# Patient Record
Sex: Female | Born: 1970
Health system: Southern US, Community
[De-identification: ages and names within clinical notes are randomized; demographics above are authoritative.]

## PROBLEM LIST (undated history)

## (undated) ENCOUNTER — Ambulatory Visit: Admission: EM | Payer: BC Managed Care – PPO

## (undated) DIAGNOSIS — E785 Hyperlipidemia, unspecified: Secondary | ICD-10-CM

## (undated) DIAGNOSIS — U071 COVID-19: Secondary | ICD-10-CM

## (undated) DIAGNOSIS — I10 Essential (primary) hypertension: Secondary | ICD-10-CM

## (undated) DIAGNOSIS — J189 Pneumonia, unspecified organism: Secondary | ICD-10-CM

## (undated) DIAGNOSIS — E119 Type 2 diabetes mellitus without complications: Secondary | ICD-10-CM

## (undated) DIAGNOSIS — K859 Acute pancreatitis without necrosis or infection, unspecified: Secondary | ICD-10-CM

## (undated) DIAGNOSIS — E079 Disorder of thyroid, unspecified: Secondary | ICD-10-CM

## (undated) DIAGNOSIS — K8020 Calculus of gallbladder without cholecystitis without obstruction: Secondary | ICD-10-CM

## (undated) DIAGNOSIS — H269 Unspecified cataract: Secondary | ICD-10-CM

## (undated) DIAGNOSIS — M1990 Unspecified osteoarthritis, unspecified site: Secondary | ICD-10-CM

## (undated) DIAGNOSIS — IMO0002 Reserved for concepts with insufficient information to code with codable children: Secondary | ICD-10-CM

## (undated) DIAGNOSIS — T7840XA Allergy, unspecified, initial encounter: Secondary | ICD-10-CM

## (undated) HISTORY — PX: CHOLECYSTECTOMY: SHX55

## (undated) HISTORY — DX: Allergy, unspecified, initial encounter: T78.40XA

## (undated) HISTORY — DX: Essential (primary) hypertension: I10

## (undated) HISTORY — DX: Unspecified osteoarthritis, unspecified site: M19.90

## (undated) HISTORY — DX: Hyperlipidemia, unspecified: E78.5

## (undated) HISTORY — DX: Disorder of thyroid, unspecified: E07.9

## (undated) HISTORY — PX: CERVICAL SPINE SURGERY: SHX589

## (undated) HISTORY — DX: Calculus of gallbladder without cholecystitis without obstruction: K80.20

## (undated) HISTORY — DX: Unspecified cataract: H26.9

## (undated) HISTORY — DX: Type 2 diabetes mellitus without complications: E11.9

## (undated) HISTORY — PX: HIP ARTHROSCOPY: SUR88

## (undated) HISTORY — DX: Acute pancreatitis without necrosis or infection, unspecified: K85.90

## (undated) HISTORY — DX: Pneumonia, unspecified organism: J18.9

## (undated) HISTORY — DX: Reserved for concepts with insufficient information to code with codable children: IMO0002

---
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
## 2000-11-21 ENCOUNTER — Other Ambulatory Visit: Admission: RE | Admit: 2000-11-21 | Discharge: 2000-11-21 | Payer: Self-pay | Admitting: Obstetrics and Gynecology

## 2002-01-23 ENCOUNTER — Other Ambulatory Visit: Admission: RE | Admit: 2002-01-23 | Discharge: 2002-01-23 | Payer: Self-pay | Admitting: Obstetrics and Gynecology

## 2003-04-04 ENCOUNTER — Other Ambulatory Visit: Admission: RE | Admit: 2003-04-04 | Discharge: 2003-04-04 | Payer: Self-pay | Admitting: Obstetrics and Gynecology

## 2003-09-20 HISTORY — PX: CHOLECYSTECTOMY: SHX55

## 2004-04-11 ENCOUNTER — Emergency Department (HOSPITAL_COMMUNITY): Admission: EM | Admit: 2004-04-11 | Discharge: 2004-04-11 | Payer: Self-pay | Admitting: Emergency Medicine

## 2004-04-15 ENCOUNTER — Encounter (INDEPENDENT_AMBULATORY_CARE_PROVIDER_SITE_OTHER): Payer: Self-pay | Admitting: *Deleted

## 2004-04-15 ENCOUNTER — Observation Stay (HOSPITAL_COMMUNITY): Admission: RE | Admit: 2004-04-15 | Discharge: 2004-04-15 | Payer: Self-pay | Admitting: General Surgery

## 2004-05-26 ENCOUNTER — Other Ambulatory Visit: Admission: RE | Admit: 2004-05-26 | Discharge: 2004-05-26 | Payer: Self-pay | Admitting: Obstetrics and Gynecology

## 2005-07-29 ENCOUNTER — Other Ambulatory Visit: Admission: RE | Admit: 2005-07-29 | Discharge: 2005-07-29 | Payer: Self-pay | Admitting: Obstetrics and Gynecology

## 2006-06-27 ENCOUNTER — Ambulatory Visit: Payer: Self-pay | Admitting: Cardiology

## 2006-07-14 ENCOUNTER — Encounter: Payer: Self-pay | Admitting: Internal Medicine

## 2006-07-14 ENCOUNTER — Ambulatory Visit: Payer: Self-pay

## 2006-07-14 ENCOUNTER — Ambulatory Visit: Payer: Self-pay | Admitting: Cardiology

## 2006-08-09 ENCOUNTER — Ambulatory Visit: Payer: Self-pay | Admitting: Cardiology

## 2006-09-21 ENCOUNTER — Encounter: Admission: RE | Admit: 2006-09-21 | Discharge: 2006-09-21 | Payer: Self-pay | Admitting: Obstetrics and Gynecology

## 2006-09-28 ENCOUNTER — Ambulatory Visit: Payer: Self-pay | Admitting: Cardiology

## 2006-11-13 ENCOUNTER — Inpatient Hospital Stay (HOSPITAL_COMMUNITY): Admission: AD | Admit: 2006-11-13 | Discharge: 2006-11-16 | Payer: Self-pay | Admitting: Obstetrics and Gynecology

## 2006-11-17 ENCOUNTER — Encounter: Admission: RE | Admit: 2006-11-17 | Discharge: 2006-12-15 | Payer: Self-pay | Admitting: Obstetrics and Gynecology

## 2006-12-16 ENCOUNTER — Encounter: Admission: RE | Admit: 2006-12-16 | Discharge: 2007-01-11 | Payer: Self-pay | Admitting: Obstetrics and Gynecology

## 2007-03-09 ENCOUNTER — Ambulatory Visit: Payer: Self-pay | Admitting: Cardiology

## 2009-06-02 ENCOUNTER — Encounter: Admission: RE | Admit: 2009-06-02 | Discharge: 2009-06-02 | Payer: Self-pay | Admitting: Obstetrics and Gynecology

## 2009-11-20 ENCOUNTER — Encounter: Admission: RE | Admit: 2009-11-20 | Discharge: 2009-11-20 | Payer: Self-pay | Admitting: Orthopedic Surgery

## 2010-09-01 ENCOUNTER — Encounter
Admission: RE | Admit: 2010-09-01 | Discharge: 2010-09-01 | Payer: Self-pay | Source: Home / Self Care | Attending: Internal Medicine | Admitting: Internal Medicine

## 2011-02-01 NOTE — Assessment & Plan Note (Signed)
[facility]                            CARDIOLOGY OFFICE NOTE   Jessica Chang, Jessica Chang                       MRN:          [id number]  DATE:03/09/2007                            DOB:          July 12, 1971    PRIMARY CARE PHYSICIAN:  Sigmund Hazel.   REASON FOR PRESENTATION:  Evaluate patient with palpitations and  hypertension.   HISTORY OF PRESENT ILLNESS:  The patient is 40 years old.  She was  referred to me initially with  premature ventricular contractions with  hypertension.  She was pregnant at the time.  She has since delivered a  baby who is doing well after some initial complications.  She actually  has been doing okay although she is forgetting to take her methyldopa 3  times a day.  She is taking it about twice a day.  She has had no  problems with blood pressure, either during the delivery or post partum.  She is not having any palpitations.  She is exercising.  She denies any  chest pain or shortness of breath.   PAST MEDICAL HISTORY:  1. Hypertension.  2. Arthroscopic surgery.  3. Tonsillectomy.  4. Cholecystectomy.   ALLERGIES:  None.   MEDICATIONS:  1. Prenatal vitamins.  2. Methyldopa 500 mg b.i.d.  3. Nova Ring.  4. Zoloft.   REVIEW OF SYSTEMS:  As stated in the HPI and otherwise negative for  other systems.   PHYSICAL EXAMINATION:  Patient is in no distress.  Blood pressure 152/98, heart rate 94 and regular, weight 208 pounds.  HEENT:  Eyelids unremarkable, pupils are equal, round, and reactive to  light, fundi not visualized.  Oral mucosa unremarkable.  NECK:  No jugular venous distension at 45 degrees.  LUNGS:  Clear to auscultation bilaterally.  HEART:  PMI not displaced or sustained.  S1 and S2 are within normal  limits.  No S3, no S4.  No clicks, no rubs, no murmurs.  ABDOMEN:  Flat, positive bowel sounds, normal in frequency and pitch.  No bruits, no rebound, no guarding.  No midline pulsatile mass.  No  organomegaly.  SKIN:  No rashes, no nodules.  EXTREMITIES:  2+ pulses, no edema.   EKG sinus rhythm, leftward axis, intervals within normal limits, no  acute ST-wave changes.   ASSESSMENT AND PLAN:  1. Hypertension:  Blood pressure can be controlled now back with her      HCT/Lisinopril 20/12.5.  She will discontinue the methyldopa.  She      tolerated this medicine well before and it worked.  She is not      going to get pregnant and understands that she should not while      taking this drug.  2. Palpitations:  She is not having any further significant symptoms.      These will be treated symptomatically.  3. Followup:  She can follow up with her primary care doctor.  She is      invited to come back with any specific questions at any time.     Rollene Rotunda, MD, [facility]  Electronically Signed  JH/MedQ  DD: 03/09/2007  DT: 03/09/2007  Job #: [id number]   cc:   Sigmund Hazel, M.D.

## 2011-02-04 NOTE — Assessment & Plan Note (Signed)
[facility]                            CARDIOLOGY OFFICE NOTE   ANALUCIA, HUSH                       MRN:          [id number]  DATE:09/28/2006                            DOB:          07/19/1971    The primary is Zelphia Cairo, MD   REASON FOR PRESENTATION:  Evaluate patient with palpitations and  hypertension.   HISTORY OF PRESENT ILLNESS:  The patient is now in her 30th week of her  intrauterine pregnancy.  She has had some problems with hypertension.  She had a blood pressure cuff.  Today we compared it and they have run a  little bit high compared to our manual readings.  Reviewing her  readings, she has typically 150s or 140s over 80s or 90s.  She has heart  rates of 100 or so but no significant sustained tachypalpitations.  She  is feeling the palpitations occasionally but otherwise feels relatively  well.  She has had some lower extremity edema.  She has had some dyspnea  with exertion.  She has had no chest pain, presyncope or syncope.  She  did have a renal ultrasound that demonstrated no significant  abnormalities with questionable mild mid-renal artery stenosis.  Her EF  was normal on echocardiogram and there were no significant  abnormalities.  She wore a Holter demonstrating normal sinus rhythm with  sinus tachycardia and PACs.   PAST MEDICAL HISTORY:  Arthroscopic surgery, tonsillectomy,  cholecystectomy, hypertension.   ALLERGIES:  None.   MEDICATIONS:  Prenatal vitamin, methyldopa 500 mg t.i.d.   REVIEW OF SYSTEMS:  As stated in the HPI and otherwise negative for  other systems.   PHYSICAL EXAMINATION:  GENERAL:  The patient is in no distress.  VITAL SIGNS:  Blood pressure 134/89, heart rate 84 and regular, weight  221 pounds.  HEENT:  Eyelids unremarkable.  Pupils equal, round, and reactive to  light.  Fundi not visualized.  Oral mucosa unremarkable.  NECK:  No jugular venous distention at 45 degrees.  Carotid  upstroke  brisk and symmetric, no bruits, no thyromegaly.  LYMPHATIC:  No adenopathy.  LUNGS:  Clear to auscultation bilaterally.  BACK:  No costovertebral angle tenderness.  CHEST:  Unremarkable.  HEART:  PMI not displaced or sustained, S1 and S2 within normal limits,  no S3, no S4, no murmurs.  ABDOMEN:  Gravid, positive bowel sounds, normal in frequency and pitch.  No bruits, rebound, guarding, no midline pulsatile mass, no  organomegaly.  SKIN:  No rashes, no nodules.  EXTREMITIES:  2+ pulses, moderate bilateral lower extremity edema, no  calf swelling or tenderness.  NEUROLOGIC:  Grossly intact throughout.   ASSESSMENT AND PLAN:  1. Hypertension.  The patient's blood pressure is mildly elevated.      However, I do not think we need further treatment of this at this      level.  It is overestimated by her home blood pressure cuff and is      within acceptable limits here in the office and at a recent visit      at another office.  Therefore,  she will continue the methyldopa.  2. Palpitations.  These are not particularly symptomatic.  These can      be followed clinically.  3. Lower extremity swelling.  We discussed keeping her feet elevated      and compression stockings, which she is wearing.  We discussed salt      and fluid restriction.  4. Follow-up.  I would like to see her after her delivery to see if      her blood pressure comes down and maybe reevaluate her renals in      another way when she is not pregnant.  Would also be available if      she has any problems prior to delivery or at the time of delivery.     Rollene Rotunda, MD, [facility]  Electronically Signed    JH/MedQ  DD: 09/28/2006  DT: 09/29/2006  Job #: [id number]   cc:   Zelphia Cairo, MD

## 2011-02-04 NOTE — Discharge Summary (Signed)
NAME:  Jessica Chang, Jessica Chang NO.:  [id number]   MEDICAL RECORD NO.:  [id number]          PATIENT TYPE:  INP   LOCATION:  9131                          FACILITY:  [facility]   PHYSICIAN:  Freddy Finner, M.D.   DATE OF BIRTH:  Feb 15, 1971   DATE OF ADMISSION:  11/13/2006  DATE OF DISCHARGE:  11/16/2006                               DISCHARGE SUMMARY   ADMITTING DIAGNOSES:  1. Intrauterine pregnancy at 37 weeks estimated gestational age.  2. Induction of labor secondary to chronic hypertension, gestational      diabetes and oligohydramnios.   DISCHARGE DIAGNOSES:  1. Status post low transverse cesarean section secondary to failure to      progress.  2. Viable female infant.   PROCEDURE:  Primary low transverse cesarean section.   REASON FOR ADMISSION:  Please see dictated H and P.   HOSPITAL COURSE:  The patient is a 40 year old female that was admitted  to [facility] for induction of labor secondary to  chronic hypertension, gestational diabetes and oligohydramnios.  PIH  labs were drawn on admission which were within normal limits.  Blood  pressure was 150/100.  Fetal heart tones were reactive.  On the  following morning cervix was reexamined and found to be 2 cm, 80%  effaced, vertex at a -1 station.  Artificial rupture of membranes was  performed which revealed no fluid.  The patient continued on Pitocin and  ampicillin for coverage for group B beta strep.  Epidural was placed for  the patient's comfort.  Later that evening the patient was in adequate  labor pattern.  However, cervix was unchanged.  Decision was made to  proceed with a primary low transverse cesarean section.  The patient was  then transferred to the operating room where epidural was dosed to an  adequate surgical level.  A low transverse incision was made with  delivery of a viable female infant weighing 7 pounds 5 ounces, Apgar's  of 7 at 1 minute and 8 at 5 minutes.  The patient  tolerated the  procedure well and was taken to the recovery room in stable condition.  Postoperative day #1 the patient was without complaint.  Baby was in the  NICU for sugar control.  Vital signs were stable.  She was afebrile.  Blood pressure 137/86.  Abdomen soft with good return of bowel function.  Fundus was firm and nontender.  Abdominal dressing was noted to be  clean, dry and intact.  Laboratory findings revealed hemoglobin of 9.1,  platelet count of 177,000, WBC count of 12.8.  Fasting blood sugar was  178 mg/dL, however, the patient had eaten several hours prior to fasting  blood sugar.  On the following morning, the patient was without  complaint other than she had noticed some right shoulder pain without  paresthesia.  Baby continued to be in the NICU, was now being monitored  for abdominal distention.  Vital signs were stable.  Blood pressure  130/75.  Abdomen soft.  Fundus firm and nontender.  Incision was clean,  dry and intact.  Fasting blood sugar was  96 mg/dL and 2-hour  postprandial was 122 to 148.  Later that evening the infant had been  transported to the [facility] for perforated bowel and the patient  did desire early discharge.  Discharge instructions reviewed and the  patient was later discharged home.   CONDITION ON DISCHARGE:  Stable.   DIET:  Regular as tolerated.   ACTIVITY:  No heavy lifting, no driving x2 weeks, no vaginal entry.   FOLLOW UP:  Patient to follow up in 1 week for an incision check.  She  is to call for temperature greater than 100 degrees, persistent nausea,  vomiting, heavy vaginal bleeding and/or redness or drainage from  incisional site.  The patient was also instructed call for headache,  blurred vision or right upper quadrant pain.   DISCHARGE MEDICATIONS:  1. Percocet 5/325 #60 with 1 additional refill 1 p.o. every 4 hours.  2. Motrin 600 mg every 6 hours.  3. Aldomet 500 mg 1 p.o. t.i.d.  4. Xanax 0.25 1 p.o. t.i.d. #30  with 3 additional refills.  5. Prenatal vitamins 1 p.o. daily.      Julio Sicks, N.P.      Freddy Finner, M.D.  Electronically Signed    CC/MEDQ  D:  12/18/2006  T:  12/18/2006  Job:  [id number]

## 2011-02-04 NOTE — Op Note (Signed)
NAME:  Jessica, Chang NO.:  [id number]   MEDICAL RECORD NO.:  [id number]                   PATIENT TYPE:  OBV   LOCATION:  0466                                 FACILITY:  [facility]   PHYSICIAN:  Timothy E. Earlene Plater, M.D.              DATE OF BIRTH:  1971-09-17   DATE OF PROCEDURE:  04/15/2004  DATE OF DISCHARGE:                                 OPERATIVE REPORT   PREOPERATIVE DIAGNOSIS:  Cholecystolithiasis.   POSTOPERATIVE DIAGNOSIS:  Cholecystolithiasis, acute versus subacute.   OPERATIVE PROCEDURE:  Laparoscopic cholecystectomy and operative  cholangiogram.   SURGEON:  Timothy E. Earlene Plater, M.D.   ASSISTANT:  Currie Paris, M.D.   ANESTHESIA:  General.   Jessica Chang is 6, otherwise healthy.  Was in the emergency room this  weekend.  I saw her Monday.  Scheduled her surgery urgently, at her request,  for symptomatic cholecystolithiasis.  Her laboratory data is normal at this  time, and except for controlled hypertension, she is well.  She was  identified, and the permit signed.   Patient was taken to the operating room and placed supine.  General  endotracheal anesthesia administered.  PAS hose applied.  Orogastric tube.  The abdomen was prepped and draped in the usual fashion.  Marcaine 0.5% with  epinephrine was used before each incision.  An infraumbilical incision made.  The fascia identified.  Opened in the midline.  The peritoneum entered  without complication.  A suture placed.  Hasson catheter placed.  Tied in  place with the suture.  The abdomen was insufflated.  General peritoneoscopy  was unremarkable with omentum covering the entire bowel into the pelvis.  The gallbladder appeared thickened and distended.  A second 10 mm trocar  placed in the mid epigastrium and two 5 mm trocars in the right upper  quadrant.  The gallbladder was grasped, placed on tension, and indeed, there  appeared to be a stone impacted in the ampulla.  Careful  dissection at the  base of the gallbladder revealed a normal-appearing but small cystic duct.  This was dissected out completely, a window made.  The clip was placed on  the gallbladder side of the cystic duct.  The cystic duct opened.  Using the  percutaneously placed cholangiogram into the cystic duct stump, a real-time  cholangiogram was made showing complete filling of the biliary tree and free  flow into the duodenum.  We considered this normal.  The clip and catheter  were removed.  The stump and cystic duct doubly clipped and divided.  Prominent artery entering the anterior side of the gallbladder was isolated,  triply clipped and divided.  A posterior artery was likewise clipped and  divided.  The gallbladder was removed from the gallbladder bed without  incident or complication.  The bed was dry.  Irrigation was clear.  The  gallbladder was brought through the infraumbilical incision.  The stone was  quite large.  We opened a gallbladder.  There was a mucopurulent bowel  within the gallbladder.  This was quickly suctioned away.  The stone was  crushed and then delivered through the infraumbilical incision and passed  off the field from pathology.  That wound was inspected, tied under direct  vision, and then irrigated.  Irrigation of the gallbladder bed was then  cleared.  All  irrigation sealed.  Two instruments and the trocar were removed under direct  vision.  All trocar sites inspected.  Each was closed with 3-0 Monocryl,  Steri-Strips, and dry sterile dressing.  Counts were correct.  She tolerated  it well.  Was awakened and taken to the recovery room in good condition.                                               Timothy E. Earlene Plater, M.D.    TED/MEDQ  D:  04/15/2004  T:  04/15/2004  Job:  [id number]   cc:   Sigmund Hazel, M.D.  [address]  [address] [place], [place] [address]  Fax: [phone]

## 2011-02-04 NOTE — Op Note (Signed)
NAME:  Jessica Chang, Jessica Chang              ACCOUNT NO.:  [id number]   MEDICAL RECORD NO.:  [id number]          PATIENT TYPE:  INP   LOCATION:  9169                          FACILITY:  [facility]   PHYSICIAN:  Michelle L. Grewal, M.D.DATE OF BIRTH:  February 20, 1971   DATE OF PROCEDURE:  11/14/2006  DATE OF DISCHARGE:                               OPERATIVE REPORT   PREOPERATIVE DIAGNOSIS:  Intrauterine pregnancy at 36 weeks,  oligohydramnios, gestational diabetes, chronic hypertension, and failure  to progress.   POSTOPERATIVE DIAGNOSIS:  Intrauterine pregnancy at 36 weeks,  oligohydramnios, gestational diabetes, chronic hypertension, and failure  to progress.   PROCEDURE:  Primary low transverse cesarean section.   SURGEON:  Michelle L. Vincente Poli, M.D.   ANESTHESIA:  Epidural.   SPECIMENS:  Female infant, cephalic presentation in OP position, Apgars  9 at 1 minute and 9 at 5 minutes.   PROCEDURE:  The patient was taken to the operating room.  Her epidural  was dosed and found to be adequate.  She was then prepped and draped.  A  Foley catheter had been inserted while on labor and delivery.  A low  transverse incision was made, carried down to the fascia.  The fascia  was scored in the midline and extended laterally.  The rectus muscles  were separated in the midline.  The peritoneum was entered sharply.  The  peritoneal incision was then stretched.  The bladder blade was inserted.  The lower uterine segment was identified and the bladder flap was  created sharply and then digitally.  The bladder blade was readjusted.  A low transverse incision was made in the uterus.  The uterus was  entered using a hemostat.  Amniotic fluid was clear.  The baby was in  cephalic presentation and was delivered without any difficulty, was  delivered with the gentle assistance of a vacuum with one gentle pull  with no pop-off.  The baby was in OP position, was female infant, Apgars  9 at 1 minute and 9 at 5 minutes.   The cord was clamped and cut.  The  baby was handed to the awaiting neonatologist and subsequently taken to  the newborn nursery.  The placenta was manually removed, noted to be  normal and intact, with three vessel cord.  Pitocin and antibiotics were  given. The uterine incision was closed in one layer using 0 chromic  continuous running locked stitch.  Irrigation was performed.  Hemostasis  was excellent.  The peritoneum was closed with 0 Vicryl and the rectus  muscles were reapproximated, as well.  The fascia was closed using 0  Vicryl running stitch, starting in each corner and meeting in the  midline. After irrigation of subcutaneous layer, the skin was closed  with staples.  All sponge, lap and instrument counts were correct x2.  The patient went to recovery room in stable condition.      Michelle L. Vincente Poli, M.D.  Electronically Signed    MLG/MEDQ  D:  11/14/2006  T:  11/14/2006  Job:  [id number]

## 2011-02-04 NOTE — Assessment & Plan Note (Signed)
[facility]                              CARDIOLOGY OFFICE NOTE   Jessica Chang, Jessica Chang                       MRN:          [id number]  DATE:06/27/2006                            DOB:          06-Oct-1970    REASON FOR REFERRAL:  Evaluate the patient's palpitations and hypertension.   HISTORY OF PRESENT ILLNESS:  The patient is a pleasant 40 year old white  female who is in the 17th week of her first pregnancy.  She has had  hypertension diagnosed in 2004.  It was 170/110 when it was diagnosed.  She  was treated with ACE inhibitors until earlier this year when she came off of  those in anticipation of getting pregnant.  She has since been treated with  methyldopa.  During her pregnancy she has had increasing problems with her  blood pressure and has had the methyldopa increased.  Typically runs in the  120s to 140s over 90s.  She reports having had some blood work recently and  thinks she has had this evaluation of her blood pressure previously.   She also describes palpitations.  She has noticed strong and rapid heart  beat.  The other day she was standing comfortably talking to somebody when  her heart rate increased to 104.  She has not had presyncope or syncope  associated with this.  She had had no chest discomfort, neck discomfort, nor  arm discomfort.  No activity-induced nausea or vomiting or excessive  diaphoresis.  She has had no presyncope or syncope.  She has no new  shortness of breath.  Denies any PND or orthopnea.  She has gained about 9  pounds.  She has had some mild swelling in her hands  and feet.   PAST MEDICAL HISTORY:  Hypertension since 2004.   PAST SURGICAL HISTORY:  1. Knee arthroscopy.  2. Tonsillectomy.  3. Cholecystectomy.   ALLERGIES:  NONE.   MEDICATIONS:  1. Methyldopa 500 mg t.i.d.  2. Prenatal vitamin.   SOCIAL HISTORY:  The patient smokes about 1/4 pack cigarettes for 8-10  years, quitting at the onset of  pregnancy.  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  She is married.  This is her first pregnancy.   FAMILY HISTORY:  Interesting for her mother having a cardiomyopathy,  apparently viral.  This was recent and seems to have resolved.  She  otherwise has no history of difficulty controlling hypertension, sudden  cardiac death, syncope, cardiomyopathy, early coronary disease.   REVIEW OF SYSTEMS:  As stated in the HPI and positive for some chest  discomfort that has been sharp and shooting back through her right shoulder  infrequently.  She has had no flushing, hot spells, or other symptoms.   PHYSICAL EXAMINATION:  GENERAL:  The patient is pleasant and in no distress.  VITAL SIGNS:  Blood pressure 152/98.  Heart rate 94 and regular.  Body mass  index 33.  Weight 208 pounds.  HEENT:  Eyes: Unremarkable, pupils equal round and react to light.  Fundi  within normal  limits.  Oral mucosa unremarkable.  NECK:  No jugular venous distention.  Wave form within normal limits.  Carotid upstroke brisk and symmetrical.  No bruits.  No thyromegaly.  LYMPHATICS:  No cervical, axillary, or inguinal adenopathy.  LUNGS:  Clear to auscultation bilaterally.  BACK:  No costovertebral angle tenderness.  CHEST:  Unremarkable.  HEART:  PMI not displaced or sustained.  S1 and S2 within normal limits.  No  S3, no S4.  A 2/6 apical systolic murmur, brief and radiating slightly at  the aortic output tract.  No diastolic murmurs.  ABDOMEN:  Flat, positive bowel sounds.  Normal in frequency and pitch.  No  bruits, no guarding, no midline pulse.  No mass or hepatosplenomegaly.  SKIN:  No rashes or nodules.  EXTREMITIES:  Pulses 2+ throughout, no edema.  No cyanosis, no clubbing.  NEURO:  Oriented to person, place, and time.  Cranial nerves II through XII  grossly intact.  Motor grossly intact.   EKG:  Sinus rhythm.  Rate 94.  Axis within normal limits.  Intervals  within  normal limits.  No acute ST/T wave changes.   ASSESSMENT AND PLAN:  1. Palpitations.  The patient is describing a tachy-arrhythmia with a      rather sudden onset.  It can happen at rest.  I am going to try to get      this on a 24-hour monitor.  I am going to make sure that she has had      recent chemistry and TSH.  I am also going to check an echocardiogram      to make sure she has a structurally normal heart.  Further evaluation      and treatment will be based on these results.  2. Hypertension.  Blood pressure will probably be more difficult to      control.  I agree with the methyldopa and we can go up on this dose,      particularly to q.i.d. dosing as the next step if her blood pressure is      more of a problem.  I told her to start avoiding salt.  She has had      this hypertension for a while and I am going to check to make sure she      has had a UA and also order a renal ultrasound in lieu of a CT scan.  I      am going to make sure she does not have      any evidence of renal artery stenosis with fibromuscular dysplasia.  3. Followup will be based on the results of the above and further      symptoms.            ______________________________  Rollene Rotunda, MD, [facility]   JH/MedQ DD:  06/27/2006 DT:  06/28/2006 Job #:  [id number]   cc:   Zelphia Cairo, MD

## 2011-02-04 NOTE — Assessment & Plan Note (Signed)
[facility]                              CARDIOLOGY OFFICE NOTE   JUDI, JAFFE                       MRN:          [id number]  DATE:08/09/2006                            DOB:          1971/07/08    REASON FOR PRESENTATION:  Evaluate patient with palpitations and  hypertension.   HISTORY OF PRESENT ILLNESS:  This is a second visit for this pleasant  patient who is now in her 26th week of intrauterine pregnancy.  She has had  problems with hypertension and is currently being managed with methyldopa.  It has been a little bit elevated recently, although it seems to fluctuate.  She has had readings of 150s/high 90s.  However, at other times, it is  120s/70s.  She has not had as many palpitations as she had previously.  She  has had some increasing swelling and took to wearing support hose.  She has  not had any med changes.  She did have an echocardiogram, which was normal.  She had renal artery Doppler that suggested some mild mid renal artery  stenosis, questionable fibromuscular dysplasia but mild at 1-59%.  This  would not explain her difficult-to-control hypertension.  She had a Holter  monitor that demonstrated normal sinus rhythm, sinus tachycardia, and PACs.   She says she feels relatively well aside from the swelling.  She gets  occasional mild shortness of breath at rest but is able to work full time  and has not been particularly limited.   PAST MEDICAL HISTORY:  1. Arthroscopic surgery.  2. Tonsillectomy.  3. Cholecystectomy.  4. Hypertension since 2004.   ALLERGIES:  None.   CURRENT MEDICATIONS:  Prenatal vitamins and methyldopa 500 mg t.i.d.   REVIEW OF SYSTEMS:  As stated in the HPI and otherwise negative for other  systems.   PHYSICAL EXAMINATION:  GENERAL:  Patient is in no distress.  VITAL SIGNS:  Weight 216 pounds (Body Mass Index 33).  Blood pressure  150/96, heart rate 90 and regular.  HEENT:  Eyelids  unremarkable.  Pupils are equal, round and reactive to  light.  Fundi not visualized.  Oral mucosa unremarkable.  NECK:  No jugular venous distention.  Wave form within normal limits.  Carotid upstroke brisk and symmetric.  No bruits, no thyromegaly.  LYMPHATICS:  No cervical, axillary, or inguinal adenopathy.  LUNGS:  Clear to auscultation bilaterally.  BACK:  No costovertebral angle tenderness.  CHEST:  Unremarkable.  HEART:  PMI not displaced or sustained.  S1 and S2 within normal limits.  No  S3, no S4, no murmurs.  ABDOMEN:  Gravid, positive bowel sounds without rebound or guarding.  SKIN:  No rashes, no nodules.  EXTREMITIES:  Pulses 2+.  Mild bilateral lower extremity edema.  NEUROLOGIC:  Grossly intact.   ASSESSMENT/PLAN:  1. Hypertension:  Blood pressure is elevated.  At this point, I do not      think we need to make a medication adjustment, although I am going to      ask her to buy a blood pressure cuff and keep a blood pressure  diary.      If she runs in the 160s/100s or higher, I might make an adjustment by      adding a beta blocker.  It has been demonstrated in previous      observational trials that there is no clinical advantage to mom or baby      with tight blood pressure during pregnancy.  We want to keep it from      spiking or being sustained at a dangerous level.  However, I do not      believe that she has reached that.  We had a long discussion about      this.  I have invited her OB/GYN to give me a call so that we can      discuss the therapy going forward.  We could increase the dose of      methyldopa or more likely add a low dose beta blocker.  I  do suspect      that at some point in time, we will have to accelerate her blood      pressure medications.  In particular, I think during the latter stages      of pregnancy or delivery, we will have problems that may need to be      more aggressively treated.  2. Palpitations:  These seem to be less  problematic.  No therapy for this      is warranted at this point.  3. Followup:  I agree with the OB's in following her closely, and I would      like to see her at least every six weeks, but she will call me if she      has any increasing pressures.     Rollene Rotunda, MD, [facility]  Electronically Signed    JH/MedQ  DD: 08/09/2006  DT: 08/09/2006  Job #: [id number]   cc:   Zelphia Cairo, MD

## 2012-03-02 ENCOUNTER — Other Ambulatory Visit: Payer: Self-pay | Admitting: Dermatology

## 2012-03-29 ENCOUNTER — Other Ambulatory Visit: Payer: Self-pay | Admitting: Dermatology

## 2012-10-29 DIAGNOSIS — E1169 Type 2 diabetes mellitus with other specified complication: Secondary | ICD-10-CM | POA: Insufficient documentation

## 2012-10-29 DIAGNOSIS — E1159 Type 2 diabetes mellitus with other circulatory complications: Secondary | ICD-10-CM | POA: Insufficient documentation

## 2012-10-29 DIAGNOSIS — I10 Essential (primary) hypertension: Secondary | ICD-10-CM

## 2012-10-29 DIAGNOSIS — E785 Hyperlipidemia, unspecified: Secondary | ICD-10-CM

## 2012-10-29 DIAGNOSIS — E119 Type 2 diabetes mellitus without complications: Secondary | ICD-10-CM | POA: Insufficient documentation

## 2012-12-04 ENCOUNTER — Ambulatory Visit (INDEPENDENT_AMBULATORY_CARE_PROVIDER_SITE_OTHER): Payer: BC Managed Care – PPO | Admitting: Neurology

## 2012-12-04 ENCOUNTER — Encounter: Payer: Self-pay | Admitting: Neurology

## 2012-12-04 VITALS — BP 111/75 | HR 80 | Ht 67.0 in | Wt 186.0 lb

## 2012-12-04 DIAGNOSIS — R252 Cramp and spasm: Secondary | ICD-10-CM

## 2012-12-04 NOTE — Progress Notes (Signed)
History: Wynona Canes is a 42 years old right-handed female, referred by Dr. Pola Corn for evaluation of muscle cramping  She had a past medical history of hyperlipidemia, hypertension, also diabetes, she was born with right hip abnormality, underwent right hip femoroplasty labral repair 2011, now doing well, exercise regularly,  She works as of Event organiser for [employer], over the past 2 years, she began to notice bilateral calf muscle cramping, right worse than left, a few times at night, she has to get up and standing up pacing around to relax her muscle cramping, sometimes she has difficulty opening a jar, occasionally difficulty lifting heavy objects, getting worse over the past 6 months, he could remember episode to the point that she has difficulty keeping her hands on the stair and will because upper extremity on fire, burning type sensation, S.  Laboratory evaluation showed elevated ALT 106, AST 51, elevated glucose 135, otherwise normal CMP, CBC,  She reports overall general deconditioning, she was able to move all heavy wooden chest with her husband into their house 3 years ago, now she can no longer move them around, she denies numbness, no difficulty walking, no low back pain no neck pain, there was frequent left trapezius biceps muscle cramping,  She has urinary urgency, stress incontinence, she denies constipation, diarrhea  Updated March 18th 2014: She was given prescription of gabapentin 300 mg 3 times a day, reported moderate improvement, much less muscle cramping, occasionally bother her distal leg, and feet, she denies sensory changes, no weakness, but complains fatigue easily, she used to be able to swim 2 miles, run 3 miles [place], she can barely do that now. She denies visual change, she denies strokelike symptoms, no gait difficulty, there was no family history of neuromuscular disease, she worries possibility of multiple sclerosis. she reported one episode of  her urine change to rusty color  Laboratory March 10th 2014 reviewed, negative or normal CMP, TSH, CPK, slight elevated aldolase, lactic acid   review of system: She complains of fatigue, swelling in her legs, constipation, joints pain, work finding difficulties, naming difficulties  PHYSICAL EXAMINATOINS:  Generalized: In no acute distress,  Neck: Supple, no carotid bruits   Cardiac: Regular rate rhythm  Pulmonary: Clear to auscultation bilaterally  Musculoskeletal: No deformity  Neurological examination  Mentation: Alert oriented to time, place, history taking, and causual conversation  Cranial nerve II-XII: Pupils were equal round reactive to light extraocular movements were full, visual field were full on confrontational test. facial sensation and strength were normal. hearing was intact to finger rubbing bilaterally. Uvula tongue midline.  head turning and shoulder shrug and were normal and symmetric.Tongue protrusion into cheek strength was normal.  Motor: normal tone, bulk and strength.  Sensory: Intact to fine touch, pinprick, preserved vibratory sensation, and proprioception at toes.  Coordination: Normal finger to nose, heel-to-shin bilaterally there was no truncal ataxia  Gait: Rising up from seated position without assistance, normal stance, without trunk ataxia, moderate stride, good arm swing, smooth turning, able to perform tiptoe, and heel walking without difficulty.   Romberg signs: Negative  Deep tendon reflexes: Brachioradialis 2/2, biceps 2/2, triceps 2/2, patellar 2/2, Achilles 2/2, plantar responses were flexor bilaterally.   Assessment and plan:  42 year old Caucasian female, with muscle cramping, which has improved with gabapentin treatment, transient mild abnormal CPK up to 680, but normal neurological examination, EMG,  1. Not sure exact etiology of her constellation of complaints, remote possibility including metabolic myopathy, she worries about  the possibility  of multiple sclerosis, 2, with her improving symptoms, normal neurological examination, will hold off any further evaluation at this point, if she remains symptomatic, may consider muscle biopsy, even MRI of the brain, 3, she will contact the office for continued symptoms

## 2013-03-07 ENCOUNTER — Other Ambulatory Visit: Payer: Self-pay | Admitting: Dermatology

## 2013-07-04 ENCOUNTER — Other Ambulatory Visit: Payer: Self-pay | Admitting: Obstetrics and Gynecology

## 2013-07-04 DIAGNOSIS — R928 Other abnormal and inconclusive findings on diagnostic imaging of breast: Secondary | ICD-10-CM

## 2013-07-18 ENCOUNTER — Ambulatory Visit
Admission: RE | Admit: 2013-07-18 | Discharge: 2013-07-18 | Disposition: A | Discharge status: Home / Self Care | Payer: BC Managed Care – PPO | Source: Ambulatory Visit | Attending: Obstetrics and Gynecology | Admitting: Obstetrics and Gynecology

## 2013-07-18 DIAGNOSIS — R928 Other abnormal and inconclusive findings on diagnostic imaging of breast: Secondary | ICD-10-CM

## 2013-08-13 DIAGNOSIS — R252 Cramp and spasm: Secondary | ICD-10-CM | POA: Insufficient documentation

## 2013-08-26 LAB — LIPID PANEL
CHOLESTEROL: 146 mg/dL (ref 0–200)
HDL: 37 mg/dL (ref 35–70)
LDL Cholesterol: 62 mg/dL
TRIGLYCERIDES: 235 mg/dL — AB (ref 40–160)

## 2013-08-26 LAB — HEPATIC FUNCTION PANEL: AST: 48 U/L — AB (ref 13–35)

## 2013-08-26 LAB — BASIC METABOLIC PANEL: CREATININE: 0.8 mg/dL (ref <=1.1)

## 2013-08-26 LAB — MICROALBUMIN, URINE: Microalb, Ur: 11

## 2013-12-16 ENCOUNTER — Other Ambulatory Visit: Payer: Self-pay | Admitting: Obstetrics and Gynecology

## 2013-12-16 DIAGNOSIS — N6320 Unspecified lump in the left breast, unspecified quadrant: Secondary | ICD-10-CM

## 2014-01-16 ENCOUNTER — Ambulatory Visit
Admission: RE | Admit: 2014-01-16 | Discharge: 2014-01-16 | Disposition: A | Discharge status: Home / Self Care | Payer: BC Managed Care – PPO | Source: Ambulatory Visit | Attending: Obstetrics and Gynecology | Admitting: Obstetrics and Gynecology

## 2014-01-16 DIAGNOSIS — N6320 Unspecified lump in the left breast, unspecified quadrant: Secondary | ICD-10-CM

## 2014-03-10 ENCOUNTER — Other Ambulatory Visit: Payer: Self-pay | Admitting: Dermatology

## 2014-06-19 ENCOUNTER — Other Ambulatory Visit: Payer: Self-pay | Admitting: Obstetrics and Gynecology

## 2014-06-19 DIAGNOSIS — N63 Unspecified lump in unspecified breast: Secondary | ICD-10-CM

## 2014-07-01 ENCOUNTER — Other Ambulatory Visit: Payer: Self-pay | Admitting: Obstetrics and Gynecology

## 2014-07-02 LAB — CYTOLOGY - PAP

## 2014-07-03 ENCOUNTER — Ambulatory Visit
Admission: RE | Admit: 2014-07-03 | Discharge: 2014-07-03 | Disposition: A | Discharge status: Home / Self Care | Payer: BC Managed Care – PPO | Source: Ambulatory Visit | Attending: Obstetrics and Gynecology | Admitting: Obstetrics and Gynecology

## 2014-07-03 ENCOUNTER — Encounter (INDEPENDENT_AMBULATORY_CARE_PROVIDER_SITE_OTHER): Payer: Self-pay

## 2014-07-03 DIAGNOSIS — N63 Unspecified lump in unspecified breast: Secondary | ICD-10-CM

## 2014-10-19 DIAGNOSIS — M76899 Other specified enthesopathies of unspecified lower limb, excluding foot: Secondary | ICD-10-CM | POA: Insufficient documentation

## 2015-06-11 ENCOUNTER — Other Ambulatory Visit: Payer: Self-pay | Admitting: Obstetrics and Gynecology

## 2015-06-11 DIAGNOSIS — N6320 Unspecified lump in the left breast, unspecified quadrant: Secondary | ICD-10-CM

## 2015-07-06 ENCOUNTER — Ambulatory Visit
Admission: RE | Admit: 2015-07-06 | Discharge: 2015-07-06 | Disposition: A | Discharge status: Home / Self Care | Payer: 59 | Source: Ambulatory Visit | Attending: Obstetrics and Gynecology | Admitting: Obstetrics and Gynecology

## 2015-07-06 DIAGNOSIS — N6320 Unspecified lump in the left breast, unspecified quadrant: Secondary | ICD-10-CM

## 2015-07-07 LAB — HM DIABETES EYE EXAM

## 2015-08-10 LAB — BASIC METABOLIC PANEL
BUN: 17 mg/dL (ref 4–21)
Creatinine: 0.8 mg/dL (ref <=1.1)
GLUCOSE: 129 mg/dL
POTASSIUM: 4.6 mmol/L (ref 3.4–5.3)
SODIUM: 135 mmol/L — AB (ref 137–147)

## 2015-08-10 LAB — TSH: TSH: 1.04 u[IU]/mL (ref <=5.90)

## 2015-10-26 LAB — TSH: TSH: 1.32 u[IU]/mL (ref <=5.90)

## 2015-10-26 LAB — BASIC METABOLIC PANEL
BUN: 16 mg/dL (ref 4–21)
GLUCOSE: 167 mg/dL
POTASSIUM: 5.1 mmol/L (ref 3.4–5.3)

## 2015-10-26 LAB — HEPATIC FUNCTION PANEL: Alkaline Phosphatase: 70 U/L (ref 25–125)

## 2015-10-26 LAB — HEMOGLOBIN A1C: Hemoglobin A1C: 8.1

## 2016-02-01 LAB — CBC AND DIFFERENTIAL
HEMATOCRIT: 44 % (ref 36–46)
Hemoglobin: 14.9 g/dL (ref 12.0–16.0)
Neutrophils Absolute: 62 /uL
Platelets: 335 10*3/uL (ref 150–399)
WBC: 11 10*3/mL

## 2016-02-18 ENCOUNTER — Encounter: Payer: Self-pay | Admitting: Family Medicine

## 2016-02-18 ENCOUNTER — Ambulatory Visit (INDEPENDENT_AMBULATORY_CARE_PROVIDER_SITE_OTHER): Payer: 59 | Admitting: Family Medicine

## 2016-02-18 VITALS — BP 115/76 | HR 87 | Ht 66.75 in | Wt 184.2 lb

## 2016-02-18 DIAGNOSIS — E119 Type 2 diabetes mellitus without complications: Secondary | ICD-10-CM

## 2016-02-18 DIAGNOSIS — E0800 Diabetes mellitus due to underlying condition with hyperosmolarity without nonketotic hyperglycemic-hyperosmolar coma (NKHHC): Secondary | ICD-10-CM

## 2016-02-18 DIAGNOSIS — I10 Essential (primary) hypertension: Secondary | ICD-10-CM

## 2016-02-18 DIAGNOSIS — L989 Disorder of the skin and subcutaneous tissue, unspecified: Secondary | ICD-10-CM

## 2016-02-18 DIAGNOSIS — R1013 Epigastric pain: Secondary | ICD-10-CM | POA: Diagnosis not present

## 2016-02-18 DIAGNOSIS — E782 Mixed hyperlipidemia: Secondary | ICD-10-CM

## 2016-02-18 DIAGNOSIS — E663 Overweight: Secondary | ICD-10-CM

## 2016-02-18 NOTE — Progress Notes (Signed)
Jessica Chang, D.O. Family Medicine Physician [facility] Location: Primary Care at [facility]     Subjective:    CC: New pt, here to establish care.   HPI: Jessica Chang is a pleasant 45 y.o. female who presents to [facility] today to review her multiple medical issues with me, and for me to get to know her better.    She has complaints of chronic constipation in which she does not take anything for it... Only occasionally takes Colace.  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 Amil Chang of rheumatology for this.  Her entire body gets muscle cramps at times and she was taking gabapentin in the past for this but did not feel it works so she stopped that on her own. She does follow up with him regularly  She has a history of diabetes which she got after her first child. She had gestational diabetes at age 7 when she had her one child, daughter Jessica Chang.  She sees Dr. Suzette Chang of endocrinology in [place] for this. Her last A1c was 8.2 and she is due for a follow-up with Dr. Redmond Chang in early June.  She gets foot exams and eye exams regularly and her last ophthalmology examination was in October 2016 by Dr. Luretha Chang.   Hypertension: Well controlled. Takes meds daily. No side effects. No chest pain, shortness of breath, visual changes, dizziness, headache or peripheral edema.  Mixed hyperlipidemia: Patient cannot recall if her cholesterol was abnormal or if she was just put on the medicine due to her diabetes by her endocrinologist.  She goes to see Dr. Kathryne Chang of OB/GYN physician for women's for her yearly exams/ mammo screenings.   She has concerns about her being overweight and at some point would like to discuss possible weight loss meds with me. She does not recall ever going to a diabetic nutritionist or weight loss nutritionist.  She also does see Dr. Redmond Chang for abdominal obesity  CC:  She complains of a history of 2-3 weeks ago having sharp pains in her right upper abdomen and epigastric region that seemed to radiate to between her shoulder blades.  It was a 10 out of 10 intensity and patient even missed multiple days of work because of it. She went to a doctor at that time and was diagnosed with food poisoning. She tells me she had blood work which was uneventful and did not point to any liver\gallbladder or other pathology.  She has not really had any problems since then but patient has concerns that it will come back and does not think it is from food poisoning. Does not seem to be related to certain foods and patient has not paid attention to whether or not it's related to her Advil usage which she occasionally takes for her generalized myalgias related to her peripheral nerve hypersensitivity syndrome..  She has not tried any medication since then because she has not had any bouts of pain. Today she denies any abdominal pain, nausea, vomiting, reflux, sore throat, cough, or bad taste in her mouth in the mornings. She denies that her symptoms get worse with certain foods and has not really paid attention to that. She denies any history of gastric ulcer or the like.    Past Medical History  Diagnosis Date  . Diabetes ([place])   . Hypertension   . Hyperlipidemia   . Thyroid disease  Past Surgical History  Procedure Laterality Date  . Hip arthroscopy N/A   . Cesarean section    . Cholecystectomy    . Knee arthrocentesis    . Tonsillectomy      Family History  Problem Relation Age of Onset  . Cancer Mother     melanoma  . Heart disease Mother   . Diabetes Father   . Hyperlipidemia Father   . Hypertension Father   . Alcohol abuse Sister   . Mental illness Sister   . Cancer Paternal Aunt     melanoma  . Heart attack Paternal Aunt   . Cancer Maternal Grandmother     breast  . AAA (abdominal aortic aneurysm) Maternal Grandfather   . Diabetes Paternal Grandmother    . Cancer Paternal Grandfather     melanoma  . Stroke Paternal Grandfather     History  Drug Use No  ,  History  Alcohol Use  . Yes    Comment: Drinks alcohol four times a month.  ,  History  Smoking status  . Former Smoker  . Quit date: 09/19/2005  Smokeless tobacco  . Never Used    Comment: Quit 2007  ,  History  Sexual Activity  . Sexual Activity: Yes  . Birth Control/ Protection: IUD    Patient's Medications  New Prescriptions   No medications on file  Previous Medications   CANAGLIFLOZIN (INVOKANA) 300 MG TABS TABLET    Take 300 mg by mouth daily before breakfast.   IBUPROFEN 200 MG CAPS    Take 600 mg by mouth as needed.    KOMBIGLYZE XR 2.01-999 MG TB24    Take 1 tablet by mouth 2 (two) times daily.   LEVOTHYROXINE (SYNTHROID, LEVOTHROID) 100 MCG TABLET    Take 1 tablet by mouth daily.   LISINOPRIL-HYDROCHLOROTHIAZIDE (PRINZIDE,ZESTORETIC) 20-12.5 MG PER TABLET    Take 1 tablet by mouth daily.   SITAGLIPTAN-METFORMIN (JANUMET) 50-500 MG PER TABLET    Take 1 tablet by mouth 2 (two) times daily with a meal.  Modified Medications   No medications on file  Discontinued Medications   DIPHENHYDRAMINE HCL (BENADRYL ALLERGY PO)    Take by mouth daily as needed.   DOXYCYCLINE (VIBRAMYCIN) 100 MG CAPSULE       GABAPENTIN (NEURONTIN) 300 MG CAPSULE    Take 300 mg by mouth 3 (three) times daily.   SIMVASTATIN PO    Take by mouth.    ALLERGIES: Review of patient's allergies indicates no known allergies.   Review of Systems: Full 14 point ROS performed via "adult medical history form".  Negative except for noted above    Objective:   Blood pressure 115/76, pulse 87, height 5' 6.75" (1.695 m), weight 184 lb 3.2 oz (83.553 kg).  Body mass index is 29.08 kg/(m^2).  General: Well Developed, well nourished, and in no acute distress.  Neuro: Alert and oriented x3, extra-ocular muscles intact, sensation grossly intact.  HEENT: Normocephalic, atraumatic, pupils equal round  reactive to light, neck supple, no gross masses, no carotid bruits, no JVD apprec Skin: no rashes, has one 1-11/2cm irregular appearing lesion on her thigh.  Cardiac: Regular rate and rhythm, no murmurs rubs or gallops.  Respiratory: Essentially clear to auscultation bilaterally. Not using accessory muscles, speaking in full sentences.  Abdominal: Soft, slightly bloated, positive bowel sounds 4 no guarding rigidity or rebound. No organomegaly. Musculoskeletal: Ambulates w/o diff, FROM * 4 ext.  Vasc: less 2 sec cap RF, warm and pink  Psych:  No HI/SI, judgement and insight good.    Impression and Recommendations:    The patient was counselled, risk factors were discussed, anticipatory guidance given.  Pt was seen in the office today for 55+ minutes, with over 50% time spent in face the face counseling and coordination of care  Diabetes [facility]) She will obtain regular labs with her endocrinologist that she follows up with in early June.    I asked patient to get me these medical records every 3 months so we can put them in her chart for our records.   Patient will determine in the future whether or not she wants to get her labs done here or at her endocrinologist's office.    Essential hypertension Controlled at current, although I told patient I prefer her blood pressure to be 130/80 or less.   Lifestyle modifications discussed with patient, DASH diet, as well as weight loss.  Continue current meds.   Mixed hyperlipidemia Continue medications. Patient will need fasting blood work in the future. She will get me these labs from her specialist.  Overweight (BMI 25.0-29.9) Extensive routine counseling performed  Pain, abdominal, nonspecific Patient's non-specific abdominal pains that she experienced 2-3 weeks ago ( Sx currently not active) and had a workup with another physician... we discussed today. I told her to return to my clinic if the symptoms come back. I also recommended she take  and keep a diary of her ins and outs and when those symptoms might come on in the future. Important to note when she takes NSAIDs and see if that is related as well.  Skin lesion Patient has a dermatologist that she sees regularly and she will follow up with them for further workup of this possibly changing skin lesion.    Note: This document was prepared using Dragon voice recognition software and may include unintentional dictation errors.

## 2016-02-18 NOTE — Patient Instructions (Signed)
3 mo f/up  - I/O's and sx diary, also when you advil - constipation- miralax daily - need labs - get med records - Call derm for appt of lesion on L upper outer though  Constipation, Adult Constipation is when a person has fewer than three bowel movements a week, has difficulty having a bowel movement, or has stools that are dry, hard, or larger than normal. As people grow older, constipation is more common. A low-fiber diet, not taking in enough fluids, and taking certain medicines may make constipation worse.  CAUSES   Certain medicines, such as antidepressants, pain medicine, iron supplements, antacids, and water pills.   Certain diseases, such as diabetes, irritable bowel syndrome (IBS), thyroid disease, or depression.   Not drinking enough water.   Not eating enough fiber-rich foods.   Stress or travel.   Lack of physical activity or exercise.   Ignoring the urge to have a bowel movement.   Using laxatives too much.  SIGNS AND SYMPTOMS   Having fewer than three bowel movements a week.   Straining to have a bowel movement.   Having stools that are hard, dry, or larger than normal.   Feeling full or bloated.   Pain in the lower abdomen.   Not feeling relief after having a bowel movement.  DIAGNOSIS  Your health care provider will take a medical history and perform a physical exam. Further testing may be done for severe constipation. Some tests may include:  A barium enema X-ray to examine your rectum, colon, and, sometimes, your small intestine.   A sigmoidoscopy to examine your lower colon.   A colonoscopy to examine your entire colon. TREATMENT  Treatment will depend on the severity of your constipation and what is causing it. Some dietary treatments include drinking more fluids and eating more fiber-rich foods. Lifestyle treatments may include regular exercise. If these diet and lifestyle recommendations do not help, your health care provider may  recommend taking over-the-counter laxative medicines to help you have bowel movements. Prescription medicines may be prescribed if over-the-counter medicines do not work.  HOME CARE INSTRUCTIONS   Eat foods that have a lot of fiber, such as fruits, vegetables, whole grains, and beans.  Limit foods high in fat and processed sugars, such as french fries, hamburgers, cookies, candies, and soda.   A fiber supplement may be added to your diet if you cannot get enough fiber from foods.   Drink enough fluids to keep your urine clear or pale yellow.   Exercise regularly or as directed by your health care provider.   Go to the restroom when you have the urge to go. Do not hold it.   Only take over-the-counter or prescription medicines as directed by your health care provider. Do not take other medicines for constipation without talking to your health care provider first.  [place] IF:   You have bright red blood in your stool.   Your constipation lasts for more than 4 days or gets worse.   You have abdominal or rectal pain.   You have thin, pencil-like stools.   You have unexplained weight loss. MAKE SURE YOU:   Understand these instructions.  Will watch your condition.  Will get help right away if you are not doing well or get worse.   This information is not intended to replace advice given to you by your health care provider. Make sure you discuss any questions you have with your health care provider.  Document Released: 06/03/2004 Document Revised: 09/26/2014 Document Reviewed: 06/17/2013 Elsevier Interactive Patient Education [employer].

## 2016-02-21 DIAGNOSIS — L989 Disorder of the skin and subcutaneous tissue, unspecified: Secondary | ICD-10-CM | POA: Insufficient documentation

## 2016-02-21 DIAGNOSIS — E663 Overweight: Secondary | ICD-10-CM | POA: Insufficient documentation

## 2016-02-21 DIAGNOSIS — R109 Unspecified abdominal pain: Secondary | ICD-10-CM | POA: Insufficient documentation

## 2016-02-21 NOTE — Assessment & Plan Note (Signed)
Patient's non-specific abdominal pains that she experienced 2-3 weeks ago ( Sx currently not active) and had a workup with another physician... we discussed today. I told her to return to my clinic if the symptoms come back. I also recommended she take and keep a diary of her ins and outs and when those symptoms might come on in the future. Important to note when she takes NSAIDs and see if that is related as well.

## 2016-02-21 NOTE — Assessment & Plan Note (Signed)
Controlled at current, although I told patient I prefer her blood pressure to be 130/80 or less.   Lifestyle modifications discussed with patient, DASH diet, as well as weight loss.  Continue current meds.

## 2016-02-21 NOTE — Assessment & Plan Note (Signed)
Extensive routine counseling performed

## 2016-02-21 NOTE — Assessment & Plan Note (Signed)
>>  ASSESSMENT AND PLAN FOR DIABETES ([facility]) WRITTEN ON 02/21/2016  1:39 PM BY OPALSKI, DEBORAH, DO  She will obtain regular labs with her endocrinologist that she follows up with in early June.    I asked patient to get me these medical records every 3 months so we can put them in her chart for our records.   Patient will determine in the future whether or not she wants to get her labs done here or at her endocrinologist's office.

## 2016-02-21 NOTE — Assessment & Plan Note (Addendum)
She will obtain regular labs with her endocrinologist that she follows up with in early June.    I asked patient to get me these medical records every 3 months so we can put them in her chart for our records.   Patient will determine in the future whether or not she wants to get her labs done here or at her endocrinologist's office.

## 2016-02-21 NOTE — Assessment & Plan Note (Signed)
Patient has a dermatologist that she sees regularly and she will follow up with them for further workup of this possibly changing skin lesion.

## 2016-02-21 NOTE — Assessment & Plan Note (Signed)
Continue medications. Patient will need fasting blood work in the future. She will get me these labs from her specialist.

## 2016-02-29 LAB — HEMOGLOBIN A1C: HEMOGLOBIN A1C: 7.1

## 2016-03-07 LAB — AMYLASE: Amylase: 45

## 2016-03-07 LAB — LIPASE: LIPASE: 71

## 2016-03-07 LAB — HM DIABETES FOOT EXAM
HM DIABETIC FOOT EXAM: NORMAL
HM Diabetic Foot Exam: NORMAL

## 2016-03-08 DIAGNOSIS — E119 Type 2 diabetes mellitus without complications: Secondary | ICD-10-CM

## 2016-04-08 ENCOUNTER — Encounter: Payer: Self-pay | Admitting: Family Medicine

## 2016-05-20 ENCOUNTER — Other Ambulatory Visit (INDEPENDENT_AMBULATORY_CARE_PROVIDER_SITE_OTHER): Payer: 59

## 2016-05-20 DIAGNOSIS — I10 Essential (primary) hypertension: Secondary | ICD-10-CM

## 2016-05-20 DIAGNOSIS — R1013 Epigastric pain: Secondary | ICD-10-CM

## 2016-05-20 DIAGNOSIS — E663 Overweight: Secondary | ICD-10-CM

## 2016-05-20 DIAGNOSIS — E0800 Diabetes mellitus due to underlying condition with hyperosmolarity without nonketotic hyperglycemic-hyperosmolar coma (NKHHC): Secondary | ICD-10-CM

## 2016-05-20 DIAGNOSIS — E782 Mixed hyperlipidemia: Secondary | ICD-10-CM

## 2016-05-21 LAB — VITAMIN D 25 HYDROXY (VIT D DEFICIENCY, FRACTURES): Vit D, 25-Hydroxy: 36 ng/mL (ref 30–100)

## 2016-05-21 LAB — COMPLETE METABOLIC PANEL WITH GFR
ALBUMIN: 4.8 g/dL (ref 3.6–5.1)
ALK PHOS: 54 U/L (ref 33–115)
ALT: 97 U/L — AB (ref 6–29)
AST: 51 U/L — ABNORMAL HIGH (ref 10–30)
BILIRUBIN TOTAL: 0.4 mg/dL (ref 0.2–1.2)
BUN: 14 mg/dL (ref 7–25)
CALCIUM: 10.3 mg/dL — AB (ref 8.6–10.2)
CO2: 24 mmol/L (ref 20–31)
CREATININE: 0.8 mg/dL (ref 0.50–1.10)
Chloride: 99 mmol/L (ref 98–110)
Glucose, Bld: 124 mg/dL — ABNORMAL HIGH (ref 65–99)
Potassium: 4.9 mmol/L (ref 3.5–5.3)
Sodium: 138 mmol/L (ref 135–146)
TOTAL PROTEIN: 7.3 g/dL (ref 6.1–8.1)

## 2016-05-21 LAB — CBC WITH DIFFERENTIAL/PLATELET
BASOS PCT: 0 %
Basophils Absolute: 0 cells/uL (ref 0–200)
EOS ABS: 410 {cells}/uL (ref 15–500)
EOS PCT: 5 %
HCT: 41.5 % (ref 35.0–45.0)
HEMOGLOBIN: 14 g/dL (ref 11.7–15.5)
LYMPHS ABS: 2952 {cells}/uL (ref 850–3900)
Lymphocytes Relative: 36 %
MCH: 29.1 pg (ref 27.0–33.0)
MCHC: 33.7 g/dL (ref 32.0–36.0)
MCV: 86.3 fL (ref 80.0–100.0)
MONOS PCT: 6 %
MPV: 9.2 fL (ref 7.5–12.5)
Monocytes Absolute: 492 cells/uL (ref 200–950)
NEUTROS ABS: 4346 {cells}/uL (ref 1500–7800)
Neutrophils Relative %: 53 %
PLATELETS: 338 10*3/uL (ref 140–400)
RBC: 4.81 MIL/uL (ref 3.80–5.10)
RDW: 13.4 % (ref 11.0–15.0)
WBC: 8.2 10*3/uL (ref 3.8–10.8)

## 2016-05-21 LAB — PHOSPHORUS: Phosphorus: 4.2 mg/dL (ref 2.5–4.5)

## 2016-05-21 LAB — LIPID PANEL
CHOLESTEROL: 186 mg/dL (ref 125–200)
HDL: 47 mg/dL (ref >=46)
LDL Cholesterol: 111 mg/dL (ref <130)
TRIGLYCERIDES: 138 mg/dL (ref <150)
Total CHOL/HDL Ratio: 4 Ratio (ref <=5.0)
VLDL: 28 mg/dL (ref <30)

## 2016-05-21 LAB — GAMMA GT: GGT: 35 U/L (ref 7–51)

## 2016-05-21 LAB — AMYLASE: Amylase: 30 U/L (ref 0–105)

## 2016-05-21 LAB — HEMOGLOBIN A1C
HEMOGLOBIN A1C: 6.6 % — AB (ref <5.7)
Mean Plasma Glucose: 143 mg/dL

## 2016-05-21 LAB — MAGNESIUM: Magnesium: 2.2 mg/dL (ref 1.5–2.5)

## 2016-05-21 LAB — LIPASE: LIPASE: 19 U/L (ref 7–60)

## 2016-05-21 LAB — TSH: TSH: 0.82 m[IU]/L

## 2016-05-21 LAB — BILIRUBIN,DIRECT & INDIRECT (FRACTIONATED)
Bilirubin, Direct: 0.1 mg/dL (ref <=0.2)
Indirect Bilirubin: 0.3 mg/dL (ref 0.2–1.2)

## 2016-05-21 LAB — VITAMIN B12: Vitamin B-12: 554 pg/mL (ref 200–1100)

## 2016-05-25 ENCOUNTER — Ambulatory Visit (INDEPENDENT_AMBULATORY_CARE_PROVIDER_SITE_OTHER): Payer: 59 | Admitting: Family Medicine

## 2016-05-25 ENCOUNTER — Encounter: Payer: Self-pay | Admitting: Family Medicine

## 2016-05-25 VITALS — BP 104/71 | HR 70 | Ht 66.75 in | Wt 173.6 lb

## 2016-05-25 DIAGNOSIS — R252 Cramp and spasm: Secondary | ICD-10-CM

## 2016-05-25 DIAGNOSIS — E782 Mixed hyperlipidemia: Secondary | ICD-10-CM | POA: Diagnosis not present

## 2016-05-25 DIAGNOSIS — I10 Essential (primary) hypertension: Secondary | ICD-10-CM | POA: Diagnosis not present

## 2016-05-25 DIAGNOSIS — K853 Drug induced acute pancreatitis without necrosis or infection: Secondary | ICD-10-CM

## 2016-05-25 DIAGNOSIS — E663 Overweight: Secondary | ICD-10-CM

## 2016-05-25 DIAGNOSIS — E0801 Diabetes mellitus due to underlying condition with hyperosmolarity with coma: Secondary | ICD-10-CM

## 2016-05-25 DIAGNOSIS — R748 Abnormal levels of other serum enzymes: Secondary | ICD-10-CM

## 2016-05-25 MED ORDER — GLUCOSE BLOOD VI STRP: ORAL_STRIP | 12 refills | Status: AC

## 2016-05-25 NOTE — Progress Notes (Signed)
Impression and Recommendations:    1. Diabetes mellitus due to underlying condition with hyperosmolarity and coma, without long-term current use of insulin ([place])   2. Mixed hyperlipidemia   3. Essential hypertension   4. Overweight (BMI 25.0-29.9)   5. Cramp in muscle   6. h/o Drug-induced acute pancreatitis (2nd [place] or onglyza)   7. Elevated liver enzymes   8. Chronic Mild Hypercalcemia    1. Diabetes mellitus due to underlying condition with hyperosmolarity and coma, without long-term current use of insulin-  - Apparently patient has been taking the metformin XR  Formula unknowingly and is at a current dose of 2000 mg per day.  She will call her endocrinologist to discuss this with her and see if it should be modified.   Renal function-good  - glucose blood test strip; Check 2 hour postprandial and fasting blood sugars daily; also if you feel poorly  Dispense: 100 each; Refill: 12  - Under much better control than prior. 6.6 is the latest- was 7.13 months ago and prior to that 8  - Continue dietary and lifestyle modifications in addition to meds   2. Mixed hyperlipidemia:  continue statin. LFTs lowest they've been in years per patient. Asked her to get me these records  3. Essential hypertension - Bp well-controlled.   - I'll discuss with patient at next appointment how her lisinopril as well as the hydrochlorothiazide can be contributing to pancreatitis also  4. Overweight (BMI 25.0-29.9) - Patient has lost weight, continue with healthy dietary and lifestyle modifications. Try to exercise.  5. Cramp in muscle Patient will follow with neurology at [facility]. Possible candidate for Botox injections  6. h/o Drug-induced acute pancreatitis (2nd [place] or onglyza) Past 2- 3 months it was believed patient had acute pancreatitis from her DPP 4 inhibitor medications-Januvia and Onglyza.   Her endocrinologist, Dr. Chalmers Cater, [place] those meds  7. Elevated liver enzymes Has seen  and been worked-up by GI for chronic AST/ALT elevation over the last 5 years- and they performed a right upper quadrant ultrasound and diagnosed patient with fatty liver disease.   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  Her liver enzymes have been elevated ever since that prophylactic treatment.   Yet, per patient, levels have come down significantly from previous levels "in the 100's".    8. Chronic Mild Hypercalcemia  - Per patient calcium has always been mildly high. States her endocrinologist is fine with her levels.    Patient's Medications  New Prescriptions   GLUCOSE BLOOD TEST STRIP    Check 2 hour postprandial and fasting blood sugars daily; also if you feel poorly  Previous Medications   CANAGLIFLOZIN (INVOKANA) 300 MG TABS TABLET    Take 300 mg by mouth daily before breakfast.   IBUPROFEN 200 MG CAPS    Take 600 mg by mouth as needed.    LEVONORGESTREL (MIRENA) 20 MCG/24HR IUD    Placed January, 2009    LEVOTHYROXINE (SYNTHROID, LEVOTHROID) 100 MCG TABLET    Take 1 tablet by mouth daily.   LISINOPRIL-HYDROCHLOROTHIAZIDE (PRINZIDE,ZESTORETIC) 20-12.5 MG PER TABLET    Take 1 tablet by mouth daily.   METFORMIN (GLUCOPHAGE-XR) 500 MG 24 HR TABLET    Take 2 tablets by mouth 2 (two) times daily.   SIMVASTATIN (ZOCOR) 10 MG TABLET    Take 1 tablet by mouth daily.  Modified Medications  No medications on file  Discontinued Medications   KOMBIGLYZE XR 2.01-999 MG TB24    Take 1 tablet by mouth 2 (two) times daily.   SITAGLIPTAN-METFORMIN (JANUMET) 50-500 MG PER TABLET    Take 1 tablet by mouth 2 (two) times daily with a meal.    Return in about 3 months (around 08/24/2016).  The patient was counseled, risk factors were discussed, anticipatory guidance given.  Gross side effects, risk and benefits, and alternatives of medications discussed with patient.  Patient is aware that all medications have  potential side effects and we are unable to predict every side effect or drug-drug interaction that may occur.  Expresses verbal understanding and consents to current therapy plan and treatment regimen.  Please see AVS handed out to patient at the end of our visit for further patient instructions/ counseling done pertaining to today's office visit.    Note: This document was prepared using Dragon voice recognition software and may include unintentional dictation errors.   --------------------------------------------------------------------------------------------------------------------------------------------------------------------------------------------------------------------------------------------    Subjective:    CC:  Chief Complaint  Patient presents with  . Diabetes    follow    HPI: Oliana Gowens is a 45 y.o. female who presents to [place] at [facility] today for issues as discussed below.    --->  Here to discuss her recent lab results with me in detail.  All questions were answered.   DM-  FBS- 130's up to 150-160's tops. A1c was much improved at 6.6 this last time even with Her endocrinologist Dr. Chalmers Cater d/cing dm meds thought to have been linked to acute pancreatitis (DPP-4i such as Januvia and Onglyza ).  Eating a lot better and no exercise yet.    Chronic muscular symptoms:  Evaluated and treated by Neuro doc- Dr Earle Gell- at [facility]- seeing OCt 5th- re: possible botox injections in TFL on R.  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 She had negative MRI of her head and CT scans of her spine etc.   Chronic elevated liver enzymes: Apparently in the summer 2010 patient was txed for a positive PPD skin test- took Rifampin for couple months for prophylactic TB coverage---> that caused her liver enzymes to be very elevated and they have never calmed down will been normal since. Her neurologist at [facility] requested  that she see a gastroenterologist for this.   - Apparently she did a right upper quadrant ultrasound and patient was diagnosed  with fatty liver infiltrate by the gastroenterologist.  I do not have those records and  cannot find them in care everywhere.    Recent Results (from the past 2160 hour(s))  Hemoglobin A1c     Status: None   Collection Time: 02/29/16 12:00 AM  Result Value Ref Range   Hemoglobin A1C 7.1   HM DIABETES FOOT EXAM     Status: None   Collection Time: 03/07/16 12:00 AM  Result Value Ref Range   HM Diabetic Foot Exam Normal   HM DIABETES FOOT EXAM     Status: None   Collection Time: 03/07/16 12:00 AM  Result Value Ref Range   HM Diabetic Foot Exam normal   Lipase     Status: Abnormal   Collection Time: 03/07/16 12:00 AM  Result Value Ref Range   Lipase 71   Amylase     Status: Abnormal   Collection Time: 03/07/16 12:00 AM  Result Value Ref Range   Amylase    Amylase  Status: None   Collection Time: 03/07/16 12:00 AM  Result Value Ref Range   Amylase 45   CBC with Differential/Platelet     Status: None   Collection Time: 05/20/16  8:15 AM  Result Value Ref Range   WBC 8.2 3.8 - 10.8 K/uL   RBC 4.81 3.80 - 5.10 MIL/uL   Hemoglobin 14.0 11.7 - 15.5 g/dL   HCT 41.5 35.0 - 45.0 %   MCV 86.3 80.0 - 100.0 fL   MCH 29.1 27.0 - 33.0 pg   MCHC 33.7 32.0 - 36.0 g/dL   RDW 13.4 11.0 - 15.0 %   Platelets 338 140 - 400 K/uL   MPV 9.2 7.5 - 12.5 fL   Neutro Abs 4,346 1,500 - 7,800 cells/uL   Lymphs Abs 2,952 850 - 3,900 cells/uL   Monocytes Absolute 492 200 - 950 cells/uL   Eosinophils Absolute 410 15 - 500 cells/uL   Basophils Absolute 0 0 - 200 cells/uL   Neutrophils Relative % 53 %   Lymphocytes Relative 36 %   Monocytes Relative 6 %   Eosinophils Relative 5 %   Basophils Relative 0 %   Smear Review Criteria for review not met   Hemoglobin A1c     Status: Abnormal   Collection Time: 05/20/16  8:15 AM  Result Value Ref Range   Hgb A1c MFr Bld 6.6 (H)  <5.7 %    Comment:   For someone without known diabetes, a hemoglobin A1c value of 6.5% or greater indicates that they may have diabetes and this should be confirmed with a follow-up test.   For someone with known diabetes, a value <7% indicates that their diabetes is well controlled and a value greater than or equal to 7% indicates suboptimal control. A1c targets should be individualized based on duration of diabetes, age, comorbid conditions, and other considerations.   Currently, no consensus exists for use of hemoglobin A1c for diagnosis of diabetes for children.      Mean Plasma Glucose 143 mg/dL  Lipid panel     Status: None   Collection Time: 05/20/16  8:15 AM  Result Value Ref Range   Cholesterol 186 125 - 200 mg/dL   Triglycerides 138 <150 mg/dL   HDL 47 >=46 mg/dL   Total CHOL/HDL Ratio 4.0 <=5.0 Ratio   VLDL 28 <30 mg/dL   LDL Cholesterol 111 <130 mg/dL    Comment:   Total Cholesterol/HDL Ratio:CHD Risk                        Coronary Heart Disease Risk Table                                        Men       Women          1/2 Average Risk              3.4        3.3              Average Risk              5.0        4.4           2X Average Risk              9.6  7.1           3X Average Risk             23.4       11.0 Use the calculated Patient Ratio above and the CHD Risk table  to determine the patient's CHD Risk.   Magnesium     Status: None   Collection Time: 05/20/16  8:15 AM  Result Value Ref Range   Magnesium 2.2 1.5 - 2.5 mg/dL  Phosphorus     Status: None   Collection Time: 05/20/16  8:15 AM  Result Value Ref Range   Phosphorus 4.2 2.5 - 4.5 mg/dL  TSH     Status: None   Collection Time: 05/20/16  8:15 AM  Result Value Ref Range   TSH 0.82 mIU/L    Comment:   Reference Range   > or = 20 Years  0.40-4.50   Pregnancy Range First trimester  0.26-2.66 Second trimester 0.55-2.73 Third trimester  0.43-2.91     Vitamin B12     Status:  None   Collection Time: 05/20/16  8:15 AM  Result Value Ref Range   Vitamin B-12 554 200 - 1,100 pg/mL  VITAMIN D 25 Hydroxy (Vit-D Deficiency, Fractures)     Status: None   Collection Time: 05/20/16  8:15 AM  Result Value Ref Range   Vit D, 25-Hydroxy 36 30 - 100 ng/mL    Comment: Vitamin D Status           25-OH Vitamin D        Deficiency                <20 ng/mL        Insufficiency         20 - 29 ng/mL        Optimal             > or = 30 ng/mL   For 25-OH Vitamin D testing on patients on D2-supplementation and patients for whom quantitation of D2 and D3 fractions is required, the QuestAssureD 25-OH VIT D, (D2,D3), LC/MS/MS is recommended: order code [phone] [phone] (patients > 2 yrs).   COMPLETE METABOLIC PANEL WITH GFR     Status: Abnormal   Collection Time: 05/20/16  8:15 AM  Result Value Ref Range   Sodium 138 135 - 146 mmol/L   Potassium 4.9 3.5 - 5.3 mmol/L   Chloride 99 98 - 110 mmol/L   CO2 24 20 - 31 mmol/L   Glucose, Bld 124 (H) 65 - 99 mg/dL   BUN 14 7 - 25 mg/dL   Creat 0.80 0.50 - 1.10 mg/dL   Total Bilirubin 0.4 0.2 - 1.2 mg/dL   Alkaline Phosphatase 54 33 - 115 U/L   AST 51 (H) 10 - 30 U/L   ALT 97 (H) 6 - 29 U/L   Total Protein 7.3 6.1 - 8.1 g/dL   Albumin 4.8 3.6 - 5.1 g/dL   Calcium 10.3 (H) 8.6 - 10.2 mg/dL   GFR, Est African American >89 >=60 mL/min   GFR, Est Non African American >89 >=60 mL/min  Amylase     Status: None   Collection Time: 05/20/16  8:15 AM  Result Value Ref Range   Amylase 30 0 - 105 U/L  Lipase     Status: None   Collection Time: 05/20/16  8:15 AM  Result Value Ref Range   Lipase 19 7 - 60 U/L  Gamma GT  Status: None   Collection Time: 05/20/16  8:15 AM  Result Value Ref Range   GGT 35 7 - 51 U/L  Bilirubin,Direct/Indirect(Fractionated)     Status: None   Collection Time: 05/20/16  8:15 AM  Result Value Ref Range   Bilirubin, Direct 0.1 <=0.2 mg/dL   Indirect Bilirubin 0.3 0.2 - 1.2 mg/dL    Wt Readings from Last 3  Encounters:  05/25/16 173 lb 9.6 oz (78.7 kg)  02/18/16 184 lb 3.2 oz (83.6 kg)  12/04/12 186 lb (84.4 kg)   BP Readings from Last 3 Encounters:  05/25/16 104/71  02/18/16 115/76  12/04/12 111/75   Pulse Readings from Last 3 Encounters:  05/25/16 70  02/18/16 87  12/04/12 80     Patient Active Problem List   Diagnosis Date Noted  . Mixed hyperlipidemia 10/29/2012    Priority: High  . Essential hypertension 10/29/2012    Priority: High  . Diabetes ([place])     Priority: High  . h/o Drug-induced acute pancreatitis (2nd [place] or onglyza) 05/27/2016  . Elevated liver enzymes 05/27/2016  . Chronic Mild Hypercalcemia 05/27/2016  . Overweight (BMI 25.0-29.9) 02/21/2016  . Pain, abdominal, nonspecific 02/21/2016  . Skin lesion 02/21/2016  . Enthesopathy of hip 10/19/2014  . Cramp in muscle 08/13/2013    Past Medical history, Surgical history, Family history, Social history, Allergies and Medications have been entered into the medical record, reviewed and changed as needed.   Allergies:  Allergies  Allergen Reactions  . Januvia [Sitagliptin] Other (See Comments)    Pancreatitis  . Onglyza [Saxagliptin] Other (See Comments)    Pancreatitis to all DPP 4 inhibitor medications    Review of Systems: No fever/ chills, night sweats, no unintended weight loss, No chest pain, or increased shortness of breath. No N/V/D.  Pertinent positives and negatives noted in HPI above    Objective:   Blood pressure 104/71, pulse 70, height 5' 6.75" (1.695 m), weight 173 lb 9.6 oz (78.7 kg). Body mass index is 27.39 kg/m. General: Well Developed, well nourished, appropriate for stated age.  Neuro: Alert and oriented x3, extra-ocular muscles intact, sensation grossly intact.  HEENT: Normocephalic, atraumatic, neck supple   Skin: Warm and dry, no gross rash. Cardiac: RRR, S1 S2,  no murmurs rubs or gallops.  Respiratory: ECTA B/L, Not using accessory muscles, speaking in full  sentences-unlabored. Vascular:  No gross lower ext edema, cap RF less 2 sec. Psych: No HI/SI, judgement and insight good, Euthymic mood. Full Affect.

## 2016-05-25 NOTE — Patient Instructions (Addendum)
Please call Dr Almetta Lovely office and confirm she wants to on 1000 of the metformin XR. twice daily.

## 2016-05-27 DIAGNOSIS — R748 Abnormal levels of other serum enzymes: Secondary | ICD-10-CM | POA: Insufficient documentation

## 2016-05-27 DIAGNOSIS — K853 Drug induced acute pancreatitis without necrosis or infection: Secondary | ICD-10-CM | POA: Insufficient documentation

## 2016-05-27 NOTE — Assessment & Plan Note (Addendum)
Has seen and been worked-up by GI for chronic AST/ALT elevation over the last 5 years- and they performed a right upper quadrant ultrasound and diagnosed patient with fatty liver disease.   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  Her liver enzymes have been elevated ever since that prophylactic treatment.   Yet, per patient, levels have come down significantly from previous levels "in the 100's".

## 2016-05-27 NOTE — Assessment & Plan Note (Signed)
Past 2- 3 months it was believed patient had acute pancreatitis from her DPP 4 inhibitor medications-Januvia and Onglyza.   Her endocrinologist, Dr. Chalmers Cater, [place] those meds

## 2016-07-22 ENCOUNTER — Encounter: Payer: Self-pay | Admitting: Family Medicine

## 2016-07-22 ENCOUNTER — Ambulatory Visit (INDEPENDENT_AMBULATORY_CARE_PROVIDER_SITE_OTHER): Payer: 59 | Admitting: Family Medicine

## 2016-07-22 VITALS — BP 113/76 | HR 77 | Temp 97.5°F | Ht 66.75 in | Wt 180.2 lb

## 2016-07-22 DIAGNOSIS — J111 Influenza due to unidentified influenza virus with other respiratory manifestations: Secondary | ICD-10-CM | POA: Diagnosis not present

## 2016-07-22 DIAGNOSIS — R05 Cough: Secondary | ICD-10-CM

## 2016-07-22 DIAGNOSIS — R059 Cough, unspecified: Secondary | ICD-10-CM

## 2016-07-22 DIAGNOSIS — R509 Fever, unspecified: Secondary | ICD-10-CM | POA: Diagnosis not present

## 2016-07-22 LAB — POCT INFLUENZA A/B
Influenza A, POC: POSITIVE — AB
Influenza B, POC: POSITIVE — AB

## 2016-07-22 LAB — POCT RAPID STREP A (OFFICE): RAPID STREP A SCREEN: NEGATIVE

## 2016-07-22 MED ORDER — METHYLPREDNISOLONE ACETATE 40 MG/ML IJ SUSP
40.0000 mg | Freq: Once | INTRAMUSCULAR | Status: AC
  Administered 2016-07-22: 40 mg via INTRAMUSCULAR

## 2016-07-22 NOTE — Patient Instructions (Signed)

## 2016-07-22 NOTE — Progress Notes (Signed)
Assessment and plan:  1. Influenza with respiratory manifestation other than pneumonia   2. Fever, unspecified fever cause   3. Cough     1. + FLu---> 40mg  Depo given in offfice; supportive care d/c pt. She declines cough meds or others---> will cont OTC meds.  Anticipatory guidance and routine counseling done re: condition, txmnt options and need for follow up. All questions of patient's were answered. - Supportive care and various OTC medications discussed in addition to any prescribed. - Call or RTC if new symptoms, or if no improvement or worse over next couple days.     Orders Placed This Encounter  Procedures  . POCT rapid strep A  . POCT Influenza A/B    New Prescriptions   No medications on file    Modified Medications   No medications on file    Discontinued Medications   No medications on file     Gross side effects, risk and benefits, and alternatives of medications discussed with patient.  Patient is aware that all medications have potential side effects and we are unable to predict every sideeffect or drug-drug interaction that may occur.  Expresses verbal understanding and consents to current therapy plan and treatment regiment.  Return if symptoms worsen or fail to improve.  Please see AVS handed out to patient at the end of our visit for additional patient instructions/ counseling done pertaining to today's office visit.  Note: This document was prepared using Dragon voice recognition software and may include unintentional dictation errors.    Subjective:    Chief Complaint  Patient presents with  . URI    HPI:  Pt presents with URI sx for 4 days.     CC: 99.9-102   C/o ST, stuffy nose, fevers, chills, body aches and dry cough.  SInus pressure- globally esp frontal and maxillary- better than prior. + SOB/ DOE more than usual.  Denies objective F/C, No face pain or ear pain, No N/V/D, No SOB/DIB, No Rash.    Tylenol + Mucinex Extreme  brand  Overall getting better today.   Allergies  Allergen Reactions  . Januvia [Sitagliptin] Other (See Comments)    Pancreatitis  . Onglyza [Saxagliptin] Other (See Comments)    Pancreatitis to all DPP 4 inhibitor medications  . Lidocaine     Other reaction(s): Other (See Comments) Blisters    Patient Active Problem List   Diagnosis Date Noted  . Mixed hyperlipidemia 10/29/2012    Priority: High  . Essential hypertension 10/29/2012    Priority: High  . Diabetes ([place])     Priority: High  . h/o Drug-induced acute pancreatitis (2nd [place] or onglyza) 05/27/2016  . Elevated liver enzymes 05/27/2016  . Chronic Mild Hypercalcemia 05/27/2016  . Overweight (BMI 25.0-29.9) 02/21/2016  . Pain, abdominal, nonspecific 02/21/2016  . Skin lesion 02/21/2016  . Enthesopathy of hip 10/19/2014  . Cramp in muscle 08/13/2013    Past medical history, Surgical history, Family history reviewed and noted below, Social history, Allergies, and Medications have been entered into the medical record, reviewed and changed as needed.   Allergies  Allergen Reactions  . Januvia [Sitagliptin] Other (See Comments)    Pancreatitis  . Onglyza [Saxagliptin] Other (See Comments)    Pancreatitis to all DPP 4 inhibitor medications  . Lidocaine     Other reaction(s): Other (See Comments) Blisters      Objective:   Blood pressure 113/76, pulse 77, temperature 97.5 F (36.4 C), temperature source Oral, height 5' 6.75" (  1.695 m), weight 180 lb 3.2 oz (81.7 kg). Body mass index is 28.44 kg/m. General: Well Developed, well nourished, appropriate for stated age.  Neuro: Alert and oriented x3, extra-ocular muscles intact, sensation grossly intact.  HEENT: Normocephalic, atraumatic, pupils equal round reactive to light, neck supple, no masses, no painful lymphadenopathy, TM's intact B/L, no acute findings. Nares- patent, clear d/c, OP- clear, globally TTP sinuses Skin: Warm and dry, no gross rash. Cardiac:  RRR, S1 S2,  no murmurs rubs or gallops.  Respiratory: ECTA B/L and A/P, no wh; Not using accessory muscles, speaking in full sentences Vascular:  No gross lower ext edema, cap RF less 2 sec. Psych: No HI/SI, judgement and insight good, Euthymic mood. Full Affect.   Patient Care Team    Relationship Specialty Notifications Start End  Mellody Dance, DO PCP - General Family Medicine  02/18/16   Amy Martinique, MD Consulting Physician Dermatology  05/25/16    Comment: Wyatt Portela

## 2016-07-27 ENCOUNTER — Other Ambulatory Visit: Payer: Self-pay | Admitting: Family Medicine

## 2016-08-24 ENCOUNTER — Other Ambulatory Visit (INDEPENDENT_AMBULATORY_CARE_PROVIDER_SITE_OTHER): Payer: 59

## 2016-08-24 DIAGNOSIS — E0800 Diabetes mellitus due to underlying condition with hyperosmolarity without nonketotic hyperglycemic-hyperosmolar coma (NKHHC): Secondary | ICD-10-CM | POA: Diagnosis not present

## 2016-08-24 DIAGNOSIS — I10 Essential (primary) hypertension: Secondary | ICD-10-CM

## 2016-08-24 DIAGNOSIS — R748 Abnormal levels of other serum enzymes: Secondary | ICD-10-CM

## 2016-08-24 LAB — POCT GLYCOSYLATED HEMOGLOBIN (HGB A1C): Hemoglobin A1C: 7

## 2016-08-25 LAB — CBC WITH DIFFERENTIAL/PLATELET
BASOS ABS: 0 {cells}/uL (ref 0–200)
Basophils Relative: 0 %
EOS ABS: 160 {cells}/uL (ref 15–500)
EOS PCT: 2 %
HCT: 41.1 % (ref 35.0–45.0)
HEMOGLOBIN: 13.4 g/dL (ref 11.7–15.5)
LYMPHS ABS: 2640 {cells}/uL (ref 850–3900)
Lymphocytes Relative: 33 %
MCH: 28.8 pg (ref 27.0–33.0)
MCHC: 32.6 g/dL (ref 32.0–36.0)
MCV: 88.4 fL (ref 80.0–100.0)
MONO ABS: 640 {cells}/uL (ref 200–950)
MPV: 9.2 fL (ref 7.5–12.5)
Monocytes Relative: 8 %
NEUTROS ABS: 4560 {cells}/uL (ref 1500–7800)
Neutrophils Relative %: 57 %
Platelets: 329 10*3/uL (ref 140–400)
RBC: 4.65 MIL/uL (ref 3.80–5.10)
RDW: 13.5 % (ref 11.0–15.0)
WBC: 8 10*3/uL (ref 3.8–10.8)

## 2016-08-25 LAB — COMPREHENSIVE METABOLIC PANEL
ALBUMIN: 4.7 g/dL (ref 3.6–5.1)
ALT: 105 U/L — ABNORMAL HIGH (ref 6–29)
AST: 53 U/L — ABNORMAL HIGH (ref 10–35)
Alkaline Phosphatase: 48 U/L (ref 33–115)
BILIRUBIN TOTAL: 0.5 mg/dL (ref 0.2–1.2)
BUN: 14 mg/dL (ref 7–25)
CHLORIDE: 98 mmol/L (ref 98–110)
CO2: 24 mmol/L (ref 20–31)
CREATININE: 0.82 mg/dL (ref 0.50–1.10)
Calcium: 9.8 mg/dL (ref 8.6–10.2)
Glucose, Bld: 134 mg/dL — ABNORMAL HIGH (ref 65–99)
Potassium: 4.4 mmol/L (ref 3.5–5.3)
SODIUM: 136 mmol/L (ref 135–146)
TOTAL PROTEIN: 7.1 g/dL (ref 6.1–8.1)

## 2016-08-25 LAB — T4, FREE: FREE T4: 1.5 ng/dL (ref 0.8–1.8)

## 2016-08-25 LAB — TSH: TSH: 0.69 m[IU]/L

## 2016-08-31 ENCOUNTER — Encounter: Payer: Self-pay | Admitting: Family Medicine

## 2016-08-31 ENCOUNTER — Ambulatory Visit (INDEPENDENT_AMBULATORY_CARE_PROVIDER_SITE_OTHER): Payer: 59 | Admitting: Family Medicine

## 2016-08-31 VITALS — BP 122/77 | HR 86 | Ht 66.75 in | Wt 181.0 lb

## 2016-08-31 DIAGNOSIS — E1136 Type 2 diabetes mellitus with diabetic cataract: Secondary | ICD-10-CM

## 2016-08-31 DIAGNOSIS — Z789 Other specified health status: Secondary | ICD-10-CM | POA: Diagnosis not present

## 2016-08-31 DIAGNOSIS — Z23 Encounter for immunization: Secondary | ICD-10-CM | POA: Diagnosis not present

## 2016-08-31 DIAGNOSIS — E0800 Diabetes mellitus due to underlying condition with hyperosmolarity without nonketotic hyperglycemic-hyperosmolar coma (NKHHC): Secondary | ICD-10-CM | POA: Diagnosis not present

## 2016-08-31 DIAGNOSIS — E782 Mixed hyperlipidemia: Secondary | ICD-10-CM

## 2016-08-31 DIAGNOSIS — E663 Overweight: Secondary | ICD-10-CM

## 2016-08-31 DIAGNOSIS — R748 Abnormal levels of other serum enzymes: Secondary | ICD-10-CM

## 2016-08-31 DIAGNOSIS — I10 Essential (primary) hypertension: Secondary | ICD-10-CM

## 2016-08-31 LAB — POCT UA - MICROALBUMIN
Albumin/Creatinine Ratio, Urine, POC: <30
CREATININE, POC: 200 mg/dL
MICROALBUMIN (UR) POC: 10 mg/L

## 2016-08-31 NOTE — Progress Notes (Signed)
Assessment and plan:  1. Diabetes mellitus due to underlying condition with hyperosmolarity without coma, without long-term current use of insulin ([place])   2. Allergy history unknown   3. Need for prophylactic vaccination against Streptococcus pneumoniae (pneumococcus)   4. Diabetic cataract ([place])   5. Essential hypertension   6. Mixed hyperlipidemia   7. Elevated liver enzymes   8. Overweight (BMI 25.0-29.9)     No problem-specific Assessment & Plan notes found for this encounter.  DM - farily good control. Discussed improving diet choices. Will recheck in 3 momht with endocrine and adjust medications if control  Continues to worsen.  Bp is well controlled. No change in medications Has f/u with ophthamology Lipids - LDL is above goal on statin. Work on diet and recheck at future visit Updated vaccines. Liver funtion stable Discussed diet and exercise.  Chronic gerd/ibs -meds ordered. No red flags identified. F/u with gi   New Prescriptions   ONDANSETRON (ZOFRAN ODT) 4 MG DISINTEGRATING TABLET    Take 1 tablet (4 mg total) by mouth every 8 (eight) hours as needed for nausea or vomiting.   PANTOPRAZOLE (PROTONIX) 40 MG TABLET    Take 1 tablet (40 mg total) by mouth daily.   SUCRALFATE (CARAFATE) 1 G TABLET    Take 1 tablet (1 g total) by mouth 4 (four) times daily.    Modified Medications   No medications on file    Discontinued Medications   No medications on file     Return in about 3 months (around 11/29/2016).  Anticipatory guidance and routine counseling done re: condition, txmnt options and need for follow up. All questions of patient's were answered.   Gross side effects, risk and benefits, and alternatives of medications discussed with patient.  Patient is aware that all medications have potential side effects and we are unable to predict every sideeffect or drug-drug interaction that may occur.   Expresses verbal understanding and consents to current therapy plan and treatment regiment.  Please see AVS handed out to patient at the end of our visit for additional patient instructions/ counseling done pertaining to today's office visit.  Note: This document was prepared using Dragon voice recognition software and may include unintentional dictation errors.   ----------------------------------------------------------------------------------------------------------------------  Subjective:   CC:   Jessica Chang is a 45 y.o. female who presents to [place] at [facility] today for review and discussion of recent bloodwork that was done.  1. All recent blood work that we ordered was reviewed with patient today.  Patient was counseled on all abnormalities and we discussed dietary and lifestyle changes that could help those values (also medications when appropriate).  Extensive health counseling performed and all patient's concerns/ questions were addressed.   Lab Results  Component Value Date   HGBA1C 7.0 08/24/2016   HGBA1C 6.6 (H) 05/20/2016   HGBA1C 7.1 02/29/2016    Wt Readings from Last 3 Encounters:  09/13/16 175 lb (79.4 kg)  08/31/16 181 lb (82.1 kg)  07/22/16 180 lb 3.2 oz (81.7 kg)   BP Readings from Last 3 Encounters:  09/13/16 113/78  08/31/16 122/77  07/22/16 113/76   Pulse Readings from Last 3 Encounters:  09/13/16 97  08/31/16 86  07/22/16 77   BMI Readings from Last 3 Encounters:  09/13/16 27.41 kg/m  08/31/16 28.56 kg/m  07/22/16 28.44 kg/m    Lab Results  Component Value Date   CHOL 186 05/20/2016   CHOL  146 08/26/2013   Lab Results  Component Value Date   HDL 47 05/20/2016   HDL 37 08/26/2013   Lab Results  Component Value Date   LDLCALC 111 05/20/2016   LDLCALC 62 08/26/2013   Lab Results  Component Value Date   TRIG 138 05/20/2016   TRIG 235 (A) 08/26/2013   Lab Results  Component Value Date   CHOLHDL 4.0  05/20/2016   No results found for: LDLDIRECT  Patient Care Team    Relationship Specialty Notifications Start End  Mellody Dance, DO PCP - General Family Medicine  02/18/16   Amy Martinique, MD Consulting Physician Dermatology  05/25/16    Comment: Dalphine Handing, MD Referring Physician Psychiatry  08/31/16    Comment: neuropsychiatry  Chari Manning, MD Attending Physician Orthopedic Surgery  09/22/16   Levin Erp, [place] Physician Assistant Gastroenterology  09/22/16   Jacelyn Pi, MD Consulting Physician Endocrinology  09/22/16   Marylynn Pearson, MD Consulting Physician Obstetrics and Gynecology  09/22/16   Macarthur Critchley, [place] Referring Physician Optometry  09/22/16     Full medical history updated and reviewed in the office today  Patient Active Problem List   Diagnosis Date Noted  . Mixed hyperlipidemia 10/29/2012    Priority: High  . Essential hypertension 10/29/2012    Priority: High  . Diabetes ([place])     Priority: High  . Diabetic cataract ([employer]) 08/31/2016  . h/o Drug-induced acute pancreatitis (2nd [place] or onglyza) 05/27/2016  . Elevated liver enzymes 05/27/2016  . Chronic Mild Hypercalcemia 05/27/2016  . Overweight (BMI 25.0-29.9) 02/21/2016  . Pain, abdominal, nonspecific 02/21/2016  . Skin lesion 02/21/2016  . Enthesopathy of hip 10/19/2014  . Cramp in muscle 08/13/2013    Past Medical History:  Diagnosis Date  . Acute pancreatitis   . Cancer (HCC)    squamous cell skin cancer  . Diabetes ([place])   . Hyperlipidemia   . Hypertension   . Thyroid disease     Past Surgical History:  Procedure Laterality Date  . CESAREAN SECTION    . CHOLECYSTECTOMY    . HIP ARTHROSCOPY N/A   . KNEE ARTHROCENTESIS    . TONSILLECTOMY      Social History  Substance Use Topics  . Smoking status: Former Smoker    Quit date: 09/19/2005  . Smokeless tobacco: Never Used     Comment: Quit 2007  . Alcohol use Yes     Comment: Drinks alcohol four times a month.     Family Hx: Family History  Problem Relation Age of Onset  . Cancer Mother     melanoma  . Heart disease Mother   . Diabetes Father   . Hyperlipidemia Father   . Hypertension Father   . Alcohol abuse Sister   . Mental illness Sister   . Cancer Paternal Aunt     melanoma  . Heart attack Paternal Aunt   . Cancer Maternal Grandmother     breast  . AAA (abdominal aortic aneurysm) Maternal Grandfather   . Diabetes Paternal Grandmother   . Cancer Paternal Grandfather     melanoma  . Stroke Paternal Grandfather      Medications: Current Outpatient Prescriptions  Medication Sig Dispense Refill  . canagliflozin (INVOKANA) 300 MG TABS tablet Take 300 mg by mouth daily before breakfast.    . glucose blood test strip Check 2 hour postprandial and fasting blood sugars daily; also if you feel poorly 100 each 12  .  Ibuprofen 200 MG CAPS Take 600 mg by mouth as needed.     Marland Kitchen levonorgestrel (MIRENA) 20 MCG/24HR IUD Placed January, 2009     . levothyroxine (SYNTHROID, LEVOTHROID) 100 MCG tablet Take 1 tablet by mouth daily.    Marland Kitchen lisinopril-hydrochlorothiazide (PRINZIDE,ZESTORETIC) 20-12.5 MG tablet TAKE 1 TABLET BY MOUTH EVERY DAY 90 tablet 1  . metFORMIN (GLUCOPHAGE-XR) 500 MG 24 hr tablet Take 2 tablets by mouth 2 (two) times daily.    . simvastatin (ZOCOR) 10 MG tablet Take 1 tablet by mouth daily.    . ondansetron (ZOFRAN ODT) 4 MG disintegrating tablet Take 1 tablet (4 mg total) by mouth every 8 (eight) hours as needed for nausea or vomiting. 20 tablet 0  . pantoprazole (PROTONIX) 40 MG tablet Take 1 tablet (40 mg total) by mouth daily. 30 tablet 1  . sucralfate (CARAFATE) 1 g tablet Take 1 tablet (1 g total) by mouth 4 (four) times daily. 120 tablet 1   No current facility-administered medications for this visit.     Allergies:  Allergies  Allergen Reactions  . Januvia [Sitagliptin] Other (See Comments)    Pancreatitis  . Onglyza [Saxagliptin] Other (See Comments)     Pancreatitis to all DPP 4 inhibitor medications  . Cherry Swelling  . Lidocaine     Other reaction(s): Other (See Comments) Blisters     ROS: ROS    Objective:  Blood pressure 122/77, pulse 86, height 5' 6.75" (1.695 m), weight 181 lb (82.1 kg). Body mass index is 28.56 kg/m. Gen:   Well NAD, A and O *3 HEENT:    [place]/AT, EOMI,  MMM, OP- clr Lungs:   Normal work of breathing. CTA B/L, no Wh, rhonchi Heart:   RRR, S1, S2 WNL's, no MRG Abd:   No gross distention Exts:    warm, pink,  Brisk capillary refill, warm and well perfused.  Psych:    No HI/SI, judgement and insight good, Euthymic mood. Full Affect.   Recent Results (from the past 2160 hour(s))  POCT rapid strep A     Status: Normal   Collection Time: 07/22/16 10:27 AM  Result Value Ref Range   Rapid Strep A Screen Negative Negative  POCT Influenza A/B     Status: Abnormal   Collection Time: 07/22/16 11:10 AM  Result Value Ref Range   Influenza A, POC Positive (A) Negative   Influenza B, POC Positive (A) Negative  CBC with Differential/Platelet     Status: None   Collection Time: 08/24/16  9:35 AM  Result Value Ref Range   WBC 8.0 3.8 - 10.8 K/uL   RBC 4.65 3.80 - 5.10 MIL/uL   Hemoglobin 13.4 11.7 - 15.5 g/dL   HCT 41.1 35.0 - 45.0 %   MCV 88.4 80.0 - 100.0 fL   MCH 28.8 27.0 - 33.0 pg   MCHC 32.6 32.0 - 36.0 g/dL   RDW 13.5 11.0 - 15.0 %   Platelets 329 140 - 400 K/uL   MPV 9.2 7.5 - 12.5 fL   Neutro Abs 4,560 1,500 - 7,800 cells/uL   Lymphs Abs 2,640 850 - 3,900 cells/uL   Monocytes Absolute 640 200 - 950 cells/uL   Eosinophils Absolute 160 15 - 500 cells/uL   Basophils Absolute 0 0 - 200 cells/uL   Neutrophils Relative % 57 %   Lymphocytes Relative 33 %   Monocytes Relative 8 %   Eosinophils Relative 2 %   Basophils Relative 0 %   Smear Review Criteria  for review not met   Comprehensive metabolic panel     Status: Abnormal   Collection Time: 08/24/16  9:35 AM  Result Value Ref Range   Sodium 136  135 - 146 mmol/L   Potassium 4.4 3.5 - 5.3 mmol/L   Chloride 98 98 - 110 mmol/L   CO2 24 20 - 31 mmol/L   Glucose, Bld 134 (H) 65 - 99 mg/dL   BUN 14 7 - 25 mg/dL   Creat 0.82 0.50 - 1.10 mg/dL   Total Bilirubin 0.5 0.2 - 1.2 mg/dL   Alkaline Phosphatase 48 33 - 115 U/L   AST 53 (H) 10 - 35 U/L   ALT 105 (H) 6 - 29 U/L   Total Protein 7.1 6.1 - 8.1 g/dL   Albumin 4.7 3.6 - 5.1 g/dL   Calcium 9.8 8.6 - 10.2 mg/dL  T4, free     Status: None   Collection Time: 08/24/16  9:35 AM  Result Value Ref Range   Free T4 1.5 0.8 - 1.8 ng/dL  TSH     Status: None   Collection Time: 08/24/16  9:35 AM  Result Value Ref Range   TSH 0.69 mIU/L    Comment:   Reference Range   > or = 20 Years  0.40-4.50   Pregnancy Range First trimester  0.26-2.66 Second trimester 0.55-2.73 Third trimester  0.43-2.91     POCT glycosylated hemoglobin (Hb A1C)     Status: Abnormal   Collection Time: 08/24/16  9:47 AM  Result Value Ref Range   Hemoglobin A1C 7.0   POCT UA - Microalbumin     Status: Normal   Collection Time: 08/31/16  2:04 PM  Result Value Ref Range   Microalbumin Ur, POC 10 mg/L   Creatinine, POC 200 mg/dL   Albumin/Creatinine Ratio, Urine, POC <30   Lipase, blood     Status: None   Collection Time: 09/13/16  5:40 PM  Result Value Ref Range   Lipase 20 11 - 51 U/L  Comprehensive metabolic panel     Status: Abnormal   Collection Time: 09/13/16  5:40 PM  Result Value Ref Range   Sodium 129 (L) 135 - 145 mmol/L   Potassium 3.4 (L) 3.5 - 5.1 mmol/L   Chloride 97 (L) 101 - 111 mmol/L   CO2 22 22 - 32 mmol/L   Glucose, Bld 144 (H) 65 - 99 mg/dL   BUN 16 6 - 20 mg/dL   Creatinine, Ser 0.75 0.44 - 1.00 mg/dL   Calcium 8.6 (L) 8.9 - 10.3 mg/dL   Total Protein 7.5 6.5 - 8.1 g/dL   Albumin 4.6 3.5 - 5.0 g/dL   AST 31 15 - 41 U/L   ALT 76 (H) 14 - 54 U/L   Alkaline Phosphatase 56 38 - 126 U/L   Total Bilirubin 0.7 0.3 - 1.2 mg/dL   GFR calc non Af Amer >60 >60 mL/min   GFR calc Af Amer  >60 >60 mL/min    Comment: (NOTE) The eGFR has been calculated using the CKD EPI equation. This calculation has not been validated in all clinical situations. eGFR's persistently <60 mL/min signify possible Chronic Kidney Disease.    Anion gap 10 5 - 15  CBC     Status: None   Collection Time: 09/13/16  5:40 PM  Result Value Ref Range   WBC 10.8 3.6 - 11.0 K/uL   RBC 4.91 3.80 - 5.20 MIL/uL   Hemoglobin 14.6 12.0 - 16.0 g/dL  HCT 42.3 35.0 - 47.0 %   MCV 86.1 80.0 - 100.0 fL   MCH 29.6 26.0 - 34.0 pg   MCHC 34.4 32.0 - 36.0 g/dL   RDW 12.9 11.5 - 14.5 %   Platelets 302 150 - 440 K/uL  Troponin I     Status: None   Collection Time: 09/13/16  5:40 PM  Result Value Ref Range   Troponin I <0.03 <0.03 ng/mL  Urinalysis, Complete w Microscopic     Status: Abnormal   Collection Time: 09/13/16  5:41 PM  Result Value Ref Range   Color, Urine STRAW (A) YELLOW   APPearance CLEAR (A) CLEAR   Specific Gravity, Urine 1.009 1.005 - 1.030   pH 5.0 5.0 - 8.0   Glucose, UA >=500 (A) NEGATIVE mg/dL   Hgb urine dipstick NEGATIVE NEGATIVE   Bilirubin Urine NEGATIVE NEGATIVE   Ketones, ur 20 (A) NEGATIVE mg/dL   Protein, ur NEGATIVE NEGATIVE mg/dL   Nitrite NEGATIVE NEGATIVE   Leukocytes, UA NEGATIVE NEGATIVE   RBC / HPF 0-5 0 - 5 RBC/hpf   WBC, UA 0-5 0 - 5 WBC/hpf   Bacteria, UA NONE SEEN NONE SEEN   Squamous Epithelial / LPF NONE SEEN NONE SEEN  Pregnancy, urine POC     Status: None   Collection Time: 09/13/16  6:33 PM  Result Value Ref Range   Preg Test, Ur NEGATIVE NEGATIVE    Comment:        THE SENSITIVITY OF THIS METHODOLOGY IS >24 mIU/mL

## 2016-08-31 NOTE — Patient Instructions (Addendum)
FODMAP diet in new year.  Strict elimination diet.   - go to your appointment for her allergist with Dr. Sunny Schlein. Please let me know how that goes.    Gluten-Free Diet for Celiac Disease, Adult The gluten-free diet includes all foods that do not contain gluten. Gluten is a protein that is found in wheat, rye, barley, and some other grains. Following the gluten-free diet is the only treatment for people with celiac disease. It helps to prevent damage to the intestines and improves or eliminates the symptoms of celiac disease. Following the gluten-free diet requires some planning. It can be challenging at first, but it gets easier with time and practice. There are more gluten-free options available today than ever before. If you need help finding gluten-free foods or if you have questions, talk with your diet and nutrition specialist (registered dietitian) or your health care provider. What do I need to know about a gluten-free diet?  All fruits, vegetables, and meats are safe to eat and do not contain gluten.  When grocery shopping, start by shopping in the produce, meat, and dairy sections. These sections are more likely to contain gluten-free foods. Then move to the aisles that contain packaged foods if you need to.  Read all food labels. Gluten is often added to foods. Always check the ingredient list and look for warnings, such as "may contain gluten."  Talk with your dietitian or health care provider before taking a gluten-free multivitamin or mineral supplement.  Be aware of gluten-free foods having contact with foods that contain gluten (cross-contamination). This can happen at home and with any processed foods.  Talk with your health care provider or dietitian about how to reduce the risk of cross-contamination in your home.  If you have questions about how a food is processed, ask the manufacturer. What key words help to identify gluten? Foods that list any of these key words on the  label usually contain gluten:  Wheat, flour, enriched flour, bromated flour, white flour, durum flour, graham flour, phosphated flour, self-rising flour, semolina, farina, barley (malt), rye, and oats.  Starch, dextrin, modified food starch, or cereal.  Thickening, fillers, or emulsifiers.  Malt flavoring, malt extract, or malt syrup.  Hydrolyzed vegetable protein. In the [place] packaged foods that are gluten-free are required to be labeled "GF." These foods should be easy to identify and are safe to eat. In the [place] food companies are also required to list common food allergens, including wheat, on their labels. Recommended foods Grains  Amaranth, bean flours, 100% buckwheat flour, corn, millet, nut flours or nut meals, GF oats, quinoa, rice, sorghum, teff, rice wafers, pure cornmeal tortillas, popcorn, and hot cereals made from cornmeal. Hominy, rice, wild rice. Some Asian rice noodles or bean noodles. Arrowroot starch, corn bran, corn flour, corn germ, cornmeal, corn starch, potato flour, potato starch flour, and rice bran. Plain, brown, and sweet rice flours. Rice polish, soy flour, and tapioca starch. Vegetables  All plain fresh, frozen, and canned vegetables. Fruits  All plain fresh, frozen, canned, and dried fruits, and 100% fruit juices. Meats and other protein foods  All fresh beef, pork, poultry, fish, seafood, and eggs. Fish canned in water, oil, brine, or vegetable broth. Plain nuts and seeds, peanut butter. Some lunch meat and some frankfurters. Dried beans, dried peas, and lentils. Dairy  Fresh plain, dry, evaporated, or condensed milk. Cream, butter, sour cream, whipping cream, and most yogurts. Unprocessed cheese, most processed cheeses, some cottage cheese, some cream cheeses. Beverages  Coffee, tea, most herbal teas. Carbonated beverages and some root beers. Wine, sake, and distilled spirits, such as gin, vodka, and whiskey. Most hard ciders. Fats and oils  Butter,  margarine, vegetable oil, hydrogenated butter, olive oil, shortening, lard, cream, and some mayonnaise. Some commercial salad dressings. Olives. Sweets and desserts  Sugar, honey, some syrups, molasses, jelly, and jam. Plain hard candy, marshmallows, and gumdrops. Pure cocoa powder. Plain chocolate. Custard and some pudding mixes. Gelatin desserts, sorbets, frozen ice pops, and sherbet. Cake, cookies, and other desserts prepared with allowed flours. Some commercial ice creams. Cornstarch, tapioca, and rice puddings. Seasoning and other foods  Some canned or frozen soups. Monosodium glutamate (MSG). Cider, rice, and wine vinegar. Baking soda and baking powder. Cream of tartar. Baking and nutritional yeast. Certain soy sauces made without wheat (ask your dietitian about specific brands that are allowed). Nuts, coconut, and chocolate. Salt, pepper, herbs, spices, flavoring extracts, imitation or artificial flavorings, natural flavorings, and food colorings. Some medicines and supplements. Some lip glosses and other cosmetics. Rice syrups. The items listed may not be a complete list. Talk with your dietitian about what dietary choices are best for you. Foods to avoid Grains  Barley, bran, bulgur, couscous, cracked wheat, [place], farro, graham, malt, matzo, semolina, wheat germ, and all wheat and rye cereals including spelt and kamut. Cereals containing malt as a flavoring, such as rice cereal. Noodles, spaghetti, macaroni, most packaged rice mixes, and all mixes containing wheat, rye, barley, or triticale. Vegetables  Most creamed vegetables and most vegetables canned in sauces. Some commercially prepared vegetables and salads. Fruits  Thickened or prepared fruits and some pie fillings. Some fruit snacks and fruit roll-ups. Meats and other protein foods  Any meat or meat alternative containing wheat, rye, barley, or gluten stabilizers. These are often marinated or packaged meats and lunch meats.  Bread-containing products, such as Swiss steak, croquettes, meatballs, and meatloaf. Most tuna canned in vegetable broth and [place] with hydrolyzed vegetable protein (HVP) injected as part of the basting. [place]. Imitation fish. Eggs in sauces made from ingredients to avoid. Dairy  Commercial chocolate milk drinks and malted milk. Some non-dairy creamers. Any cheese product containing ingredients to avoid. Beverages  Certain cereal beverages. Beer, ale, malted milk, and some root beers. Some hard ciders. Some instant flavored coffees. Some herbal teas made with barley or with barley malt added. Fats and oils  Some commercial salad dressings. Sour cream containing modified food starch. Sweets and desserts  Some toffees. Chocolate-coated nuts (may be rolled in wheat flour) and some commercial candies and candy bars. Most cakes, cookies, donuts, pastries, and other baked goods. Some commercial ice cream. Ice cream cones. Commercially prepared mixes for cakes, cookies, and other desserts. Bread pudding and other puddings thickened with flour. Products containing brown rice syrup made with barley malt enzyme. Desserts and sweets made with malt flavoring. Seasoning and other foods  Some curry powders, some dry seasoning mixes, some gravy extracts, some meat sauces, some ketchups, some prepared mustards, and horseradish. Certain soy sauces. Malt vinegar. Bouillon and bouillon cubes that contain HVP. Some chip dips, and some chewing gum. Yeast extract. Brewer's yeast. Caramel color. Some medicines and supplements. Some lip glosses and other cosmetics. The items listed may not be a complete list. Talk with your dietitian about what dietary choices are best for you. Summary  Gluten is a protein that is found in wheat, rye, barley, and some other grains. The gluten-free diet includes all foods that do not contain  gluten.  If you need help finding gluten-free foods or if you have questions, talk with your  diet and nutrition specialist (registered dietitian) or your health care provider.  Read all food labels. Gluten is often added to foods. Always check the ingredient list and look for warnings, such as "may contain gluten." This information is not intended to replace advice given to you by your health care provider. Make sure you discuss any questions you have with your health care provider. Document Released: 09/05/2005 Document Revised: 06/20/2016 Document Reviewed: 06/20/2016 Elsevier Interactive Patient Education  2017 [employer].

## 2016-09-13 ENCOUNTER — Emergency Department
Admission: EM | Admit: 2016-09-13 | Discharge: 2016-09-13 | Disposition: A | Discharge status: Home / Self Care | Payer: 59 | Attending: Emergency Medicine | Admitting: Emergency Medicine

## 2016-09-13 ENCOUNTER — Emergency Department: Payer: 59

## 2016-09-13 ENCOUNTER — Telehealth: Payer: Self-pay

## 2016-09-13 ENCOUNTER — Encounter: Payer: Self-pay | Admitting: Emergency Medicine

## 2016-09-13 DIAGNOSIS — Z85828 Personal history of other malignant neoplasm of skin: Secondary | ICD-10-CM | POA: Diagnosis not present

## 2016-09-13 DIAGNOSIS — I10 Essential (primary) hypertension: Secondary | ICD-10-CM | POA: Diagnosis not present

## 2016-09-13 DIAGNOSIS — K219 Gastro-esophageal reflux disease without esophagitis: Secondary | ICD-10-CM

## 2016-09-13 DIAGNOSIS — Z79899 Other long term (current) drug therapy: Secondary | ICD-10-CM | POA: Diagnosis not present

## 2016-09-13 DIAGNOSIS — R1013 Epigastric pain: Secondary | ICD-10-CM

## 2016-09-13 DIAGNOSIS — Z87891 Personal history of nicotine dependence: Secondary | ICD-10-CM | POA: Insufficient documentation

## 2016-09-13 DIAGNOSIS — E119 Type 2 diabetes mellitus without complications: Secondary | ICD-10-CM | POA: Diagnosis not present

## 2016-09-13 DIAGNOSIS — Z7984 Long term (current) use of oral hypoglycemic drugs: Secondary | ICD-10-CM | POA: Diagnosis not present

## 2016-09-13 LAB — COMPREHENSIVE METABOLIC PANEL
ALBUMIN: 4.6 g/dL (ref 3.5–5.0)
ALK PHOS: 56 U/L (ref 38–126)
ALT: 76 U/L — ABNORMAL HIGH (ref 14–54)
AST: 31 U/L (ref 15–41)
Anion gap: 10 (ref 5–15)
BUN: 16 mg/dL (ref 6–20)
CO2: 22 mmol/L (ref 22–32)
Calcium: 8.6 mg/dL — ABNORMAL LOW (ref 8.9–10.3)
Chloride: 97 mmol/L — ABNORMAL LOW (ref 101–111)
Creatinine, Ser: 0.75 mg/dL (ref 0.44–1.00)
GFR calc Af Amer: >60 mL/min (ref >60)
GFR calc non Af Amer: >60 mL/min (ref >60)
GLUCOSE: 144 mg/dL — AB (ref 65–99)
POTASSIUM: 3.4 mmol/L — AB (ref 3.5–5.1)
Sodium: 129 mmol/L — ABNORMAL LOW (ref 135–145)
Total Bilirubin: 0.7 mg/dL (ref 0.3–1.2)
Total Protein: 7.5 g/dL (ref 6.5–8.1)

## 2016-09-13 LAB — CBC
HEMATOCRIT: 42.3 % (ref 35.0–47.0)
Hemoglobin: 14.6 g/dL (ref 12.0–16.0)
MCH: 29.6 pg (ref 26.0–34.0)
MCHC: 34.4 g/dL (ref 32.0–36.0)
MCV: 86.1 fL (ref 80.0–100.0)
Platelets: 302 10*3/uL (ref 150–440)
RBC: 4.91 MIL/uL (ref 3.80–5.20)
RDW: 12.9 % (ref 11.5–14.5)
WBC: 10.8 10*3/uL (ref 3.6–11.0)

## 2016-09-13 LAB — LIPASE, BLOOD: Lipase: 20 U/L (ref 11–51)

## 2016-09-13 LAB — TROPONIN I: Troponin I: <0.03 ng/mL (ref <0.03)

## 2016-09-13 LAB — POCT PREGNANCY, URINE: Preg Test, Ur: NEGATIVE

## 2016-09-13 LAB — URINALYSIS, COMPLETE (UACMP) WITH MICROSCOPIC
BACTERIA UA: NONE SEEN
BILIRUBIN URINE: NEGATIVE
Glucose, UA: >=500 mg/dL — AB
Hgb urine dipstick: NEGATIVE
Ketones, ur: 20 mg/dL — AB
Leukocytes, UA: NEGATIVE
NITRITE: NEGATIVE
PH: 5 (ref 5.0–8.0)
Protein, ur: NEGATIVE mg/dL
SPECIFIC GRAVITY, URINE: 1.009 (ref 1.005–1.030)
SQUAMOUS EPITHELIAL / LPF: NONE SEEN

## 2016-09-13 MED ORDER — ONDANSETRON 4 MG PO TBDP
4.0000 mg | ORAL_TABLET | Freq: Once | ORAL | Status: AC
  Administered 2016-09-13: 4 mg via ORAL
  Filled 2016-09-13: qty 1

## 2016-09-13 MED ORDER — FAMOTIDINE 20 MG PO TABS
20.0000 mg | ORAL_TABLET | Freq: Once | ORAL | Status: AC
  Administered 2016-09-13: 20 mg via ORAL
  Filled 2016-09-13: qty 1

## 2016-09-13 MED ORDER — SUCRALFATE 1 G PO TABS: 1.0000 g | ORAL_TABLET | Freq: Four times a day (QID) | ORAL | 1 refills | Status: DC

## 2016-09-13 MED ORDER — GI COCKTAIL ~~LOC~~
30.0000 mL | Freq: Once | ORAL | Status: AC
  Administered 2016-09-13: 30 mL via ORAL

## 2016-09-13 MED ORDER — PANTOPRAZOLE SODIUM 40 MG PO TBEC: 40.0000 mg | DELAYED_RELEASE_TABLET | Freq: Every day | ORAL | 1 refills | Status: DC

## 2016-09-13 MED ORDER — GI COCKTAIL ~~LOC~~
ORAL | Status: AC
  Administered 2016-09-13: 30 mL via ORAL
  Filled 2016-09-13: qty 30

## 2016-09-13 MED ORDER — ONDANSETRON 4 MG PO TBDP
4.0000 mg | ORAL_TABLET | Freq: Three times a day (TID) | ORAL | 0 refills | Status: DC | PRN
Start: 2016-09-13 — End: 2016-10-04

## 2016-09-13 NOTE — ED Provider Notes (Signed)
[facility] Emergency Department Provider Note        Time seen: ----------------------------------------- 7:43 PM on 09/13/2016 -----------------------------------------    I have reviewed the triage vital signs and the nursing notes.   HISTORY  Chief Complaint Abdominal Pain    HPI Jessica Chang is a 45 y.o. female who presents to ER for constant burning epigastric pain since midnight. Patient states she's eaten very little today and felt very nauseous but has had no more nausea this time. She denies vomiting or diarrhea. Reports she had pancreatitis in May of this year. She denies recent or heavy alcohol, has previous E had a cholecystectomy. She denies fevers, chills or other complaints.   Past Medical History:  Diagnosis Date  . Acute pancreatitis   . Cancer (HCC)    squamous cell skin cancer  . Diabetes ([place])   . Hyperlipidemia   . Hypertension   . Thyroid disease     Patient Active Problem List   Diagnosis Date Noted  . Diabetic cataract ([place]) 08/31/2016  . h/o Drug-induced acute pancreatitis (2nd [place] or onglyza) 05/27/2016  . Elevated liver enzymes 05/27/2016  . Chronic Mild Hypercalcemia 05/27/2016  . Overweight (BMI 25.0-29.9) 02/21/2016  . Pain, abdominal, nonspecific 02/21/2016  . Skin lesion 02/21/2016  . Enthesopathy of hip 10/19/2014  . Cramp in muscle 08/13/2013  . Mixed hyperlipidemia 10/29/2012  . Essential hypertension 10/29/2012  . Diabetes [facility])     Past Surgical History:  Procedure Laterality Date  . CESAREAN SECTION    . CHOLECYSTECTOMY    . HIP ARTHROSCOPY N/A   . KNEE ARTHROCENTESIS    . TONSILLECTOMY      Allergies Januvia [sitagliptin]; Onglyza [saxagliptin]; Cherry; and Lidocaine  Social History Social History  Substance Use Topics  . Smoking status: Former Smoker    Quit date: 09/19/2005  . Smokeless tobacco: Never Used     Comment: Quit 2007  . Alcohol use Yes     Comment: Drinks alcohol  four times a month.    Review of Systems Constitutional: Negative for fever. Cardiovascular: Negative for chest pain. Respiratory: Negative for shortness of breath. Gastrointestinal: Positive for abdominal pain, nausea Genitourinary: Negative for dysuria. Musculoskeletal: Negative for back pain. Skin: Negative for rash. Neurological: Negative for headaches, focal weakness or numbness.  10-point ROS otherwise negative.  ____________________________________________   PHYSICAL EXAM:  VITAL SIGNS: ED Triage Vitals [09/13/16 1735]  Enc Vitals Group     BP 122/78     Pulse Rate (!) 110     Resp 18     Temp 99 F (37.2 C)     Temp Source Oral     SpO2 99 %     Weight 175 lb (79.4 kg)     Height 5\' 7"  (1.702 m)     Head Circumference      Peak Flow      Pain Score 8     Pain Loc      Pain Edu?      Excl. in [place]?     Constitutional: Alert and oriented. Well appearing and in no distress. Eyes: Conjunctivae are normal. PERRL. Normal extraocular movements. ENT   Head: Normocephalic and atraumatic.   Nose: No congestion/rhinnorhea.   Mouth/Throat: Mucous membranes are moist.   Neck: No stridor. Cardiovascular: Normal rate, regular rhythm. No murmurs, rubs, or gallops. Respiratory: Normal respiratory effort without tachypnea nor retractions. Breath sounds are clear and equal bilaterally. No wheezes/rales/rhonchi. Gastrointestinal: Mild epigastric tenderness, no rebound or  guarding. Normal bowel sounds. Musculoskeletal: Nontender with normal range of motion in all extremities. No lower extremity tenderness nor edema. Neurologic:  Normal speech and language. No gross focal neurologic deficits are appreciated.  Skin:  Skin is warm, dry and intact. No rash noted. Psychiatric: Mood and affect are normal. Speech and behavior are normal.  ____________________________________________  EKG: Interpreted by me. Sinus tachycardia with a rate of 111 bpm, normal. We'll, normal  QRS size, normal QT, normal axis.  ____________________________________________  ED COURSE:  Pertinent labs & imaging results that were available during my care of the patient were reviewed by me and considered in my medical decision making (see chart for details). Clinical Course   Patient presents to the ER in no distress with epigastric pain. We will assess with labs and imaging.  Procedures ____________________________________________   LABS (pertinent positives/negatives)  Labs Reviewed  COMPREHENSIVE METABOLIC PANEL - Abnormal; Notable for the following:       Result Value   Sodium 129 (*)    Potassium 3.4 (*)    Chloride 97 (*)    Glucose, Bld 144 (*)    Calcium 8.6 (*)    ALT 76 (*)    All other components within normal limits  URINALYSIS, COMPLETE (UACMP) WITH MICROSCOPIC - Abnormal; Notable for the following:    Color, Urine STRAW (*)    APPearance CLEAR (*)    Glucose, UA >=500 (*)    Ketones, ur 20 (*)    All other components within normal limits  LIPASE, BLOOD  CBC  TROPONIN I  POCT PREGNANCY, URINE   ____________________________________________  FINAL ASSESSMENT AND PLAN  Epigastric pain  Plan: Patient with labs as dictated above. Patient's symptoms are likely secondary to GERD. She's been started on Pepcid and she was given a GI cocktail. We'll be discharging with Protonix, Carafate and Zofran. She is stable for outpatient follow-up.   Earleen Newport, MD   Note: This dictation was prepared with Dragon dictation. Any transcriptional errors that result from this process are unintentional    Earleen Newport, MD 09/13/16 1945

## 2016-09-13 NOTE — ED Triage Notes (Signed)
Patient presents to the ED with constant burning epigastric pain since Midnight.  Patient states she has eaten very little today and felt very nauseous but does not feel nauseous at this time.  Patient denies vomiting or diarrhea.  Patient reports history of acute pancreatitis in May.

## 2016-09-13 NOTE — ED Notes (Signed)
Pt in xray

## 2016-09-13 NOTE — Telephone Encounter (Signed)
09/13/16 Pt called stating that she was having an acute pancreatitis attack and was told to go to her specialist to have blood work done next time it happened. Pt contacted specialist and they were not in office. Pt called the office asking if we could do blood work. Informed pt that Dr. Raliegh Scarlet is out of office and will not return until 09/14/16. Pt informed to go to the ER to be evaluated. Bobetta Lime CMA, RT

## 2016-09-13 NOTE — ED Notes (Signed)
Pt to ed with c/o abd pain that started at midnight, constant burning epigastric pain.  Patient reports nausea, denies vomiting and diarrhea.  Patient reports history of acute pancreatitis in May. Pt states pain today is not as severe as it was in May.

## 2016-09-16 ENCOUNTER — Telehealth: Payer: Self-pay

## 2016-09-16 NOTE — Telephone Encounter (Signed)
LVM requesting pt call to discuss specialists. Bobetta Lime CMA, RT

## 2016-09-20 ENCOUNTER — Encounter: Payer: Self-pay | Admitting: Physician Assistant

## 2016-09-22 NOTE — Telephone Encounter (Signed)
Pt specialist have been updated. Bobetta Lime CMA, RT

## 2016-09-22 NOTE — Telephone Encounter (Signed)
LVM requesting pt call to discuss specialists. Bobetta Lime CMA, RT

## 2016-09-26 DIAGNOSIS — Z01419 Encounter for gynecological examination (general) (routine) without abnormal findings: Secondary | ICD-10-CM | POA: Diagnosis not present

## 2016-09-29 ENCOUNTER — Ambulatory Visit: Payer: 59 | Admitting: Physician Assistant

## 2016-10-04 ENCOUNTER — Ambulatory Visit (INDEPENDENT_AMBULATORY_CARE_PROVIDER_SITE_OTHER): Payer: 59 | Admitting: Physician Assistant

## 2016-10-04 ENCOUNTER — Encounter: Payer: Self-pay | Admitting: Physician Assistant

## 2016-10-04 VITALS — BP 110/72 | HR 88 | Ht 66.5 in | Wt 181.0 lb

## 2016-10-04 DIAGNOSIS — R1013 Epigastric pain: Secondary | ICD-10-CM | POA: Diagnosis not present

## 2016-10-04 DIAGNOSIS — K5900 Constipation, unspecified: Secondary | ICD-10-CM | POA: Diagnosis not present

## 2016-10-04 DIAGNOSIS — R140 Abdominal distension (gaseous): Secondary | ICD-10-CM | POA: Diagnosis not present

## 2016-10-04 MED ORDER — PANTOPRAZOLE SODIUM 40 MG PO TBEC: 40.0000 mg | DELAYED_RELEASE_TABLET | Freq: Two times a day (BID) | ORAL | 3 refills | Status: DC

## 2016-10-04 NOTE — Progress Notes (Signed)
Reviewed and agree with initial management plan.  Renwick Asman T. Aryiana Klinkner, MD FACG 

## 2016-10-04 NOTE — Progress Notes (Signed)
Chief Complaint: Epigastric Pain  HPI:  Jessica Chang is a 46 year old female with a past medical history of acute pancreatitis, diabetes, hyperlipidemia and hypertension,  who was referred to me by Jacelyn Pi, MD for a complaint of epigastric pain.   Review of outside records shows Jessica Chang had an x-ray of her abdomen two-view on 02/01/16 which showed no acute intra-abdominal abnormality. The colonic stool burden was moderate and it was thought symptoms could reflect constipation in the appropriate clinical setting.     Jessica Chang's most recent labs were completed 09/13/16 a time an ER visit for epigastric pain and show a CMP with a sodium low at 129, potassium minimally low at 3.4, chloride low at 97 and an ALT minimally elevated at 76 compared to 105 one month ago. CMP was otherwise normal. CBC on that date was normal. Lipase was also normal. Jessica Chang also had a recent DG chest 2 view which showed no active cardiopulmonary disease for burning epigastric pain on 09/13/16. At that time, Jessica Chang was discharged with Protonix, Carafate and Zofran.   Previous GI records from Dr. Benson Norway on 06/15/11 show an office visit for abnormal liver enzymes. This was thought related to fatty liver. It appears Jessica Chang was diagnosed with fatty liver via ultrasound of the abdomen on 09/01/2010.   Today, the Jessica Chang presents to clinic and complains of an epigastric pain which has been occurring since December. She tells me that this occurs 3/ 7 days of the week and is typically sharp in nature and sometimes burning. This is associated with nausea. This has been somewhat better since she has been using her Carafate twice a day and Pantoprazole 40 mg daily over the past 3 weeks. Jessica Chang tells me there is a definite correlation between the food she eats and what symptoms she experiences. She likes tacos and spicy food and when she eats these, it increases her symptoms.   Jessica Chang also describes bloating, she tells me that when she  eats she typically looks at least 6 months pregnant. Apparently she has an appointment with an allergist next week regarding possible gluten sensitivities.   Jessica Chang also describes today that 5 years ago she started with constipation. This has been worse over the past year and a half since her hip surgery. 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 Currently she uses MiraLAX on a daily basis and just switched probiotics which helps her to have a bowel movement every 2-3 days, though they are somewhat "mushy" in consistency.   Jessica Chang denies fever, chills, blood in her stool, melena, weight loss, fatigue, anorexia, vomiting or symptoms that awaken her at night.  Past Medical History:  Diagnosis Date  . Acute pancreatitis   . Diabetes ([place])   . Gallstones   . Hyperlipidemia   . Hypertension   . Pneumonia   . Squamous cell carcinoma   . Thyroid disease     Past Surgical History:  Procedure Laterality Date  . CESAREAN SECTION    . CHOLECYSTECTOMY    . HIP ARTHROSCOPY N/A   . KNEE ARTHROCENTESIS    . TONSILLECTOMY      Current Outpatient Prescriptions  Medication Sig Dispense Refill  . canagliflozin (INVOKANA) 300 MG TABS tablet Take 300 mg by mouth daily before breakfast.    . glucose blood test strip Check 2 hour postprandial and fasting blood sugars daily; also  if you feel poorly 100 each 12  . Ibuprofen 200 MG CAPS Take 600 mg by mouth as needed.     Marland Kitchen levonorgestrel (MIRENA) 20 MCG/24HR IUD Placed January, 2009     . levothyroxine (SYNTHROID, LEVOTHROID) 88 MCG tablet     . lisinopril-hydrochlorothiazide (PRINZIDE,ZESTORETIC) 20-12.5 MG tablet TAKE 1 TABLET BY MOUTH EVERY DAY 90 tablet 1  . metFORMIN (GLUCOPHAGE-XR) 500 MG 24 hr tablet Take 2 tablets by mouth 2 (two) times daily.    . pantoprazole (PROTONIX) 40 MG tablet Take 1 tablet (40 mg total) by mouth  daily. 30 tablet 1  . simvastatin (ZOCOR) 10 MG tablet Take 1 tablet by mouth daily.    . sucralfate (CARAFATE) 1 g tablet Take 1 tablet (1 g total) by mouth 4 (four) times daily. (Jessica Chang taking differently: Take 1 g by mouth 2 (two) times daily. ) 120 tablet 1   No current facility-administered medications for this visit.     Allergies as of 10/04/2016 - Review Complete 10/04/2016  Allergen Reaction Noted  . Januvia [sitagliptin] Other (See Comments) 05/27/2016  . Onglyza [saxagliptin] Other (See Comments) 05/27/2016  . Cherry Swelling 09/13/2016  . Lidocaine  06/23/2016    Family History  Problem Relation Age of Onset  . Cancer Mother     melanoma  . Heart disease Mother   . Diabetes Father   . Hyperlipidemia Father   . Hypertension Father   . Alcohol abuse Sister   . Mental illness Sister   . Cancer Paternal Aunt     melanoma  . Heart attack Paternal Aunt   . Cancer Maternal Grandmother     breast  . AAA (abdominal aortic aneurysm) Maternal Grandfather   . Diabetes Paternal Grandmother   . Cancer Paternal Grandfather     melanoma  . Stroke Paternal Grandfather     Social History   Social History  . Marital status: Married    Spouse name: N/A  . Number of children: N/A  . Years of education: N/A   Occupational History  . Not on file.   Social History Main Topics  . Smoking status: Former Smoker    Quit date: 09/19/2005  . Smokeless tobacco: Never Used     Comment: Quit 2007  . Alcohol use Yes     Comment: Drinks alcohol four times a month.  . Drug use: No  . Sexual activity: Yes    Birth control/ protection: IUD   Other Topics Concern  . Not on file   Social History Narrative  . No narrative on file    Review of Systems:    Constitutional: No weight loss, fever or chills Skin: No rash or itching Cardiovascular: No chest pain Respiratory: No SOB  Gastrointestinal: See HPI and otherwise negative Genitourinary: Positive for urine leakage    Neurological: No headache Musculoskeletal: Positive for muscle cramping  Hematologic: No bleeding or bruising Psychiatric: No history of depression or anxiety   Physical Exam:  Vital signs: BP 110/72 (BP Location: Left Arm, Jessica Chang Position: Sitting, Cuff Size: Normal)   Pulse 88   Ht 5' 6.5" (1.689 m) Comment: height measured without shoes  Wt 181 lb (82.1 kg)   BMI 28.78 kg/m    Constitutional:   Pleasant Caucasian female appears to be in NAD, Well developed, Well nourished, alert and cooperative Head:  Normocephalic and atraumatic. Eyes:   PEERL, EOMI. No icterus. Conjunctiva pink. Ears:  Normal auditory acuity. Neck:  Supple Throat: Oral cavity and pharynx  without inflammation, swelling or lesion.  Respiratory: Respirations even and unlabored. Lungs clear to auscultation bilaterally.   No wheezes, crackles, or rhonchi.  Cardiovascular: Normal S1, S2. No MRG. Regular rate and rhythm. No peripheral edema, cyanosis or pallor.  Gastrointestinal:  Soft, nondistended, mild epigastric tenderness, right lower quadrant and left lower quadrant discomfort with palpation. No rebound or guarding. Normal bowel sounds. No appreciable masses or hepatomegaly. Rectal:  Not performed.  Msk:  Symmetrical without gross deformities. Without edema, no deformity or joint abnormality.  Neurologic:  Alert and  oriented x4;  grossly normal neurologically.  Skin:   Dry and intact without significant lesions or rashes. Psychiatric: Demonstrates good judgement and reason without abnormal affect or behaviors.  RELEVANT LABS AND IMAGING: CBC    Component Value Date/Time   WBC 10.8 09/13/2016 1740   RBC 4.91 09/13/2016 1740   HGB 14.6 09/13/2016 1740   HCT 42.3 09/13/2016 1740   PLT 302 09/13/2016 1740   MCV 86.1 09/13/2016 1740   MCH 29.6 09/13/2016 1740   MCHC 34.4 09/13/2016 1740   RDW 12.9 09/13/2016 1740   LYMPHSABS 2,640 08/24/2016 0935   MONOABS 640 08/24/2016 0935   EOSABS 160 08/24/2016  0935   BASOSABS 0 08/24/2016 0935    CMP     Component Value Date/Time   NA 129 (L) 09/13/2016 1740   NA 135 (A) 08/10/2015   K 3.4 (L) 09/13/2016 1740   CL 97 (L) 09/13/2016 1740   CO2 22 09/13/2016 1740   GLUCOSE 144 (H) 09/13/2016 1740   BUN 16 09/13/2016 1740   BUN 16 10/26/2015   CREATININE 0.75 09/13/2016 1740   CREATININE 0.82 08/24/2016 0935   CALCIUM 8.6 (L) 09/13/2016 1740   PROT 7.5 09/13/2016 1740   ALBUMIN 4.6 09/13/2016 1740   AST 31 09/13/2016 1740   ALT 76 (H) 09/13/2016 1740   ALKPHOS 56 09/13/2016 1740   BILITOT 0.7 09/13/2016 1740   GFRNONAA >60 09/13/2016 1740   GFRNONAA >89 05/20/2016 0815   GFRAA >60 09/13/2016 1740   GFRAA >89 05/20/2016 0815    Assessment: 1. Epigastric pain: Over the past 2 months, currently 3/7 days of the week, some better with Pantoprazole 40 mg daily and Carafate twice a daily, likely her diet is contributory; consider likely GERD versus gastritis versus H. pylori versus other 2. Bloating: Likely with above and constipation, Jessica Chang is getting allergy testing for gluten sensitivity, discussed that this could represent IBS versus SIBO as well 3. Constipation: For the past 5 years, worse over the past year and a half, some better currently with Jessica Chang's probiotic and daily MiraLAX dosing  Plan: 1. Discussed the possibility of an EGD for further workup, Jessica Chang elects to try conservative measures at this time. 2. Recommend she increase her Pantoprazole to 40 mg twice a day, 30-60 minutes before eating breakfast and dinner, did provide her with a new prescription and 3 refills 3. Provided the Jessica Chang an antireflux diet and lifestyle modifications handout 4. Recommend the Jessica Chang continue her current probiotic and increase fiber in her diet to at least 25-35 g per day. She should also continue her increased water intake. 5. Ordered an H. pylori fecal antigen 6. If Jessica Chang has no relief of her symptoms over the next 1-2 months, will  recommend an EGD for further eval. 7. Jessica Chang to return to clinic in 1-2 months with myself or Dr. Fuller Plan.  Ellouise Newer, PA-C [facility] 10/04/2016, 1:53 PM  Cc: Jacelyn Pi, MD

## 2016-10-04 NOTE — Patient Instructions (Signed)
We have sent the following medications to your pharmacy for you to pick up at your convenience: Pantoprazole 40 mg twice a day, 30-60 mins before breakfast and dinner  Continue Probiotic  We have given you a high fiber diet handout. Please strive to have 25-30 grams of fiber daily.   We have given you an antireflux measure handout.   Your physician has requested that you go to the basement for lab work before leaving today.

## 2016-10-11 ENCOUNTER — Encounter: Payer: Self-pay | Admitting: Physician Assistant

## 2016-11-07 ENCOUNTER — Ambulatory Visit: Payer: 59 | Admitting: Gastroenterology

## 2016-12-12 ENCOUNTER — Ambulatory Visit: Payer: 59 | Admitting: Family Medicine

## 2017-01-27 ENCOUNTER — Other Ambulatory Visit: Payer: Self-pay | Admitting: Family Medicine

## 2017-02-23 ENCOUNTER — Other Ambulatory Visit: Payer: Self-pay | Admitting: Family Medicine

## 2017-02-24 ENCOUNTER — Telehealth: Payer: Self-pay | Admitting: Family Medicine

## 2017-02-24 MED ORDER — LISINOPRIL-HYDROCHLOROTHIAZIDE 20-12.5 MG PO TABS: 1.0000 | ORAL_TABLET | Freq: Every day | ORAL | 0 refills | Status: DC

## 2017-02-24 NOTE — Telephone Encounter (Signed)
Pt called states 5/11 Lisinopril HCTZ refill meds will run out on 6/12--She cld to make appt but no availability for Dr. Raliegh Scarlet till 6/18(pt on schedule for 6/18 @ 2:30)--Can a week worth of Meds be called into Pharmacy--Pls advise . --glh

## 2017-02-24 NOTE — Telephone Encounter (Signed)
Left message informing patient lisinopril HCTZ was sent to pharmacy.

## 2017-03-03 DIAGNOSIS — E039 Hypothyroidism, unspecified: Secondary | ICD-10-CM | POA: Diagnosis not present

## 2017-03-03 DIAGNOSIS — E119 Type 2 diabetes mellitus without complications: Secondary | ICD-10-CM | POA: Diagnosis not present

## 2017-03-06 ENCOUNTER — Ambulatory Visit (INDEPENDENT_AMBULATORY_CARE_PROVIDER_SITE_OTHER): Payer: 59 | Admitting: Family Medicine

## 2017-03-06 ENCOUNTER — Encounter: Payer: Self-pay | Admitting: Family Medicine

## 2017-03-06 VITALS — BP 109/77 | HR 79 | Ht 65.5 in | Wt 178.4 lb

## 2017-03-06 DIAGNOSIS — I10 Essential (primary) hypertension: Secondary | ICD-10-CM | POA: Diagnosis not present

## 2017-03-06 DIAGNOSIS — E0800 Diabetes mellitus due to underlying condition with hyperosmolarity without nonketotic hyperglycemic-hyperosmolar coma (NKHHC): Secondary | ICD-10-CM

## 2017-03-06 DIAGNOSIS — F4320 Adjustment disorder, unspecified: Secondary | ICD-10-CM

## 2017-03-06 DIAGNOSIS — F411 Generalized anxiety disorder: Secondary | ICD-10-CM | POA: Insufficient documentation

## 2017-03-06 MED ORDER — FLUOXETINE HCL 20 MG PO TABS: 20.0000 mg | ORAL_TABLET | Freq: Every day | ORAL | 3 refills | Status: DC

## 2017-03-06 NOTE — Patient Instructions (Addendum)
You can make your own 2% acetic acid solution by mixing one half water and one half white vinegar.  Use 3-5 drops in your ear 3-4 times per day if it is hurting you.   Remember is not just about popping a pill, it's about possibly obtaining a counselor\life coach also exercising to a goal of 30 minutes of moderate intensity daily.    F/up 6 wks or so--New med    What is Chronic Stress Syndrome, Symptoms & Ways to Deal With it   What is Chronic Stress Syndrome?  Chronic Stress Syndrome is something which can now be called as a medical condition due to the amount of stress an individual is going through these days. Chronic Stress Syndrome causes the body and mind to shutdown and the person has no control over himself or herself. Due to the demands of modern day life and the hardship throughout day and night takes its toll over a period of time and the body and brain starts demanding rest and a break. This leads to certain symptoms where your performance level starts to dip at work, you become irritable both at work and at home, you may stop enjoying activities you previously liked, you may become depressed, you may get angry for even small things. Chronic Stress Syndrome can significantly impact your quality life. Thus it is important understand the symptoms of Chronic Stress Syndrome and react accordingly in order to cope up with it.  It is important to note here that a balanced work-home equation should be drawn to cut down symptoms of Chronic Stress Syndrome. Minor stressors can be overcome by the body's inbuilt stress response but when there is unending stress for a long period of time then an external help is required to ease the stress.  Chronic Stress Syndrome can physically and psychologically drain you over a period of time. For such cases stress management is the best way to cope up with Chronic Stress Syndrome. If Chronic Stress Syndrome is not treated then it may result in many health  hazards like anxiety, muscle pain, insomnia, and high blood pressure along with a compromised immune system leading to frequent infections and missed days from work.    What are the Symptoms of Chronic Stress Syndrome?   The symptoms of Chronic Stress Syndrome are variable and range from generalized symptoms to emotional symptoms along with behavioral and cognitive symptoms. Some of these symptoms have been delineated below:  Generalized Symptoms of Chronic Stress Syndrome are: Anxiety Depression Social isolation Headache Abdominal pain Lack of sleep Back pain Difficulty in concentrating Hypertension Hemorrhoids Varicose veins Panic attacks/ Panic disorder Cardiovascular diseases.   Some of the Emotional Symptoms of Chronic Stress Syndrome are: To become easily agitated, moody and frustrated Feeling overwhelmed which makes you feel like you are losing control. Having difficulty relaxing and have a peaceful mind Having low self esteem Feeling lonely Feeling worthless Feeling depressed Avoiding social environment.   Some of the Physical Symptoms of Chronic Stress Syndrome are: Headaches Lethargy Alternating diarrhea and constipation Nausea Muscles aches and pains Insomnia Rapid heartbeat and chest pain Infections and frequent colds Decreased libido Nervousness and shaking Tinnitus Sweaty palms Dry mouth Clenched jaw.  Some of the Cognitive Symptoms of Chronic Stress Syndrome are: Constant worrying Racing thoughts Disorganization and forgetfulness Inability to focus Poor judgment Abundance of negativity.  Some of the Behavioral Symptoms of Chronic Stress Syndrome are: Changes in appetite with less desire to eat Avoiding responsibilities Indulgence in alcohol or  recreational drug use Increased nail biting and being fidgety Ways to Deal With Chronic Stress Syndrome    Chronic Stress Syndrome is not something which cannot be addressed. A bit of effort  from your side in the form of lifestyle modifications, a little bit of exercise, a balanced work life equation can do wonders and help you get rid of Chronic Stress Syndrome.  Get Proper Sleep: It has been proved that Chronic Stress Syndrome causes loss of sleep where an individual may not even be able to sleep for days unending. This may result in the individual feeling lethargic and unable to focus at work the following morning. This may lead to decreased performance at work. Thus, it is important to have a good sleep-wake cycle. For this, try and not drink any caffeinated beverage about four hours prior to going to sleep, as caffeine pumps up the adrenaline and causes you to stay awake resulting ultimately in Chronic Stress Syndrome.  Avoid Alcohol and Drugs: Another way to get rid of Chronic Stress Syndrome is lifestyle modifications. Stay away from alcohol and other recreational drugs. Take Short Frequent Breaks at Work: Try to take frequent breaks from work and do not work continuously. Try and manage your work in such a way that you even meet your deadline and come home on time for a happy dinner with family. A good time spent with family and kids does wonders in not only dealing with Chronic Stress Syndrome but also preventing it.  Become Physically Active: Another step towards getting rid of Chronic Stress Syndrome is physical activity. If you do not have time to spend at the gym then at least try and go for daily walks for about half an hour a day which not only keeps the stress away but also is good for your overall health. Physical activity leads to production of endorphins which will make you feel relaxed and feel good.  Healthy Diet Can Help You Deal With Chronic Stress Syndrome: Have a balanced and healthy diet is another step towards a stress free life and keeping Chronic Stress Syndrome at [place]. If time is a constraint then you can try eating three small meals a day. Try and avoid fast foods  and take foods which are healthy and rich in proteins, fiber, and carbohydrates to boost your energy system.  Music Can Soothe Your Mind: Light music is one of the best and most effective relaxation techniques that one can try to overcome stress. It has shown to calm down the mind and take you away from all the stressors that you may be having. These days it is also being used as a therapy in some institutes for overcoming stress. It is important here to discuss the importance of a good social support system for patients with Chronic Stress Syndrome, as a good social support framework can do wonders in taking the stress away from the patient and overcoming Chronic Stress Syndrome.  Meditation Can Help You Deal With Chronic Stress Syndrome Effectively: Meditation and yoga has also shown to be quite effective in relaxing the mind and coping up with Chronic Stress Syndrome   In cases where these measures are not helpful, then it is time for you to consult with a skilled psychologist or a psychiatrist for potential therapies or medications to control the stress response.   The psychologist can help you with a variety of steps for coping up with Chronic Stress Syndrome. Relaxation techniques and behavioral therapy are some of the methods  employed by psychologists. In some cases, medications can also be given to help relax the patient.  Since Chronic Stress Syndrome is both emotionally and physically draining for the patient and it also adversely affects the family life of the patient hence it is important for the patient to recognize the condition and taking steps to cope up with it. Escaping measures like alcohol and drug use are of no help as they only aggravate the condition apart from their other health hazards. If this condition is ignored or left untreated it can lead to various medical conditions like anxiety and depression and various other medical conditions.  Last but not least, smile as often as  you can as it is the best gift that you can give to someone. The best way to stay relaxed is to have a good smile, exercise daily, spend time with your family, meditation and if required consultation with a good psychologist so that you can live a stress free life and overcome the symptoms of Chronic Stress Syndrome.

## 2017-03-06 NOTE — Assessment & Plan Note (Signed)
Well-controlled continue BP meds

## 2017-03-06 NOTE — Assessment & Plan Note (Signed)
After long discussion we decided to go with fluoxetine.  Patient will start with one half tab daily for 1 week then increase to one tab daily.  Discussed importance of life coach, exercise, prudent diet and adequate sleep.  Follow-up 6 weeks for recheck of mood

## 2017-03-06 NOTE — Assessment & Plan Note (Signed)
Asked patient to get me her medical records from her endocrinologist of her recent A1c.  Last A1c we have 6 months ago was 7.0.  Blood sugars have been even better lately per patient, but asked her to get me med records.

## 2017-03-06 NOTE — Progress Notes (Signed)
Impression and Recommendations:    1. Adjustment disorder, unspecified type   2. Essential hypertension   3. Diabetes mellitus due to underlying condition with hyperosmolarity without coma, without long-term current use of insulin ([place])     Essential hypertension Well-controlled continue BP meds  Diabetes ([place]) Asked patient to get me her medical records from her endocrinologist of her recent A1c.  Last A1c we have 6 months ago was 7.0.  Blood sugars have been even better lately per patient, but asked her to get me med records.  Adjustment disorder After long discussion we decided to go with fluoxetine.  Patient will start with one half tab daily for 1 week then increase to one tab daily.  Discussed importance of life coach, exercise, prudent diet and adequate sleep.  Follow-up 6 weeks for recheck of mood     Education and routine counseling performed. Handouts provided.   New Prescriptions   FLUOXETINE (PROZAC) 20 MG TABLET    Take 1 tablet (20 mg total) by mouth daily.    Discontinued Medications   LEVOTHYROXINE (SYNTHROID, LEVOTHROID) 88 MCG TABLET       PANTOPRAZOLE (PROTONIX) 40 MG TABLET    Take 1 tablet (40 mg total) by mouth 2 (two) times daily before a meal.     No orders of the defined types were placed in this encounter.    Return in about 6 weeks (around 04/17/2017) for started fluoxetine.  The patient was counseled, risk factors were discussed, anticipatory guidance given.  Gross side effects, risk and benefits, and alternatives of medications discussed with patient.  Patient is aware that all medications have potential side effects and we are unable to predict every side effect or drug-drug interaction that may occur.  Expresses verbal understanding and consents to current therapy plan and treatment regimen.  Please see AVS handed out to patient at the end of our visit for further patient instructions/ counseling done pertaining to today's  office visit.    Note: This document was prepared using Dragon voice recognition software and may include unintentional dictation errors.     Subjective:    Chief Complaint  Patient presents with  . Hypertension    follow up     HPI: Jessica Chang is a 46 y.o. female who presents to [place] [facility] today for follow up for HTN.    DM-- seeing Dr Chalmers Cater.  Eating healthy- low carb diet.   Got bldwrk at her office recently - not sure of results yet- will get them to [place].     -->   Gluten free diet  Hypothyroidism- being txed by Endo as well.    Problem  Essential Hypertension  Diabetes ([facility])  Adjustment Disorder    Emotionally:  Pt thinks she either needs an anti-dep or divorce.  Crying all the time- all emotions.  Desires it to be blunted.  Feels like she lacks motivation,. Hard to get out of bed.   Takes naps QD.   Not exercising.    Lacks empathy, lacks filter.  -  Mom- depression, brother anxiety, sister- bipolar - bad per pt.   HTN: -  Her blood pressure - not checking at home. - Patient reports good compliance with blood pressure medications - Denies medication S-E - Smoking Status noted  - She denies new onset of: chest pain, exercise intolerance, shortness of breath, dizziness, visual changes, headache, lower extremity swelling or claudication.  Today their BP is BP: 109/77  Last 3 blood pressure readings in our office are as follows: BP Readings from Last 3 Encounters:  03/06/17 109/77  10/04/16 110/72  09/13/16 113/78    Pulse Readings from Last 3 Encounters:  03/06/17 79  10/04/16 88  09/13/16 97    Filed Weights   03/06/17 1430  Weight: 178 lb 6.4 oz (80.9 kg)      Patient Care Team    Relationship Specialty Notifications Start End  Mellody Dance, DO PCP - General Family Medicine  02/18/16   [place], Amy, MD Consulting Physician Dermatology  05/25/16    Comment: Dalphine Handing, MD Referring Physician  Psychiatry  08/31/16    Comment: neuropsychiatry  Chari Manning, MD Attending Physician Orthopedic Surgery  09/22/16   Levin Erp, PA Physician Assistant Gastroenterology  09/22/16   Jacelyn Pi, MD Consulting Physician Endocrinology  09/22/16   Marylynn Pearson, MD Consulting Physician Obstetrics and Gynecology  09/22/16   Macarthur Critchley, OD Referring Physician Optometry  09/22/16      Lab Results  Component Value Date   CREATININE 0.75 09/13/2016   BUN 16 09/13/2016   NA 129 (L) 09/13/2016   K 3.4 (L) 09/13/2016   CL 97 (L) 09/13/2016   CO2 22 09/13/2016    Lab Results  Component Value Date   CHOL 186 05/20/2016   CHOL 146 08/26/2013    Lab Results  Component Value Date   HDL 47 05/20/2016   HDL 37 08/26/2013    Lab Results  Component Value Date   LDLCALC 111 05/20/2016   [place] 62 08/26/2013    Lab Results  Component Value Date   TRIG 138 05/20/2016   TRIG 235 (A) 08/26/2013    Lab Results  Component Value Date   CHOLHDL 4.0 05/20/2016    No results found for: LDLDIRECT ===================================================================   Patient Active Problem List   Diagnosis Date Noted  . Mixed hyperlipidemia 10/29/2012    Priority: High  . Essential hypertension 10/29/2012    Priority: High  . Diabetes ([place])     Priority: High  . Adjustment disorder 03/06/2017  . Diabetic cataract ([place]) 08/31/2016  . h/o Drug-induced acute pancreatitis (2nd [place] or onglyza) 05/27/2016  . Elevated liver enzymes 05/27/2016  . Chronic Mild Hypercalcemia 05/27/2016  . Overweight (BMI 25.0-29.9) 02/21/2016  . Pain, abdominal, nonspecific 02/21/2016  . Skin lesion 02/21/2016  . Enthesopathy of hip 10/19/2014  . Cramp in muscle 08/13/2013     Past Medical History:  Diagnosis Date  . Acute pancreatitis   . Diabetes ([place])   . Gallstones   . Hyperlipidemia   . Hypertension   . Pneumonia   . Squamous cell carcinoma   . Thyroid disease       Past Surgical History:  Procedure Laterality Date  . CESAREAN SECTION  2008  . CHOLECYSTECTOMY    . HIP ARTHROSCOPY Right 2011, 2016  . KNEE ARTHROSCOPY Right 1988  . TONSILLECTOMY  1989     Family History  Problem Relation Age of Onset  . Heart disease Mother   . Basal cell carcinoma Mother   . Diabetes Father   . Hyperlipidemia Father   . Hypertension Father   . Colon polyps Father   . Prostate cancer Father   . Melanoma Father   . Alcohol abuse Sister   . Mental illness Sister   . Heart attack Paternal Aunt   . Melanoma Paternal Aunt   . Breast cancer Maternal Grandmother   .  AAA (abdominal aortic aneurysm) Maternal Grandfather   . Diabetes Paternal Grandmother   . Stroke Paternal Grandfather   . Melanoma Paternal Grandfather      History  Drug Use No  ,  History  Alcohol Use  . Yes    Comment: Drinks alcohol four times a month.  ,  History  Smoking Status  . Former Smoker  . Packs/day: 0.25  . Years: 5.00  . Quit date: 09/19/2005  Smokeless Tobacco  . Never Used    Comment: Quit 2007  ,    Current Outpatient Prescriptions on File Prior to Visit  Medication Sig Dispense Refill  . canagliflozin (INVOKANA) 300 MG TABS tablet Take 300 mg by mouth daily before breakfast.    . glucose blood test strip Check 2 hour postprandial and fasting blood sugars daily; also if you feel poorly 100 each 12  . Ibuprofen 200 MG CAPS Take 600 mg by mouth as needed.     Marland Kitchen levonorgestrel (MIRENA) 20 MCG/24HR IUD Placed January, 2009     . lisinopril-hydrochlorothiazide (PRINZIDE,ZESTORETIC) 20-12.5 MG tablet Take 1 tablet by mouth daily. Patient must have office visit prior to any further refills 30 tablet 0  . metFORMIN (GLUCOPHAGE-XR) 500 MG 24 hr tablet Take 2 tablets by mouth 2 (two) times daily.    . simvastatin (ZOCOR) 10 MG tablet Take 1 tablet by mouth daily.    . sucralfate (CARAFATE) 1 g tablet Take 1 tablet (1 g total) by mouth 4 (four) times daily. (Patient  taking differently: Take 1 g by mouth 2 (two) times daily. ) 120 tablet 1   No current facility-administered medications on file prior to visit.      Allergies  Allergen Reactions  . Januvia [Sitagliptin] Other (See Comments)    Pancreatitis  . Onglyza [Saxagliptin] Other (See Comments)    Pancreatitis to all DPP 4 inhibitor medications  . Cherry Swelling and Itching  . Lidocaine     Other reaction(s): Other (See Comments) Blisters     Review of Systems:   General:  Denies fever, chills Optho/Auditory:   Denies visual changes, blurred vision Respiratory:   Denies SOB, cough, wheeze, DIB  Cardiovascular:   Denies chest pain, palpitations, painful respirations Gastrointestinal:   Denies nausea, vomiting, diarrhea.  Endocrine:     Denies new hot or cold intolerance Musculoskeletal:  Denies joint swelling, gait issues, or new unexplained myalgias/ arthralgias Skin:  Denies rash, suspicious lesions  Neurological:    Denies dizziness, unexplained weakness, numbness  Psychiatric/Behavioral:   Denies mood changes  Objective:    Blood pressure 109/77, pulse 79, height 5' 5.5" (1.664 m), weight 178 lb 6.4 oz (80.9 kg).  Body mass index is 29.24 kg/m.  General: Well Developed, well nourished, and in no acute distress.  HEENT: Normocephalic, atraumatic, pupils equal round reactive to light, neck supple, No carotid bruits, no JVD Skin: Warm and dry, cap RF less 2 sec Cardiac: Regular rate and rhythm, S1, S2 WNL's, no murmurs rubs or gallops Respiratory: ECTA B/L, Not using accessory muscles, speaking in full sentences. NeuroM-Sk: Ambulates w/o assistance, moves ext * 4 w/o difficulty, sensation grossly intact.  Ext: scant edema b/l lower ext Psych: No HI/SI, judgement and insight good, Euthymic mood. Full Affect.

## 2017-03-06 NOTE — Assessment & Plan Note (Signed)
>>  ASSESSMENT AND PLAN FOR DIABETES ([facility]CC) WRITTEN ON 03/06/2017  3:11 PM BY OPALSKI, DEBORAH, DO  Asked patient to get me her medical records from her endocrinologist of her recent A1c.  Last A1c we have 6 months ago was 7.0.  Blood sugars have been even better lately per patient, but asked her to get me med records.

## 2017-03-07 DIAGNOSIS — I10 Essential (primary) hypertension: Secondary | ICD-10-CM | POA: Diagnosis not present

## 2017-03-07 DIAGNOSIS — E7800 Pure hypercholesterolemia, unspecified: Secondary | ICD-10-CM | POA: Diagnosis not present

## 2017-03-07 DIAGNOSIS — E119 Type 2 diabetes mellitus without complications: Secondary | ICD-10-CM | POA: Diagnosis not present

## 2017-03-13 ENCOUNTER — Encounter: Payer: Self-pay | Admitting: Family Medicine

## 2017-03-14 DIAGNOSIS — I10 Essential (primary) hypertension: Secondary | ICD-10-CM | POA: Diagnosis not present

## 2017-03-24 ENCOUNTER — Other Ambulatory Visit: Payer: Self-pay | Admitting: Family Medicine

## 2017-04-19 ENCOUNTER — Encounter: Payer: Self-pay | Admitting: Family Medicine

## 2017-04-19 ENCOUNTER — Ambulatory Visit (INDEPENDENT_AMBULATORY_CARE_PROVIDER_SITE_OTHER): Payer: 59 | Admitting: Family Medicine

## 2017-04-19 VITALS — BP 114/77 | HR 68 | Ht 65.5 in | Wt 176.3 lb

## 2017-04-19 DIAGNOSIS — E663 Overweight: Secondary | ICD-10-CM | POA: Diagnosis not present

## 2017-04-19 DIAGNOSIS — E0800 Diabetes mellitus due to underlying condition with hyperosmolarity without nonketotic hyperglycemic-hyperosmolar coma (NKHHC): Secondary | ICD-10-CM | POA: Diagnosis not present

## 2017-04-19 DIAGNOSIS — F4320 Adjustment disorder, unspecified: Secondary | ICD-10-CM | POA: Diagnosis not present

## 2017-04-19 DIAGNOSIS — I10 Essential (primary) hypertension: Secondary | ICD-10-CM

## 2017-04-19 MED ORDER — FLUOXETINE HCL 20 MG PO TABS: 20.0000 mg | ORAL_TABLET | Freq: Every day | ORAL | 1 refills | Status: DC

## 2017-04-19 NOTE — Progress Notes (Signed)
Impression and Recommendations:    1. Adjustment disorder, unspecified type   2. Diabetes mellitus due to underlying condition with hyperosmolarity without coma, without long-term current use of insulin ([place])   3. Essential hypertension   4. Overweight (BMI 25.0-29.9)    - RF prozac.  Doing well, sx stable- will cont to monitor.  - f/up Endo for DM as instructed q 73mo there and q 81mo here- to be seen q 58mo with labs  - cont BP and DM meds   The patient was counseled, risk factors were discussed, anticipatory guidance given.  Meds ordered this encounter  Medications  . FLUoxetine (PROZAC) 20 MG tablet    Sig: Take 1 tablet (20 mg total) by mouth daily.    Dispense:  90 tablet    Refill:  1   Gross side effects, risk and benefits, and alternatives of medications and treatment plan in general discussed with patient.  Patient is aware that all medications have potential side effects and we are unable to predict every side effect or drug-drug interaction that may occur.   Patient will call with any questions prior to using medication if they have concerns.  Expresses verbal understanding and consents to current therapy and treatment regimen.  No barriers to understanding were identified.  Red flag symptoms and signs discussed in detail.  Patient expressed understanding regarding what to do in case of emergency\urgent symptoms  Please see AVS handed out to patient at the end of our visit for further patient instructions/ counseling done pertaining to today's office visit.   Return in about 6 months (around 10/20/2017) for go to Endo q 24mo and me q 6 mo in btwn.     Note: This document was prepared using Dragon voice recognition software and may include unintentional dictation errors.  Mellody Dance 3:26  PM --------------------------------------------------------------------------------------------------------------------------------------------------------------------------------------------------------------------------------------------    Subjective:    CC:  Chief Complaint  Patient presents with  . Follow-up    HPI: Jessica Chang is a 46 y.o. female who presents to [facility] today for issues as discussed below.   Mood f/up:  Started prozac last OV--> tol well and no s-e.  Feels so much better.  Desires to stay on current dose.  Sleeping well, has more energy than prior.     DM:  Doing well with sugars.  Following with ENDO.   HTN:  Been well controlled at home. No Sx. ROS Negative   Wt Readings from Last 3 Encounters:  06/19/17 177 lb 9.6 oz (80.6 kg)  04/19/17 176 lb 4.8 oz (80 kg)  03/06/17 178 lb 6.4 oz (80.9 kg)   BP Readings from Last 3 Encounters:  06/19/17 110/75  04/19/17 114/77  03/06/17 109/77   Pulse Readings from Last 3 Encounters:  06/19/17 83  04/19/17 68  03/06/17 79   BMI Readings from Last 3 Encounters:  06/19/17 28.24 kg/m  04/19/17 28.89 kg/m  03/06/17 29.24 kg/m     Patient Care Team    Relationship Specialty Notifications Start End  Mellody Dance, DO PCP - General Family Medicine  02/18/16   [place], Amy, MD Consulting Physician Dermatology  05/25/16    Comment: Dalphine Handing, MD Referring Physician Psychiatry  08/31/16    Comment: neuropsychiatry  Chari Manning, MD Attending Physician Orthopedic Surgery  09/22/16   Levin Erp, [place] Physician Assistant Gastroenterology  09/22/16   Jacelyn Pi, MD Consulting Physician Endocrinology  09/22/16   Marylynn Pearson, MD Consulting Physician Obstetrics and Gynecology  09/22/16   Macarthur Critchley, [place] Referring Physician Optometry  09/22/16      Patient Active Problem List   Diagnosis Date Noted  . Hyperlipidemia associated with type 2  diabetes mellitus ([place]) 10/29/2012    Priority: High  . Hypertension associated with diabetes ([place]) 10/29/2012    Priority: High  . Diabetes ([place])     Priority: High  . Vitamin D deficiency 06/19/2017  . Adjustment disorder 03/06/2017  . Diabetic cataract ([place]) 08/31/2016  . h/o Drug-induced acute pancreatitis (2nd [place] or onglyza) 05/27/2016  . Elevated liver enzymes 05/27/2016  . Chronic Mild Hypercalcemia 05/27/2016  . Overweight (BMI 25.0-29.9) 02/21/2016  . Pain, abdominal, nonspecific 02/21/2016  . Skin lesion 02/21/2016  . Enthesopathy of hip 10/19/2014  . Cramp in muscle 08/13/2013    Past Medical history, Surgical history, Family history, Social history, Allergies and Medications have been entered into the medical record, reviewed and changed as needed.    Current Meds  Medication Sig  . canagliflozin (INVOKANA) 300 MG TABS tablet Take 300 mg by mouth daily before breakfast.  . FLUoxetine (PROZAC) 20 MG tablet Take 1 tablet (20 mg total) by mouth daily.  Marland Kitchen glucose blood test strip Check 2 hour postprandial and fasting blood sugars daily; also if you feel poorly  . Ibuprofen 200 MG CAPS Take 600 mg by mouth as needed.   Marland Kitchen levonorgestrel (MIRENA) 20 MCG/24HR IUD Placed January, 2009   . levothyroxine (SYNTHROID, LEVOTHROID) 100 MCG tablet Take 100 mcg by mouth daily.  Marland Kitchen lisinopril (PRINIVIL,ZESTRIL) 20 MG tablet Take 20 mg by mouth daily.  . metFORMIN (GLUCOPHAGE-XR) 500 MG 24 hr tablet Take 2 tablets by mouth 2 (two) times daily.  . simvastatin (ZOCOR) 10 MG tablet Take 1 tablet by mouth daily.  . [DISCONTINUED] FLUoxetine (PROZAC) 20 MG tablet Take 1 tablet (20 mg total) by mouth daily.    Allergies:  Allergies  Allergen Reactions  . Januvia [Sitagliptin] Other (See Comments)    Pancreatitis  . Onglyza [Saxagliptin] Other (See Comments)    Pancreatitis to all DPP 4 inhibitor medications  . Cherry Swelling and Itching  . Lidocaine     Other reaction(s): Other  (See Comments) Blisters     Review of Systems: General:   Denies fever, chills, unexplained weight loss.  Optho/Auditory:   Denies visual changes, blurred vision/LOV Respiratory:   Denies wheeze, DOE more than baseline levels.  Cardiovascular:   Denies chest pain, palpitations, new onset peripheral edema  Gastrointestinal:   Denies nausea, vomiting, diarrhea, abd pain.  Genitourinary: Denies dysuria, freq/ urgency, flank pain or discharge from genitals.  Endocrine:     Denies hot or cold intolerance, polyuria, polydipsia. Musculoskeletal:   Denies unexplained myalgias, joint swelling, unexplained arthralgias, gait problems.  Skin:  Denies new onset rash, suspicious lesions Neurological:     Denies dizziness, unexplained weakness, numbness  Psychiatric/Behavioral:   Denies mood changes, suicidal or homicidal ideations, hallucinations    Objective:   Blood pressure 114/77, pulse 68, height 5' 5.5" (1.664 m), weight 176 lb 4.8 oz (80 kg). Body mass index is 28.89 kg/m. General:  Well Developed, well nourished, appropriate for stated age.  Neuro:  Alert and oriented,  extra-ocular muscles intact  HEENT:  Normocephalic, atraumatic, neck supple, no carotid bruits appreciated  Skin:  no gross rash, warm, pink. Cardiac:  RRR, S1 S2 Respiratory:  ECTA B/L and A/P, Not using accessory muscles, speaking in  full sentences- unlabored. Vascular:  Ext warm, no cyanosis apprec.; cap RF less 2 sec. Psych:  No HI/SI, judgement and insight good, Euthymic mood. Full Affect.

## 2017-04-19 NOTE — Patient Instructions (Signed)
How to Treat Vertigo at Home with Exercises ? ?What is Vertigo? ? ?Vertigo is a relatively common symptom most often associated with conditions such as sinusitis (inflammation of your sinuses due to viruses, allergies, or bacterial infections), or an inner ear infection or ear trauma.   It can be brought on by trauma (e.g. a blow to the head or whiplash) or more serious things like minor strokes.   Symptoms can also be brought on by normal degenerative changes to your inner ear that occur with aging.  The condition tends to be more commonly seen in the elderly but it can occur in all ages.   ? ?Patients most often complain of dizziness, as if the room is spinning around them.   Symptoms are provoked by quick head movements or changes in position like going from standing to lying in bed, or even turning over in bed.   It may present with nausea and/or vomiting, and can be very debilitating to some folks.   ? ?By far the most common cause, known as Benign Paroxysmal Positional Vertigo (BPPV), is categorized by a sudden onset of symptoms, that are intense but short-lived (60 seconds or less), which is triggered by a change in head position.   Symptoms usually dissipate if you stay in one position and do not move your head.   Within the inner ear are collections of calcium carbonate crystals referred to as ?otoliths? which may become dislodged from their normal position and migrate into the semicircular canals of the inner ear, throwing off your body's ability to sense where you are in space.   ? ? ?Fig. 921 Anatomy of the Right Osseous Labyrinth. Henry Gray. Anatomy of the Human Body. 1918. ? ?  ? ?  ? ?  ? ? ?What Else Could Be Behind My Vertigo? ? ?Some other causes of vertigo include: ? ?Meniere?s disease (disorder of inner ear with ringing in ears, feeling of fullness/pressure within ear, and fluctuating hearing loss) ?Tumours ?Neurological disorders e.g. Multiple Sclerosis ?Motion Sickness (lack of coordination  between visual stimuli, inner ear balance and positional sense) ?Migraine ?Labyrinthitis (inflammation of the fluid-filled tubes and sacs within the inner ear; may also be associated with changes in hearing) ?Vestibular neuritis (inflammation of the nerves associated with transmission of sensory info from the inner ear; usually of viral origins) ? ?How it can be treated/cured? ?While certain medications have been prescribed for vertigo including Lorazepam your doing well 7 house the house going organizing and getting things ready for sale with the and Meclizine (for motion sickness), there exists no evidence to support a recommendation of any medication in the routine treatment of BPPV.  Clinical trials have demonstrated that repositioning techniques (listed below) are a superior option for management (Fife et al., 2008). ? ? ? ?Figure above:  (A) Instructions for the modified Epley procedure (MEP) for left ear posterior canal benign paroxysmal positional vertigo (PC-BPPV). For right ear BPPV, the procedure has to be performed in the opposite direction, starting with the head turned to the right side.  ?1. Start by sitting on a bed with your head turned 45? to the left. Place a pillow behind you so that on lying back it will be under your shoulders.  ?2. Lie back quickly with shoulders on the pillow, neck extended, and head resting on the bed. In this position, the affected (left) ear is underneath. Wait for 30 secondS.  ?3. Turn your head 90? to the right (without raising it), and   wait again for 30 seconds.  ?4. Turn your body and head another 90? to the right, and wait for another 30 seconds.  ?5. Sit up on the right side. This maneuver should be performed three times a day. Repeat this daily until you are free from positional vertigo for 24 hours. ? ? (B) Instructions for the modified Semont maneuver (MSM) for left ear PC-BPPV. For right ear BPPV, the maneuver has to be performed in the opposite direction,  starting with the head turned toward the left ear.  ?1. Sit upright on a bed with your head turned 45? toward the right ear.  ?2. Drop quickly to the left side, so that your head touches the bed behind your left ear. Wait 30 seconds.  ?3. Move head and trunk in a swift movement toward the other side without stopping in the upright position, so that your head comes to rest on the right side of your forehead. Wait again for 30 seconds.  ?4. Sit up again.  ?This maneuver should be performed three times a day. Repeat this daily until you are free from positional vertigo symptoms for 24 hours.  ? ?(   See the video in the supplementary material on the NeurologyWeb site; go to http://www.neurology.org/content/63/1/150/F1.expansion.html   ) ? ? ? ? ?You can also try this motion at home as well- ?Self-Treatment of Benign Paroxysmal Positional Vertigo ?Benign Paroxysmal Positioning Vertigo is caused by loose inner ear crystals in the inner ear that migrate while sleeping to the back-bottom inner ear balance canal, the so-called ?posterior semi-circular canal.? The maneuver demonstrated below is the way to reposition the loose crystals so that the symptoms caused by the loose crystals go away. You may have a floating, swaying sense while walking or sitting for a few days after this procedure.  ? ? ? ? ? ? ? ? ? ? ?

## 2017-06-16 ENCOUNTER — Other Ambulatory Visit (INDEPENDENT_AMBULATORY_CARE_PROVIDER_SITE_OTHER): Payer: 59

## 2017-06-16 ENCOUNTER — Other Ambulatory Visit: Payer: Self-pay

## 2017-06-16 DIAGNOSIS — R748 Abnormal levels of other serum enzymes: Secondary | ICD-10-CM

## 2017-06-16 DIAGNOSIS — E782 Mixed hyperlipidemia: Secondary | ICD-10-CM

## 2017-06-16 DIAGNOSIS — I10 Essential (primary) hypertension: Secondary | ICD-10-CM

## 2017-06-16 DIAGNOSIS — K5900 Constipation, unspecified: Secondary | ICD-10-CM

## 2017-06-16 DIAGNOSIS — R140 Abdominal distension (gaseous): Secondary | ICD-10-CM

## 2017-06-16 DIAGNOSIS — E0800 Diabetes mellitus due to underlying condition with hyperosmolarity without nonketotic hyperglycemic-hyperosmolar coma (NKHHC): Secondary | ICD-10-CM

## 2017-06-16 DIAGNOSIS — R1013 Epigastric pain: Secondary | ICD-10-CM

## 2017-06-16 NOTE — Addendum Note (Signed)
Addended by: Lanier Prude D on: 06/16/2017 08:37 AM   Modules accepted: Orders

## 2017-06-17 LAB — CBC WITH DIFFERENTIAL
BASOS: 0 %
Basophils Absolute: 0 10*3/uL (ref 0.0–0.2)
EOS (ABSOLUTE): 0.2 10*3/uL (ref 0.0–0.4)
EOS: 3 %
HEMATOCRIT: 41.8 % (ref 34.0–46.6)
HEMOGLOBIN: 13.9 g/dL (ref 11.1–15.9)
IMMATURE GRANULOCYTES: 0 %
Immature Grans (Abs): 0 10*3/uL (ref 0.0–0.1)
LYMPHS ABS: 2.6 10*3/uL (ref 0.7–3.1)
Lymphs: 35 %
MCH: 29.4 pg (ref 26.6–33.0)
MCHC: 33.3 g/dL (ref 31.5–35.7)
MCV: 88 fL (ref 79–97)
MONOCYTES: 7 %
Monocytes Absolute: 0.5 10*3/uL (ref 0.1–0.9)
NEUTROS ABS: 4 10*3/uL (ref 1.4–7.0)
Neutrophils: 55 %
RBC: 4.73 x10E6/uL (ref 3.77–5.28)
RDW: 13.3 % (ref 12.3–15.4)
WBC: 7.4 10*3/uL (ref 3.4–10.8)

## 2017-06-17 LAB — COMPREHENSIVE METABOLIC PANEL
ALBUMIN: 5.1 g/dL (ref 3.5–5.5)
ALK PHOS: 61 IU/L (ref 39–117)
ALT: 90 IU/L — ABNORMAL HIGH (ref 0–32)
AST: 54 IU/L — ABNORMAL HIGH (ref 0–40)
Albumin/Globulin Ratio: 2 (ref 1.2–2.2)
BUN / CREAT RATIO: 19 (ref 9–23)
BUN: 15 mg/dL (ref 6–24)
Bilirubin Total: 0.4 mg/dL (ref 0.0–1.2)
CO2: 20 mmol/L (ref 20–29)
CREATININE: 0.81 mg/dL (ref 0.57–1.00)
Calcium: 10 mg/dL (ref 8.7–10.2)
Chloride: 101 mmol/L (ref 96–106)
GFR calc Af Amer: 101 mL/min/{1.73_m2} (ref >59)
GFR calc non Af Amer: 88 mL/min/{1.73_m2} (ref >59)
GLOBULIN, TOTAL: 2.5 g/dL (ref 1.5–4.5)
Glucose: 110 mg/dL — ABNORMAL HIGH (ref 65–99)
Potassium: 4.9 mmol/L (ref 3.5–5.2)
SODIUM: 139 mmol/L (ref 134–144)
Total Protein: 7.6 g/dL (ref 6.0–8.5)

## 2017-06-17 LAB — LIPID PANEL
Chol/HDL Ratio: 3.9 ratio (ref 0.0–4.4)
Cholesterol, Total: 177 mg/dL (ref 100–199)
HDL: 45 mg/dL (ref >39)
LDL CALC: 108 mg/dL — AB (ref 0–99)
Triglycerides: 122 mg/dL (ref 0–149)
VLDL CHOLESTEROL CAL: 24 mg/dL (ref 5–40)

## 2017-06-17 LAB — TSH: TSH: 0.772 u[IU]/mL (ref 0.450–4.500)

## 2017-06-17 LAB — HEMOGLOBIN A1C
Est. average glucose Bld gHb Est-mCnc: 143 mg/dL
HEMOGLOBIN A1C: 6.6 % — AB (ref 4.8–5.6)

## 2017-06-17 LAB — VITAMIN D 25 HYDROXY (VIT D DEFICIENCY, FRACTURES): Vit D, 25-Hydroxy: 28.6 ng/mL — ABNORMAL LOW (ref 30.0–100.0)

## 2017-06-19 ENCOUNTER — Ambulatory Visit (INDEPENDENT_AMBULATORY_CARE_PROVIDER_SITE_OTHER): Payer: 59 | Admitting: Family Medicine

## 2017-06-19 ENCOUNTER — Encounter: Payer: Self-pay | Admitting: Family Medicine

## 2017-06-19 VITALS — BP 110/75 | HR 83 | Ht 66.5 in | Wt 177.6 lb

## 2017-06-19 DIAGNOSIS — E1169 Type 2 diabetes mellitus with other specified complication: Secondary | ICD-10-CM

## 2017-06-19 DIAGNOSIS — R748 Abnormal levels of other serum enzymes: Secondary | ICD-10-CM | POA: Diagnosis not present

## 2017-06-19 DIAGNOSIS — I10 Essential (primary) hypertension: Secondary | ICD-10-CM

## 2017-06-19 DIAGNOSIS — E1159 Type 2 diabetes mellitus with other circulatory complications: Secondary | ICD-10-CM

## 2017-06-19 DIAGNOSIS — E559 Vitamin D deficiency, unspecified: Secondary | ICD-10-CM | POA: Insufficient documentation

## 2017-06-19 DIAGNOSIS — E785 Hyperlipidemia, unspecified: Secondary | ICD-10-CM

## 2017-06-19 DIAGNOSIS — E0800 Diabetes mellitus due to underlying condition with hyperosmolarity without nonketotic hyperglycemic-hyperosmolar coma (NKHHC): Secondary | ICD-10-CM | POA: Diagnosis not present

## 2017-06-19 MED ORDER — VITAMIN D3 125 MCG (5000 UT) PO TABS: ORAL_TABLET | ORAL | 3 refills | Status: DC

## 2017-06-19 MED ORDER — VITAMIN D (ERGOCALCIFEROL) 1.25 MG (50000 UNIT) PO CAPS: 50000.0000 [IU] | ORAL_CAPSULE | ORAL | 1 refills | Status: DC

## 2017-06-19 NOTE — Patient Instructions (Signed)
Diabetes Mellitus and Standards of Medical Care  Managing diabetes (diabetes mellitus) can be complicated. Your diabetes treatment may be managed by a team of health care providers, including:  A diet and nutrition specialist (registered dietitian).  A nurse.  A certified diabetes educator (CDE).  A diabetes specialist (endocrinologist).  An eye doctor.  A primary care provider.  A dentist.  Your health care providers follow a schedule in order to help you get the best quality of care. The following schedule is a general guideline for your diabetes management plan. Your health care providers may also give you more specific instructions.  HbA1c (hemoglobin A1c) test This test provides information about blood sugar (glucose) control over the previous 2-3 months. It is used to check whether your diabetes management plan needs to be adjusted.  If you are meeting your treatment goals, this test is done at least 2 times a year.  If you are not meeting treatment goals or if your treatment goals have changed, this test is done 4 times a year.  Blood pressure test  This test is done at every routine medical visit. For most people, the goal is less than 130/80. Ask your health care provider what your goal blood pressure should be.  Dental and eye exams  Visit your dentist two times a year.  If you have type 1 diabetes, get an eye exam 3-5 years after you are diagnosed, and then once a year after your first exam. ? If you were diagnosed with type 1 diabetes as a child, get an eye exam when you are age 46 or older and have had diabetes for 3-5 years. After the first exam, you should get an eye exam once a year.  If you have type 2 diabetes, have an eye exam as soon as you are diagnosed, and then once a year after your first exam.  Foot care exam  Visual foot exams are done at every routine medical visit. The exams check for cuts, bruises, redness, blisters, sores, or other problems with  the feet.  A complete foot exam is done by your health care provider once a year. This exam includes an inspection of the structure and skin of your feet, and a check of the pulses and sensation in your feet. ? Type 1 diabetes: Get your first exam 3-5 years after diagnosis. ? Type 2 diabetes: Get your first exam as soon as you are diagnosed.  Check your feet every day for cuts, bruises, redness, blisters, or sores. If you have any of these or other problems that are not healing, contact your health care provider.  Kidney function test (urine microalbumin)  This test is done once a year. ? Type 1 diabetes: Get your first test 5 years after diagnosis. ? Type 2 diabetes: Get your first test as soon as you are diagnosed._  If you have chronic kidney disease (CKD), get a serum creatinine and estimated glomerular filtration rate (eGFR) test once a year.  Lipid profile (cholesterol, HDL, LDL, triglycerides)  This test should be done when you are diagnosed with diabetes, and every 5 years after the first test. If you are on medicines to lower your cholesterol, you may need to get this test done every year. ? The goal for LDL is less than 100 mg/dL (5.5 mmol/L). If you are at high risk, the goal is less than 70 mg/dL (3.9 mmol/L). ? The goal for HDL is 40 mg/dL (2.2 mmol/L) for men and 50 mg/dL(2.8 mmol/L)  for women. An HDL cholesterol of 60 mg/dL (3.3 mmol/L) or higher gives some protection against heart disease. ? The goal for triglycerides is less than 150 mg/dL (8.3 mmol/L).  Immunizations  The yearly flu (influenza) vaccine is recommended for everyone 6 months or older who has diabetes.  The pneumonia (pneumococcal) vaccine is recommended for everyone 2 years or older who has diabetes. If you are 46 or older, you may get the pneumonia vaccine as a series of two separate shots.  The hepatitis B vaccine is recommended for adults shortly after they have been diagnosed with diabetes.  The Tdap  (tetanus, diphtheria, and pertussis) vaccine should be given: ? According to normal childhood vaccination schedules, for children. ? Every 10 years, for adults who have diabetes.  The shingles vaccine is recommended for people who have had chicken pox and are 46 years or older.  Mental and emotional health  Screening for symptoms of eating disorders, anxiety, and depression is recommended at the time of diagnosis and afterward as needed. If your screening shows that you have symptoms (you have a positive screening result), you may need further evaluation and be referred to a mental health care provider.  Diabetes self-management education  Education about how to manage your diabetes is recommended at diagnosis and ongoing as needed.  Treatment plan  Your treatment plan will be reviewed at every medical visit.  Summary  Managing diabetes (diabetes mellitus) can be complicated. Your diabetes treatment may be managed by a team of health care providers.  Your health care providers follow a schedule in order to help you get the best quality of care.  Standards of care including having regular physical exams, blood tests, blood pressure monitoring, immunizations, screening tests, and education about how to manage your diabetes.  Your health care providers may also give you more specific instructions based on your individual health.      Type 2 Diabetes Mellitus, Self Care, Adult Caring for yourself after you have been diagnosed with type 2 diabetes (type 2 diabetes mellitus) means keeping your blood sugar (glucose) under control with a balance of:  Nutrition.  Exercise.  Lifestyle changes.  Medicines or insulin, if necessary.  Support from your team of health care providers and others.  The following information explains what you need to know to manage your diabetes at home. What do I need to do to manage my blood glucose?  Check your blood glucose every day, as often as  told by your health care provider.  Contact your health care provider if your blood glucose is above your target for 2 tests in a row.  Have your A1c (hemoglobin A1c) level checked at least two times a year, or as often as told by your health care provider. Your health care provider will set individualized treatment goals for you. Generally, the goal of treatment is to maintain the following blood glucose levels:  Before meals (preprandial): 80-130 mg/dL (4.4-7.2 mmol/L).  After meals (postprandial): below 180 mg/dL (10 mmol/L).  A1c level: less than 7%.  What do I need to know about hyperglycemia and hypoglycemia? What is hyperglycemia? Hyperglycemia, also called high blood glucose, occurs when blood glucose is too high.Make sure you know the early signs of hyperglycemia, such as:  Increased thirst.  Hunger.  Feeling very tired.  Needing to urinate more often than usual.  Blurry vision.  What is hypoglycemia? Hypoglycemia, also called low blood glucose, occurswith a blood glucose level at or below 70 mg/dL (3.9 mmol/L).  The risk for hypoglycemia increases during or after exercise, during sleep, during illness, and when skipping meals or not eating for a long time (fasting). It is important to know the symptoms of hypoglycemia and treat it right away. Always have a 15-gram rapid-acting carbohydrate snack with you to treat low blood glucose. Family members and close friends should also know the symptoms and should understand how to treat hypoglycemia, in case you are not able to treat yourself. What are the symptoms of hypoglycemia? Hypoglycemia symptoms can include:  Hunger.  Anxiety.  Sweating and feeling clammy.  Confusion.  Dizziness or feeling light-headed.  Sleepiness.  Nausea.  Increased heart rate.  Headache.  Blurry vision.  Seizure.  Nightmares.  Tingling or numbness around the mouth, lips, or tongue.  A change in speech.  Decreased ability to  concentrate.  A change in coordination.  Restless sleep.  Tremors or shakes.  Fainting.  Irritability.  How do I treat hypoglycemia?  If you are alert and able to swallow safely, follow the 15:15 rule:  Take 15 grams of a rapid-acting carbohydrate. Rapid-acting options include: ? 1 tube of glucose gel. ? 3 glucose pills. ? 6-8 pieces of hard candy. ? 4 oz (120 mL) of fruit juice. ? 4 oz (120 mL) of regular (not diet) soda.  Check your blood glucose 15 minutes after you take the carbohydrate.  If the repeat blood glucose level is still at or below 70 mg/dL (3.9 mmol/L), take 15 grams of a carbohydrate again.  If your blood glucose level does not increase above 70 mg/dL (3.9 mmol/L) after 3 tries, seek emergency medical care.  After your blood glucose level returns to normal, eat a meal or a snack within 1 hour.  How do I treat severe hypoglycemia? Severe hypoglycemia is when your blood glucose level is at or below 54 mg/dL (3 mmol/L). Severe hypoglycemia is an emergency. Do not wait to see if the symptoms will go away. Get medical help right away. Call your local emergency services (911 in the [place] Do not drive yourself to the hospital. If you have severe hypoglycemia and you cannot eat or drink, you may need an injection of glucagon. A family member or close friend should learn how to check your blood glucose and how to give you a glucagon injection. Ask your health care provider if you need to have an emergency glucagon injection kit available. Severe hypoglycemia may need to be treated in a hospital. The treatment may include getting glucose through an IV tube. You may also need treatment for the cause of your hypoglycemia. Can having diabetes put me at risk for other conditions? Having diabetes can put you at risk for other long-term (chronic) conditions, such as heart disease and kidney disease. Your health care provider may prescribe medicines to help prevent complications  from diabetes. These medicines may include:  Aspirin.  Medicine to lower cholesterol.  Medicine to control blood pressure.  What else can I do to manage my diabetes? Take your diabetes medicines as told  If your health care provider prescribed insulin or diabetes medicines, take them every day.  Do not run out of insulin or other diabetes medicines that you take. Plan ahead so you always have these available.  If you use insulin, adjust your dosage based on how physically active you are and what foods you eat. Your health care provider will tell you how to adjust your dosage. Make healthy food choices  The things that you eat   and drink affect your blood glucose and your insulin dosage. Making good choices helps to control your diabetes and prevent other health problems. A healthy meal plan includes eating lean proteins, complex carbohydrates, fresh fruits and vegetables, low-fat dairy products, and healthy fats. Make an appointment to see a diet and nutrition specialist (registered dietitian) to help you create an eating plan that is right for you. Make sure that you:  Follow instructions from your health care provider about eating or drinking restrictions.  Drink enough fluid to keep your urine clear or pale yellow.  Eat healthy snacks between nutritious meals.  Track the carbohydrates that you eat. Do this by reading food labels and learning the standard serving sizes of foods.  Follow your sick day plan whenever you cannot eat or drink as usual. Make this plan in advance with your health care provider.  Stay active  Exercise regularly, as told by your health care provider. This may include:  Stretching and doing strength exercises, such as yoga or weightlifting, at least 2 times a week.  Doing at least 150 minutes of moderate-intensity or vigorous-intensity exercise each week. This could be brisk walking, biking, or water aerobics. ? Spread out your activity over at least 3  days of the week. ? Do not go more than 2 days in a row without doing some kind of physical activity.  When you start a new exercise or activity, work with your health care provider to adjust your insulin, medicines, or food intake as needed. Make healthy lifestyle choices  Do not use any tobacco products, such as cigarettes, chewing tobacco, and e-cigarettes. If you need help quitting, ask your health care provider.  If your health care provider says that alcohol is safe for you, limit alcohol intake to no more than 1 drink per day for nonpregnant women and 2 drinks per day for men. One drink equals 12 oz of beer, 5 oz of wine, or 1 oz of hard liquor.  Learn to manage stress. If you need help with this, ask your health care provider. Care for your body   Keep your immunizations up to date. In addition to getting vaccinations as told by your health care provider, it is recommended that you get vaccinated against the following illnesses: ? The flu (influenza). Get a flu shot every year. ? Pneumonia. ? Hepatitis B.  Schedule an eye exam soon after your diagnosis, and then one time every year after that.  Check your skin and feet every day for cuts, bruises, redness, blisters, or sores. Schedule a foot exam with your health care provider once every year.  Brush your teeth and gums two times a day, and floss at least one time a day. Visit your dentist at least once every 6 months.  Maintain a healthy weight. General instructions  Take over-the-counter and prescription medicines only as told by your health care provider.  Share your diabetes management plan with people in your workplace, school, and household.  Check your urine for ketones when you are ill and as told by your health care provider.  Ask your health care provider: ? Do I need to meet with a diabetes educator? ? Where can I find a support group for people with diabetes?  Carry a medical alert card or wear medical alert  jewelry.  Keep all follow-up visits as told by your health care provider. This is important. Where to find more information: For more information about diabetes, visit:  American Diabetes Association (ADA):  www.diabetes.org  American Association of Diabetes Educators (AADE): www.diabeteseducator.org/patient-resources  This information is not intended to replace advice given to you by your health care provider. Make sure you discuss any questions you have with your health care provider. Document Released: 12/28/2015 Document Revised: 02/11/2016 Document Reviewed: 10/09/2015 Elsevier Interactive Patient Education  2017 [employer].      Blood Glucose Monitoring, Adult Monitoring your blood sugar (glucose) helps you manage your diabetes. It also helps you and your health care provider determine how well your diabetes management plan is working. Blood glucose monitoring involves checking your blood glucose as often as directed, and keeping a record (log) of your results over time. Why should I monitor my blood glucose? Checking your blood glucose regularly can:  Help you understand how food, exercise, illnesses, and medicines affect your blood glucose.  Let you know what your blood glucose is at any time. You can quickly tell if you are having low blood glucose (hypoglycemia) or high blood glucose (hyperglycemia).  Help you and your health care provider adjust your medicines as needed.  When should I check my blood glucose? Follow instructions from your health care provider about how often to check your blood glucose.   This may depend on:  The type of diabetes you have.  How well-controlled your diabetes is.  Medicines you are taking.  If you have type 1 diabetes:  Check your blood glucose at least 2 times a day.  Also check your blood glucose: ? Before every insulin injection. ? Before and after exercise. ? Between meals. ? 2 hours after a meal. ? Occasionally between  2:00 a.m. and 3:00 a.m., as directed. ? Before potentially dangerous tasks, like driving or using heavy machinery. ? At bedtime.  You may need to check your blood glucose more often, up to 6-10 times a day: ? If you use an insulin pump. ? If you need multiple daily injections (MDI). ? If your diabetes is not well-controlled. ? If you are ill. ? If you have a history of severe hypoglycemia. ? If you have a history of not knowing when your blood glucose is getting low (hypoglycemia unawareness).  If you have type 2 diabetes:  If you take insulin or other diabetes medicines, check your blood glucose at least 2 times a day.  If you are on intensive insulin therapy, check your blood glucose at least 4 times a day. Occasionally, you may also need to check between 2:00 a.m. and 3:00 a.m., as directed.  Also check your blood glucose: ? Before and after exercise. ? Before potentially dangerous tasks, like driving or using heavy machinery.  You may need to check your blood glucose more often if: ? Your medicine is being adjusted. ? Your diabetes is not well-controlled. ? You are ill.  What is a blood glucose log?  A blood glucose log is a record of your blood glucose readings. It helps you and your health care provider: ? Look for patterns in your blood glucose over time. ? Adjust your diabetes management plan as needed.  Every time you check your blood glucose, write down your result and notes about things that may be affecting your blood glucose, such as your diet and exercise for the day.  Most glucose meters store a record of glucose readings in the meter. Some meters allow you to download your records to a computer. How do I check my blood glucose? Follow these steps to get accurate readings of your blood glucose: Supplies  needed   Blood glucose meter.  Test strips for your meter. Each meter has its own strips. You must use the strips that come with your meter.  A needle to  prick your finger (lancet). Do not use lancets more than once.  A device that holds the lancet (lancing device).  A journal or log book to write down your results.  Procedure  Wash your hands with soap and water.  Prick the side of your finger (not the tip) with the lancet. Use a different finger each time.  Gently rub the finger until a small drop of blood appears.  Follow instructions that come with your meter for inserting the test strip, applying blood to the strip, and using your blood glucose meter.  Write down your result and any notes.  Alternative testing sites  Some meters allow you to use areas of your body other than your finger (alternative sites) to test your blood.  If you think you may have hypoglycemia, or if you have hypoglycemia unawareness, do not use alternative sites. Use your finger instead.  Alternative sites may not be as accurate as the fingers, because blood flow is slower in these areas. This means that the result you get may be delayed, and it may be different from the result that you would get from your finger.  The most common alternative sites are: ? Forearm. ? Thigh. ? Palm of the hand.  Additional tips  Always keep your supplies with you.  If you have questions or need help, all blood glucose meters have a 24-hour "hotline" number that you can call. You may also contact your health care provider.  After you use a few boxes of test strips, adjust (calibrate) your blood glucose meter by following instructions that came with your meter.  The American Diabetes Association suggests the following targets for most nonpregnant adults with diabetes.  More or less stringent glycemic goals may be appropriate for each individual.  A1C: Less than 7% A1C may also be reported as eAG: Less than 154 mg/dl Before a meal (preprandial plasma glucose): 80-130 mg/dl 1-2 hours after beginning of the meal (Postprandial plasma glucose)*: Less than 180  mg/dl  *Postprandial glucose may be targeted if A1C goals are not met despite reaching preprandial glucose goals.   This information is not intended to replace advice given to you by your health care provider. Make sure you discuss any questions you have with your health care provider. Document Released: 09/08/2003 Document Revised: 03/25/2016 Document Reviewed: 02/15/2016 Elsevier Interactive Patient Education  2017 [employer].

## 2017-06-19 NOTE — Progress Notes (Signed)
Assessment and plan:  1. Diabetes mellitus due to underlying condition with hyperosmolarity without coma, without long-term current use of insulin ([place])   2. Hyperlipidemia associated with type 2 diabetes mellitus ([place])   3. Vitamin D deficiency   4. Elevated liver enzymes   5. Hypertension associated with diabetes ([place])     Diabetes mellitus due to underlying condition with hyperosmolarity without coma, without long-term current use of insulin ([place])  - Plan: HM Diabetes Foot Exam -Well-controlled A1c continue medications.  Hyperlipidemia associated with type 2 diabetes mellitus ([place]) -LDL not at goal patient will work on diet and exercise.  Understands if not at goal in 6 months on recheck, will need to increase her statin.  Vitamin D deficiency  - Plan: Vitamin D, Ergocalciferol, (DRISDOL) 50000 units CAPS capsule, Cholecalciferol (VITAMIN D3) 5000 units TABS  Elevated liver enzymes -Stable, will continue to monitor.  Hypertension associated with diabetes ([place]) -Blood pressure well controlled.  Continue current treatment plan   Meds ordered this encounter  Medications  . Vitamin D, Ergocalciferol, (DRISDOL) 50000 units CAPS capsule    Sig: Take 1 capsule (50,000 Units total) by mouth every 7 (seven) days.    Dispense:  12 capsule    Refill:  1  . Cholecalciferol (VITAMIN D3) 5000 units TABS    Sig: 5,000 IU OTC vitamin D3 daily.    Dispense:  90 tablet    Refill:  3    Orders Placed This Encounter  Procedures  . HM Diabetes Foot Exam    Return in about 6 months (around 12/18/2017) for pt will follow w/ Endo in 7mo, [place] in 42mo for DM, BP etc.   Anticipatory guidance and routine counseling done re: condition, txmnt options and need for follow up. All questions of patient's were answered.   Gross side effects, risk and benefits, and alternatives of medications discussed with patient.  Patient is aware  that all medications have potential side effects and we are unable to predict every sideeffect or drug-drug interaction that may occur.  Expresses verbal understanding and consents to current therapy plan and treatment regiment.  Please see AVS handed out to patient at the end of our visit for additional patient instructions/ counseling done pertaining to today's office visit.  Note: This document was prepared using Dragon voice recognition software and may include unintentional dictation errors.   ----------------------------------------------------------------------------------------------------------------------  Subjective:   CC:   Dawnyel Leven is a 46 y.o. female who presents to [place] at [facility] today for review and discussion of recent bloodwork that was done.  1. All recent blood work that we ordered was reviewed with patient today.  Patient was counseled on all abnormalities and we discussed dietary and lifestyle changes that could help those values (also medications when appropriate).  Extensive health counseling performed and all patient's concerns/ questions were addressed.  2. Notes reviewed from 06/16/2017.   3. Vitamin D still low at 28.6.  1 year ago was 36.  Not taking supp as she should 4. Cholesterol: LDL improved from prior.  Still not at goal with a history of diabetes.  Patient admits to having poor diet and lifestyle habits but is compliant with medications w/o S-E. 5. Elevated LFT's- stable from prior. 6. A1c- down to 6.6- excellent!  Sees endocrinology for management   Wt Readings from Last 3 Encounters:  06/19/17 177 lb 9.6 oz (80.6 kg)  04/19/17 176 lb 4.8 oz (80 kg)  03/06/17 178 lb 6.4 oz (80.9 kg)   BP Readings from Last 3 Encounters:  06/19/17 110/75  04/19/17 114/77  03/06/17 109/77   Pulse Readings from Last 3 Encounters:  06/19/17 83  04/19/17 68  03/06/17 79   BMI Readings from Last 3 Encounters:  06/19/17 28.24 kg/m    04/19/17 28.89 kg/m  03/06/17 29.24 kg/m     Patient Care Team    Relationship Specialty Notifications Start End  Mellody Dance, DO PCP - General Family Medicine  02/18/16   [place], Amy, MD Consulting Physician Dermatology  05/25/16    Comment: Dalphine Handing, MD Referring Physician Psychiatry  08/31/16    Comment: neuropsychiatry  Chari Manning, MD Attending Physician Orthopedic Surgery  09/22/16   Levin Erp, [place] Physician Assistant Gastroenterology  09/22/16   Jacelyn Pi, MD Consulting Physician Endocrinology  09/22/16   Marylynn Pearson, MD Consulting Physician Obstetrics and Gynecology  09/22/16   Macarthur Critchley, [place] Referring Physician Optometry  09/22/16     Full medical history updated and reviewed in the office today  Patient Active Problem List   Diagnosis Date Noted  . Hyperlipidemia associated with type 2 diabetes mellitus ([place]) 10/29/2012    Priority: High  . Hypertension associated with diabetes ([place]) 10/29/2012    Priority: High  . Diabetes ([place])     Priority: High  . Vitamin D deficiency 06/19/2017  . Adjustment disorder 03/06/2017  . Diabetic cataract ([place]) 08/31/2016  . h/o Drug-induced acute pancreatitis (2nd [place] or onglyza) 05/27/2016  . Elevated liver enzymes 05/27/2016  . Chronic Mild Hypercalcemia 05/27/2016  . Overweight (BMI 25.0-29.9) 02/21/2016  . Pain, abdominal, nonspecific 02/21/2016  . Skin lesion 02/21/2016  . Enthesopathy of hip 10/19/2014  . Cramp in muscle 08/13/2013    Past Medical History:  Diagnosis Date  . Acute pancreatitis   . Diabetes ([place])   . Gallstones   . Hyperlipidemia   . Hypertension   . Pneumonia   . Squamous cell carcinoma   . Thyroid disease     Past Surgical History:  Procedure Laterality Date  . CESAREAN SECTION  2008  . CHOLECYSTECTOMY    . HIP ARTHROSCOPY Right 2011, 2016  . KNEE ARTHROSCOPY Right 1988  . TONSILLECTOMY  1989    Social History   Tobacco Use  . Smoking  status: Former Smoker    Packs/day: 0.25    Years: 5.00    Pack years: 1.25    Last attempt to quit: 09/19/2005    Years since quitting: 11.9  . Smokeless tobacco: Never Used  . Tobacco comment: Quit 2007  Substance Use Topics  . Alcohol use: Yes    Comment: Drinks alcohol four times a month.    Family Hx: Family History  Problem Relation Age of Onset  . Heart disease Mother   . Basal cell carcinoma Mother   . Diabetes Father   . Hyperlipidemia Father   . Hypertension Father   . Colon polyps Father   . Prostate cancer Father   . Melanoma Father   . Aneurysm Father   . AAA (abdominal aortic aneurysm) Father 20       Thoracic aorta repair and AVR  . Alcohol abuse Sister   . Mental illness Sister   . Heart attack Paternal Aunt   . Melanoma Paternal Aunt   . Breast cancer Maternal Grandmother   . AAA (abdominal aortic aneurysm) Maternal Grandfather   . Diabetes Paternal Grandmother   .  Stroke Paternal Grandfather   . Melanoma Paternal Grandfather      Medications: Current Outpatient Medications  Medication Sig Dispense Refill  . canagliflozin (INVOKANA) 300 MG TABS tablet Take 300 mg by mouth daily before breakfast.    . FLUoxetine (PROZAC) 20 MG tablet Take 1 tablet (20 mg total) by mouth daily. 90 tablet 1  . glucose blood test strip Check 2 hour postprandial and fasting blood sugars daily; also if you feel poorly 100 each 12  . Ibuprofen 200 MG CAPS Take 600 mg by mouth as needed.     Marland Kitchen levonorgestrel (MIRENA) 20 MCG/24HR IUD Placed January, 2009     . levothyroxine (SYNTHROID, LEVOTHROID) 100 MCG tablet Take 100 mcg by mouth daily.  4  . lisinopril (PRINIVIL,ZESTRIL) 20 MG tablet Take 20 mg by mouth daily.  6  . metFORMIN (GLUCOPHAGE-XR) 500 MG 24 hr tablet Take 2 tablets by mouth 2 (two) times daily.    . simvastatin (ZOCOR) 10 MG tablet Take 1 tablet by mouth daily.    . Cholecalciferol (VITAMIN D3) 5000 units TABS 5,000 IU OTC vitamin D3 daily. 90 tablet 3  .  Vitamin D, Ergocalciferol, (DRISDOL) 50000 units CAPS capsule Take 1 capsule (50,000 Units total) by mouth every 7 (seven) days. 12 capsule 1   No current facility-administered medications for this visit.     Allergies:  Allergies  Allergen Reactions  . Januvia [Sitagliptin] Other (See Comments)    Pancreatitis  . Onglyza [Saxagliptin] Other (See Comments)    Pancreatitis to all DPP 4 inhibitor medications  . Cherry Swelling and Itching  . Lidocaine     Other reaction(s): Other (See Comments) Blisters     Review of Systems: General:   No F/C, wt loss Pulm:   No DIB, SOB, pleuritic chest pain Card:  No CP, palpitations Abd:  No n/v/d or pain Ext:  No inc edema from baseline  Objective:  Blood pressure 110/75, pulse 83, height 5' 6.5" (1.689 m), weight 177 lb 9.6 oz (80.6 kg). Body mass index is 28.24 kg/m. Gen:   Well NAD, A and O *3 HEENT:    [place]/AT, EOMI,  MMM Lungs:   Normal work of breathing. CTA B/L, no Wh, rhonchi Heart:   RRR, S1, S2 WNL's, no MRG Abd:   No gross distention Exts:    warm, pink,  Brisk capillary refill, warm and well perfused.  Psych:    No HI/SI, judgement and insight good, Euthymic mood. Full Affect.

## 2017-07-21 DIAGNOSIS — Z23 Encounter for immunization: Secondary | ICD-10-CM | POA: Diagnosis not present

## 2017-08-21 DIAGNOSIS — E039 Hypothyroidism, unspecified: Secondary | ICD-10-CM | POA: Diagnosis not present

## 2017-08-21 DIAGNOSIS — E119 Type 2 diabetes mellitus without complications: Secondary | ICD-10-CM | POA: Diagnosis not present

## 2017-08-21 DIAGNOSIS — E7800 Pure hypercholesterolemia, unspecified: Secondary | ICD-10-CM | POA: Diagnosis not present

## 2017-09-15 ENCOUNTER — Encounter: Payer: Self-pay | Admitting: Family Medicine

## 2017-10-05 DIAGNOSIS — E119 Type 2 diabetes mellitus without complications: Secondary | ICD-10-CM | POA: Diagnosis not present

## 2017-10-05 DIAGNOSIS — I10 Essential (primary) hypertension: Secondary | ICD-10-CM | POA: Diagnosis not present

## 2017-10-05 DIAGNOSIS — E7800 Pure hypercholesterolemia, unspecified: Secondary | ICD-10-CM | POA: Diagnosis not present

## 2017-10-26 ENCOUNTER — Other Ambulatory Visit: Payer: Self-pay | Admitting: Family Medicine

## 2017-10-26 DIAGNOSIS — F4320 Adjustment disorder, unspecified: Secondary | ICD-10-CM

## 2017-12-04 ENCOUNTER — Encounter: Payer: Self-pay | Admitting: Family Medicine

## 2017-12-04 ENCOUNTER — Ambulatory Visit: Payer: 59 | Admitting: Family Medicine

## 2017-12-04 VITALS — BP 113/76 | HR 83 | Temp 98.4°F | Ht 65.5 in | Wt 177.9 lb

## 2017-12-04 DIAGNOSIS — J329 Chronic sinusitis, unspecified: Secondary | ICD-10-CM

## 2017-12-04 DIAGNOSIS — R062 Wheezing: Secondary | ICD-10-CM

## 2017-12-04 DIAGNOSIS — E0800 Diabetes mellitus due to underlying condition with hyperosmolarity without nonketotic hyperglycemic-hyperosmolar coma (NKHHC): Secondary | ICD-10-CM | POA: Diagnosis not present

## 2017-12-04 DIAGNOSIS — R0602 Shortness of breath: Secondary | ICD-10-CM | POA: Diagnosis not present

## 2017-12-04 DIAGNOSIS — J40 Bronchitis, not specified as acute or chronic: Secondary | ICD-10-CM

## 2017-12-04 MED ORDER — AMOXICILLIN-POT CLAVULANATE 875-125 MG PO TABS: 1.0000 | ORAL_TABLET | Freq: Two times a day (BID) | ORAL | 0 refills | Status: AC

## 2017-12-04 MED ORDER — METHYLPREDNISOLONE ACETATE 40 MG/ML IJ SUSP
40.0000 mg | Freq: Once | INTRAMUSCULAR | Status: AC
  Administered 2017-12-04: 40 mg via INTRAMUSCULAR

## 2017-12-04 MED ORDER — ALBUTEROL SULFATE HFA 108 (90 BASE) MCG/ACT IN AERS: 2.0000 | INHALATION_SPRAY | Freq: Four times a day (QID) | RESPIRATORY_TRACT | 0 refills | Status: DC | PRN

## 2017-12-04 MED ORDER — HYDROCOD POLST-CPM POLST ER 10-8 MG/5ML PO SUER: 5.0000 mL | Freq: Two times a day (BID) | ORAL | 0 refills | Status: DC | PRN

## 2017-12-04 MED ORDER — PREDNISONE 20 MG PO TABS: ORAL_TABLET | ORAL | 0 refills | Status: DC

## 2017-12-04 NOTE — Progress Notes (Signed)
Acute Care Office visit  Assessment and plan:  1. Bronchitis   2. Diabetes mellitus due to underlying condition with hyperosmolarity without coma, without long-term current use of insulin ([place])   3. Wheeze   4. SOB (shortness of breath)   5. Rhinosinusitis     - Viral vs Allergic vs Bacterial causes for pt's symptoms reveiwed.    - Supportive care and various OTC medications discussed in addition to any prescribed. - Call or RTC if new symptoms, or if no improvement or worse over next several days.   - Will consider ABX if sx continue past 10 days and worsening if not already given.   - Patient is tight globally and congested. - Augmentin prescribed to cover bronchitis. - Albuterol inhaler prescribed for relief, along with cough medicine at night.  - Supportive care recommended. - Drink extra fluids, rest, take time off if needed.  - Oral steroids prescribed because she is getting worse. - Steroid injection recommended to get the steroids in early as possible. - Given her diabetes, cut back on sweets and carbs, especially while on the steroids.    Meds ordered this encounter  Medications  . methylPREDNISolone acetate (DEPO-MEDROL) injection 40 mg  . amoxicillin-clavulanate (AUGMENTIN) 875-125 MG tablet    Sig: Take 1 tablet by mouth 2 (two) times daily for 10 days.    Dispense:  20 tablet    Refill:  0  . albuterol (PROVENTIL HFA;VENTOLIN HFA) 108 (90 Base) MCG/ACT inhaler    Sig: Inhale 2 puffs into the lungs every 6 (six) hours as needed for wheezing or shortness of breath.    Dispense:  1 Inhaler    Refill:  0  . predniSONE (DELTASONE) 20 MG tablet    Sig: Take 3 pills a day for 2 days, 2 pills a day for 2 days, 1 pill a day for 2 days then one half pill a day for 2 days then off    Dispense:  14 tablet    Refill:  0  . chlorpheniramine-HYDROcodone (TUSSIONEX) 10-8 MG/5ML SUER    Sig: Take 5 mLs by mouth every 12 (twelve) hours as needed for cough (cough, will  cause drowsiness.).    Dispense:  200 mL    Refill:  0    No orders of the defined types were placed in this encounter.   Gross side effects, risk and benefits, and alternatives of medications discussed with patient.  Patient is aware that all medications have potential side effects and we are unable to predict every sideeffect or drug-drug interaction that may occur.  Expresses verbal understanding and consents to current therapy plan and treatment regiment.   Education and routine counseling performed. Handouts provided.  Anticipatory guidance and routine counseling done re: condition, txmnt options and need for follow up. All questions of patient's were answered.  Return if symptoms worsen or fail to improve.  Please see AVS handed out to patient at the end of our visit for additional patient instructions/ counseling done pertaining to today's office visit.  Note: This document was partially repared using Dragon voice recognition software and may include unintentional dictation errors.  This document serves as a record of services personally performed by Mellody Dance, DO. It was created on her behalf by Toni Amend, a trained medical scribe. The creation of this record is based on the scribe's personal observations and the provider's statements to them.   I have reviewed the above medical documentation for accuracy and completeness  and I concur.  Mellody Dance 12/11/17 5:18 PM    Subjective:    Chief Complaint  Patient presents with  . Cough   Was recently in the [place] for 6 days.  HPI:  Pt presents with new Sx for 3-4 days.  Notes that she has had a cold for a month, but now has a new cough.   C/o:  Recent cough.  Started Thursday / Friday.  Wheezing started yesterday.  Denies:  No fevers, hasn't been running fevers.    For symptoms patient has tried:  "Mass amounts of" Mucinex only.  Overall getting:   Feels worse overall.  Was not recently exposed to  illness.  Has had the cold that's been "coming and going, coming and going."   Patient Care Team    Relationship Specialty Notifications Start End  Mellody Dance, DO PCP - General Family Medicine  02/18/16   [place], Amy, MD Consulting Physician Dermatology  05/25/16    Comment: Dalphine Handing, MD Referring Physician Psychiatry  08/31/16    Comment: neuropsychiatry  Chari Manning, MD Attending Physician Orthopedic Surgery  09/22/16   Levin Erp, PA Physician Assistant Gastroenterology  09/22/16   Jacelyn Pi, MD Consulting Physician Endocrinology  09/22/16   Marylynn Pearson, MD Consulting Physician Obstetrics and Gynecology  09/22/16   Macarthur Critchley, [place] Referring Physician Optometry  09/22/16     Past medical history, Surgical history, Family history reviewed and noted below, Social history, Allergies, and Medications have been entered into the medical record, reviewed and changed as needed.   Allergies  Allergen Reactions  . Januvia [Sitagliptin] Other (See Comments)    Pancreatitis  . Onglyza [Saxagliptin] Other (See Comments)    Pancreatitis to all DPP 4 inhibitor medications  . Cherry Swelling and Itching  . Lidocaine     Other reaction(s): Other (See Comments) Blisters    Review of Systems: - see above HPI for pertinent positives General:   No F/C, wt loss Pulm:   No DIB, pleuritic chest pain Card:  No CP, palpitations Abd:  No n/v/d or pain Ext:  No inc edema from baseline   Objective:   Blood pressure 113/76, pulse 83, temperature 98.4 F (36.9 C), height 5' 5.5" (1.664 m), weight 177 lb 14.4 oz (80.7 kg), SpO2 97 %. Body mass index is 29.15 kg/m. General: Well Developed, well nourished, appropriate for stated age.  Neuro: Alert and oriented x3, extra-ocular muscles intact, sensation grossly intact.  HEENT: Normocephalic, atraumatic, pupils equal round reactive to light, neck supple, no masses, no painful lymphadenopathy, TM's intact B/L,  no acute findings. Nares- patent, clear d/c, OP- clear, mild erythema, No TTP sinuses Skin: Warm and dry, no gross rash. Cardiac: RRR, S1 S2,  no murmurs rubs or gallops.  Respiratory: Diffuse rhonchi and expiratory wheezes throughout, B/L and A/P, Not using accessory muscles, speaking in full sentences- unlabored. Vascular:  No gross lower ext edema, cap RF less 2 sec. Psych: No HI/SI, judgement and insight good, Euthymic mood. Full Affect.

## 2017-12-04 NOTE — Progress Notes (Signed)
Opened in error

## 2017-12-04 NOTE — Patient Instructions (Signed)
Bronchospasm, Adult Bronchospasm is a tightening of the airways going into the lungs. During an episode, it may be harder to breathe. You may cough, and you may make a whistling sound when you breathe (wheeze). This condition often affects people with asthma. What are the causes? This condition is caused by swelling and irritation in the airways. It can be triggered by:  An infection (common).  Seasonal allergies.  An allergic reaction.  Exercise.  Irritants. These include pollution, cigarette smoke, strong odors, aerosol sprays, and paint fumes.  Weather changes. Winds increase molds and pollens in the air. Cold air may cause swelling.  Stress and emotional upset.  What are the signs or symptoms? Symptoms of this condition include:  Wheezing. If the episode was triggered by an allergy, wheezing may start right away or hours later.  Nighttime coughing.  Frequent or severe coughing with a simple cold.  Chest tightness.  Shortness of breath.  Decreased ability to exercise.  How is this diagnosed? This condition is usually diagnosed with a review of your medical history and a physical exam. Tests, such as lung function tests, are sometimes done to look for other conditions. The need for a chest X-ray depends on where the wheezing occurs and whether it is the first time you have wheezed. How is this treated? This condition may be treated with:  Inhaled medicines. These open up the airways and help you breathe. They can be taken with an inhaler or a nebulizer device.  Corticosteroid medicines. These may be given for severe bronchospasm, usually when it is associated with asthma.  Avoiding triggers, such as irritants, infection, or allergies.  Follow these instructions at home: Medicines  Take over-the-counter and prescription medicines only as told by your health care provider.  If you need to use an inhaler or nebulizer to take your medicine, ask your health care  provider to explain how to use it correctly. If you were given a spacer, always use it with your inhaler. Lifestyle  Reduce the number of triggers in your home. To do this: ? Change your heating and air conditioning filter at least once a month. ? Limit your use of fireplaces and wood stoves. ? Do not smoke. Do not allow smoking in your home. ? Avoid using perfumes and fragrances. ? Get rid of pests, such as roaches and mice, and their droppings. ? Remove any mold from your home. ? Keep your house clean and dust free. Use unscented cleaning products. ? Replace carpet with wood, tile, or vinyl flooring. Carpet can trap dander and dust. ? Use allergy-proof pillows, mattress covers, and box spring covers. ? Wash bed sheets and blankets every week in hot water. Dry them in a dryer. ? Use blankets that are made of polyester or cotton. ? Wash your hands often. ? Do not allow pets in your bedroom.  Avoid breathing in cold air when you exercise. General instructions  Have a plan for seeking medical care. Know when to call your health care provider and local emergency services, and where to get emergency care.  Stay up to date on your immunizations.  When you have an episode of bronchospasm, stay calm. Try to relax and breathe more slowly.  If you have asthma, make sure you have an asthma action plan.  Keep all follow-up visits as told by your health care provider. This is important. Contact a health care provider if:  You have muscle aches.  You have chest pain.  The mucus that  you cough up (sputum) changes from clear or white to yellow, green, gray, or bloody.  You have a fever.  Your sputum gets thicker. Get help right away if:  Your wheezing and coughing get worse, even after you take your prescribed medicines.  It gets even harder to breathe.  You develop severe chest pain. Summary  Bronchospasm is a tightening of the airways going into the lungs.  During an episode of  bronchospasm, you may have a harder time breathing. You may cough and make a whistling sound when you breathe (wheeze).  Avoid exposure to triggers such as smoke, dust, mold, animal dander, and fragrances.  When you have an episode of bronchospasm, stay calm. Try to relax and breathe more slowly. This information is not intended to replace advice given to you by your health care provider. Make sure you discuss any questions you have with your health care provider. Document Released: 09/08/2003 Document Revised: 09/01/2016 Document Reviewed: 09/01/2016 Elsevier Interactive Patient Education  2017 Elsevier Inc.    Acute Bronchitis, Adult Acute bronchitis is sudden (acute) swelling of the air tubes (bronchi) in the lungs. Acute bronchitis causes these tubes to fill with mucus, which can make it hard to breathe. It can also cause coughing or wheezing. In adults, acute bronchitis usually goes away within 2 weeks. A cough caused by bronchitis may last up to 3 weeks. Smoking, allergies, and asthma can make the condition worse. Repeated episodes of bronchitis may cause further lung problems, such as chronic obstructive pulmonary disease (COPD). What are the causes? This condition can be caused by germs and by substances that irritate the lungs, including:  Cold and flu viruses. This condition is most often caused by the same virus that causes a cold.  Bacteria.  Exposure to tobacco smoke, dust, fumes, and air pollution.  What increases the risk? This condition is more likely to develop in people who:  Have close contact with someone with acute bronchitis.  Are exposed to lung irritants, such as tobacco smoke, dust, fumes, and vapors.  Have a weak immune system.  Have a respiratory condition such as asthma.  What are the signs or symptoms? Symptoms of this condition include:  A cough.  Coughing up clear, yellow, or green mucus.  Wheezing.  Chest congestion.  Shortness of  breath.  A fever.  Body aches.  Chills.  A sore throat.  How is this diagnosed? This condition is usually diagnosed with a physical exam. During the exam, your health care provider may order tests, such as chest X-rays, to rule out other conditions. He or she may also:  Test a sample of your mucus for bacterial infection.  Check the level of oxygen in your blood. This is done to check for pneumonia.  Do a chest X-ray or lung function testing to rule out pneumonia and other conditions.  Perform blood tests.  Your health care provider will also ask about your symptoms and medical history. How is this treated? Most cases of acute bronchitis clear up over time without treatment. Your health care provider may recommend:  Drinking more fluids. Drinking more makes your mucus thinner, which may make it easier to breathe.  Taking a medicine for a fever or cough.  Taking an antibiotic medicine.  Using an inhaler to help improve shortness of breath and to control a cough.  Using a cool mist vaporizer or humidifier to make it easier to breathe.  Follow these instructions at home: Medicines  Take over-the-counter and prescription  medicines only as told by your health care provider.  If you were prescribed an antibiotic, take it as told by your health care provider. Do not stop taking the antibiotic even if you start to feel better. General instructions  Get plenty of rest.  Drink enough fluids to keep your urine clear or pale yellow.  Avoid smoking and secondhand smoke. Exposure to cigarette smoke or irritating chemicals will make bronchitis worse. If you smoke and you need help quitting, ask your health care provider. Quitting smoking will help your lungs heal faster.  Use an inhaler, cool mist vaporizer, or humidifier as told by your health care provider.  Keep all follow-up visits as told by your health care provider. This is important. How is this prevented? To lower your  risk of getting this condition again:  Wash your hands often with soap and water. If soap and water are not available, use hand sanitizer.  Avoid contact with people who have cold symptoms.  Try not to touch your hands to your mouth, nose, or eyes.  Make sure to get the flu shot every year.  Contact a health care provider if:  Your symptoms do not improve in 2 weeks of treatment. Get help right away if:  You cough up blood.  You have chest pain.  You have severe shortness of breath.  You become dehydrated.  You faint or keep feeling like you are going to faint.  You keep vomiting.  You have a severe headache.  Your fever or chills gets worse. This information is not intended to replace advice given to you by your health care provider. Make sure you discuss any questions you have with your health care provider. Document Released: 10/13/2004 Document Revised: 03/30/2016 Document Reviewed: 02/24/2016 Elsevier Interactive Patient Education  [employer].

## 2017-12-07 DIAGNOSIS — Z1231 Encounter for screening mammogram for malignant neoplasm of breast: Secondary | ICD-10-CM | POA: Diagnosis not present

## 2017-12-07 DIAGNOSIS — Z01419 Encounter for gynecological examination (general) (routine) without abnormal findings: Secondary | ICD-10-CM | POA: Diagnosis not present

## 2017-12-07 DIAGNOSIS — Z6827 Body mass index (BMI) 27.0-27.9, adult: Secondary | ICD-10-CM | POA: Diagnosis not present

## 2017-12-18 ENCOUNTER — Ambulatory Visit: Payer: 59 | Admitting: Family Medicine

## 2018-01-02 ENCOUNTER — Ambulatory Visit: Payer: 59 | Admitting: Family Medicine

## 2018-03-20 ENCOUNTER — Ambulatory Visit: Payer: 59 | Admitting: Family Medicine

## 2018-03-20 VITALS — BP 113/79 | HR 77 | Ht 65.5 in | Wt 182.0 lb

## 2018-03-20 DIAGNOSIS — H6591 Unspecified nonsuppurative otitis media, right ear: Secondary | ICD-10-CM | POA: Diagnosis not present

## 2018-03-20 DIAGNOSIS — H60501 Unspecified acute noninfective otitis externa, right ear: Secondary | ICD-10-CM | POA: Insufficient documentation

## 2018-03-20 MED ORDER — AMOXICILLIN 500 MG PO CAPS: 1000.0000 mg | ORAL_CAPSULE | Freq: Three times a day (TID) | ORAL | 0 refills | Status: DC

## 2018-03-20 MED ORDER — CIPROFLOXACIN-DEXAMETHASONE 0.3-0.1 % OT SUSP: 4.0000 [drp] | Freq: Two times a day (BID) | OTIC | 0 refills | Status: AC

## 2018-03-20 MED ORDER — FLUCONAZOLE 150 MG PO TABS
ORAL_TABLET | ORAL | 1 refills | Status: DC
Start: 2018-03-20 — End: 2018-08-06

## 2018-03-20 NOTE — Progress Notes (Signed)
Acute Care Office visit  Assessment and plan:  1. Right serous otitis media, unspecified chronicity   2. Acute otitis externa of right ear, unspecified type     - Viral vs Allergic vs Bacterial causes for pt's symptoms reveiwed.     - Supportive care and various OTC medications discussed in addition to any prescribed. - May use tylenol for pain if needed.  Advised patient to push fluids.  - Advised the patient to began using AYR or Neilmed sinus rinses BID followed by flonase BID (one spray to each nostril). Advised that the patient may also incorporate allegra or claritin PRN.   - ABX prescribed for patient's R-sided ear infection.  - Ciprofloxacin drops prescribed, along with high dose Amoxicillin, 1000 TID for ear infection. - Advised patient that antibiotics can sometimes upset the stomach and cause yeast infections.  - Diflucan provided in case the parent experiences a yeast infection.  Reviewed to take one tablet at onset of vaginal discharge, then repeat in one week.  - Call or RTC if new symptoms, or if no improvement or worse over next several days.      Meds ordered this encounter  Medications  . amoxicillin (AMOXIL) 500 MG capsule    Sig: Take 2 capsules (1,000 mg total) by mouth 3 (three) times daily.    Dispense:  60 capsule    Refill:  0  . fluconazole (DIFLUCAN) 150 MG tablet    Sig: Take 1 tablet at onset of vaginal discharge then repeat in 1 week.    Dispense:  2 tablet    Refill:  1  . ciprofloxacin-dexamethasone (CIPRODEX) OTIC suspension    Sig: Place 4 drops into the right ear 2 (two) times daily for 7 days.    Dispense:  7.5 mL    Refill:  0    No orders of the defined types were placed in this encounter.   Gross side effects, risk and benefits, and alternatives of medications discussed with patient.  Patient is aware that all medications have potential side effects and we are unable to predict every sideeffect or drug-drug interaction that may  occur.  Expresses verbal understanding and consents to current therapy plan and treatment regiment.   Education and routine counseling performed. Handouts provided.  Anticipatory guidance and routine counseling done re: condition, txmnt options and need for follow up. All questions of patient's were answered.  Return if symptoms worsen or fail to improve.  Please see AVS handed out to patient at the end of our visit for additional patient instructions/ counseling done pertaining to today's office visit.  Note: This document was partially repared using Dragon voice recognition software and may include unintentional dictation errors.  This document serves as a record of services personally performed by Mellody Dance, DO. It was created on her behalf by Toni Amend, a trained medical scribe. The creation of this record is based on the scribe's personal observations and the provider's statements to them.   I have reviewed the above medical documentation for accuracy and completeness and I concur.  Mellody Dance 03/21/18 12:55 PM    Subjective:    Chief Complaint  Patient presents with  . Ear Pain    HPI:  Pt presents with Sx for 3 weeks.  Right ear pain began a while ago, but started hurting again 3-4 days ago.   C/o:  States that it "began like swimmers ear, kind of."  It resolved for a few weeks, but then  recurred.  Notes that it didn't hurt and it felt like there was a nodule inside.    Notes that after her ear recently started hurting again, it's feeling very full today.  The pain has woken her up the last few nights; she can't lay on it.  Denies:  Fever.  When the pain first started, it hurt all the way down the right side of her face & neck.    For symptoms patient has tried:  Used drops for swimmers ear.  Overall getting:  Ongoing pain recurring over the past 3-4 days, and feeling worse.   Patient Care Team    Relationship Specialty Notifications Start  End  Mellody Dance, DO PCP - General Family Medicine  02/18/16   Martinique, Amy, MD Consulting Physician Dermatology  05/25/16    Comment: Dalphine Handing, MD Referring Physician Psychiatry  08/31/16    Comment: neuropsychiatry  Chari Manning, MD Attending Physician Orthopedic Surgery  09/22/16   Levin Erp, [place] Physician Assistant Gastroenterology  09/22/16   Jacelyn Pi, MD Consulting Physician Endocrinology  09/22/16   Marylynn Pearson, MD Consulting Physician Obstetrics and Gynecology  09/22/16   Macarthur Critchley, [place] Referring Physician Optometry  09/22/16     Past medical history, Surgical history, Family history reviewed and noted below, Social history, Allergies, and Medications have been entered into the medical record, reviewed and changed as needed.   Allergies  Allergen Reactions  . Januvia [Sitagliptin] Other (See Comments)    Pancreatitis  . Onglyza [Saxagliptin] Other (See Comments)    Pancreatitis to all DPP 4 inhibitor medications  . Cherry Swelling and Itching  . Lidocaine     Other reaction(s): Other (See Comments) Blisters    Review of Systems: - see above HPI for pertinent positives General:   No F/C, wt loss Pulm:   No DIB, pleuritic chest pain Card:  No CP, palpitations Abd:  No n/v/d or pain Ext:  No inc edema from baseline   Objective:   Blood pressure 113/79, pulse 77, weight 182 lb (82.6 kg), SpO2 98 %. Body mass index is 29.83 kg/m. General: Well Developed, well nourished, appropriate for stated age.  Neuro: Alert and oriented x3, extra-ocular muscles intact, sensation grossly intact.  HEENT: Normocephalic, atraumatic, pupils equal round reactive to light, neck supple, no masses, no painful lymphadenopathy, TM's intact B/L, acute findings: right TM retracted with whitish yellowish exudate over entire eardrum, loss of light reflex, unable to see incus, stapes, & malleus. Nares- patent, clear d/c, OP- clear, mild erythema, No TTP  sinuses Skin: Warm and dry, no gross rash. Cardiac: RRR, S1 S2,  no murmurs rubs or gallops.  Respiratory: ECTA B/L and A/P, Not using accessory muscles, speaking in full sentences- unlabored. Vascular:  No gross lower ext edema, cap RF less 2 sec. Psych: No HI/SI, judgement and insight good, Euthymic mood. Full Affect.

## 2018-03-21 ENCOUNTER — Encounter: Payer: Self-pay | Admitting: Family Medicine

## 2018-04-02 DIAGNOSIS — E7800 Pure hypercholesterolemia, unspecified: Secondary | ICD-10-CM | POA: Diagnosis not present

## 2018-04-02 DIAGNOSIS — E119 Type 2 diabetes mellitus without complications: Secondary | ICD-10-CM | POA: Diagnosis not present

## 2018-04-02 LAB — BASIC METABOLIC PANEL
BUN: 13 (ref 4–21)
Creatinine: 0.7 (ref 0.5–1.1)
Glucose: 160
Potassium: 5 (ref 3.4–5.3)
Sodium: 143 (ref 137–147)

## 2018-04-02 LAB — TSH: TSH: 0.93 (ref <=5.90)

## 2018-04-02 LAB — HEMOGLOBIN A1C: Hemoglobin A1C: 6.9

## 2018-04-02 LAB — LIPID PANEL
Cholesterol: 205 — AB (ref 0–200)
HDL: 42 (ref 35–70)
LDL Cholesterol: 137
Triglycerides: 131 (ref 40–160)

## 2018-04-17 DIAGNOSIS — E7800 Pure hypercholesterolemia, unspecified: Secondary | ICD-10-CM | POA: Diagnosis not present

## 2018-04-17 DIAGNOSIS — I10 Essential (primary) hypertension: Secondary | ICD-10-CM | POA: Diagnosis not present

## 2018-04-17 DIAGNOSIS — E119 Type 2 diabetes mellitus without complications: Secondary | ICD-10-CM | POA: Diagnosis not present

## 2018-04-23 ENCOUNTER — Other Ambulatory Visit: Payer: Self-pay | Admitting: Family Medicine

## 2018-04-23 DIAGNOSIS — F4320 Adjustment disorder, unspecified: Secondary | ICD-10-CM

## 2018-05-01 DIAGNOSIS — D2339 Other benign neoplasm of skin of other parts of face: Secondary | ICD-10-CM | POA: Diagnosis not present

## 2018-05-01 DIAGNOSIS — Z85828 Personal history of other malignant neoplasm of skin: Secondary | ICD-10-CM | POA: Diagnosis not present

## 2018-05-01 DIAGNOSIS — L814 Other melanin hyperpigmentation: Secondary | ICD-10-CM | POA: Diagnosis not present

## 2018-05-01 DIAGNOSIS — D2222 Melanocytic nevi of left ear and external auricular canal: Secondary | ICD-10-CM | POA: Diagnosis not present

## 2018-05-01 DIAGNOSIS — D485 Neoplasm of uncertain behavior of skin: Secondary | ICD-10-CM | POA: Diagnosis not present

## 2018-06-01 ENCOUNTER — Other Ambulatory Visit: Payer: Self-pay | Admitting: Family Medicine

## 2018-06-01 DIAGNOSIS — E559 Vitamin D deficiency, unspecified: Secondary | ICD-10-CM

## 2018-06-18 DIAGNOSIS — E7800 Pure hypercholesterolemia, unspecified: Secondary | ICD-10-CM | POA: Diagnosis not present

## 2018-06-18 LAB — LIPID PANEL
Cholesterol: 185 (ref 0–200)
HDL: 40 (ref 35–70)
LDL Cholesterol: 119
Triglycerides: 131 (ref 40–160)

## 2018-06-26 LAB — HM DIABETES EYE EXAM

## 2018-07-30 ENCOUNTER — Telehealth: Payer: Self-pay | Admitting: Family Medicine

## 2018-07-30 NOTE — Telephone Encounter (Signed)
Patient called states she is completely out of Rx / Prozac--- Just found out ( Capsules are cheaper than tablets) & is request refill be switched to Capsules : FLUoxetine (PROZAC) 20 MG tablet [[id number]]   Order Details  Dose, Route, Frequency: As Directed   Dispense Quantity: 90 tablet Refills: 1 Fills remaining: --        Sig: TAKE 1 TABLET BY MOUTH EVERY DAY       ----Forwarding message to medical assistant to send refill for Capsules to :   [id number] [address], [place] [phone] (Phone) [phone] (Fax)   --glh

## 2018-07-31 ENCOUNTER — Other Ambulatory Visit: Payer: Self-pay

## 2018-07-31 DIAGNOSIS — F4320 Adjustment disorder, unspecified: Secondary | ICD-10-CM

## 2018-07-31 MED ORDER — FLUOXETINE HCL 20 MG PO CAPS: 20.0000 mg | ORAL_CAPSULE | Freq: Every day | ORAL | 0 refills | Status: DC

## 2018-07-31 NOTE — Telephone Encounter (Signed)
Sent in for capsules.  Patient notified.MPulliam, CMA/RT(R)

## 2018-07-31 NOTE — Telephone Encounter (Signed)
Patient requesting change from tablet to capsule on the Prozac due to cost difference.  Sent new RX in and patient notified. MPulliam, CMA/RT(R)

## 2018-08-06 ENCOUNTER — Ambulatory Visit: Payer: 59 | Admitting: Family Medicine

## 2018-08-06 ENCOUNTER — Encounter: Payer: Self-pay | Admitting: Family Medicine

## 2018-08-06 VITALS — BP 120/78 | HR 75 | Temp 98.5°F | Ht 65.5 in | Wt 178.5 lb

## 2018-08-06 DIAGNOSIS — L509 Urticaria, unspecified: Secondary | ICD-10-CM

## 2018-08-06 DIAGNOSIS — L239 Allergic contact dermatitis, unspecified cause: Secondary | ICD-10-CM

## 2018-08-06 NOTE — Progress Notes (Signed)
Pt here for an acute care OV today   Impression and Recommendations:    1. Hive- of R hand   2. Allergic dermatitis      Allergic Dermatitis and HIVE of Right Hand:  -Appears to have entered her body with a small punctate lesion consistent with arthropod bite on right hand with surrounding hives-explained and pointed out this allergic reaction to patient - Advised the patient to take Allegra or Claritin during the day and then benadryl at night if needed.  -Discussed H1 plus H2 blockade and patient can use over-the-counter famotidine or ranitidine in addition to Benadryl or Allegra\ Claritin - Also informed the patient to continue use of her 2% Hydrocortisone cream.     Education and routine counseling performed. Handouts provided   Expresses verbal understanding and consents to current therapy and treatment regimen.  No barriers to understanding were identified.  Red flag symptoms and signs discussed in detail.  Patient expressed understanding regarding what to do in case of emergency/urgent symptoms   Please see AVS handed out to patient at the end of our visit for further patient instructions/ counseling done pertaining to today's office visit.   Return for f/up DM q 71mo inbtwn months you see Dr Soyla Murphy.     Note:  This document was prepared occasionally using Dragon voice recognition software and may include unintentional dictation errors in addition to a scribe.  This document serves as a record of services personally performed by Mellody Dance, DO. It was created on her behalf by Steva Colder, a trained medical scribe. The creation of this record is based on the scribe's personal observations and the provider's statements to them.   I have reviewed the above medical documentation for accuracy and completeness and I concur.  Mellody Dance, DO, D.O. 08/06/2018 6:27  PM         --------------------------------------------------------------------------------------------------------------------------------------------------------------------------------------------------------------------------------------------    Subjective:    CC:  Chief Complaint  Patient presents with  . Rash    HPI: Jessica Chang is a 47 y.o. female who presents to [place] [facility] today for issues as discussed below.  She notes that she woke up this morning with two non-pruritic red streaks to her right arm. She notes that the redness decreased since the onset of her symptoms. The initial area of redness was noted to her right posterior hand. She denies being scratched prior to the onset of her symptoms. She hasn't tried any medications for her symptoms. She denies tongue swelling and any other symptoms. She uses 2% hydrocortisone for her psoriasis.   No problems updated.   Wt Readings from Last 3 Encounters:  08/06/18 178 lb 8 oz (81 kg)  03/20/18 182 lb (82.6 kg)  12/04/17 177 lb 14.4 oz (80.7 kg)   BP Readings from Last 3 Encounters:  08/06/18 120/78  03/20/18 113/79  12/04/17 113/76   BMI Readings from Last 3 Encounters:  08/06/18 29.25 kg/m  03/20/18 29.83 kg/m  12/04/17 29.15 kg/m     Patient Care Team    Relationship Specialty Notifications Start End  Mellody Dance, DO PCP - General Family Medicine  02/18/16   Martinique, Amy, MD Consulting Physician Dermatology  05/25/16    Comment: Dalphine Handing, MD Referring Physician Psychiatry  08/31/16    Comment: neuropsychiatry  Chari Manning, MD Attending Physician Orthopedic Surgery  09/22/16   Levin Erp, [place] Physician Assistant Gastroenterology  09/22/16   Chalmers Cater,  Mindi Curling, MD Consulting Physician Endocrinology  09/22/16   Marylynn Pearson, MD Consulting Physician Obstetrics and Gynecology  09/22/16   Macarthur Critchley, [place] Referring Physician Optometry  09/22/16       Patient Active Problem List   Diagnosis Date Noted  . Hyperlipidemia associated with type 2 diabetes mellitus ([place]) 10/29/2012    Priority: High  . Hypertension associated with diabetes ([place]) 10/29/2012    Priority: High  . Diabetes ([place])     Priority: High  . Right serous otitis media 03/20/2018  . Acute otitis externa of right ear 03/20/2018  . Vitamin D deficiency 06/19/2017  . Adjustment disorder 03/06/2017  . Diabetic cataract ([place]) 08/31/2016  . h/o Drug-induced acute pancreatitis (2nd [place] or onglyza) 05/27/2016  . Elevated liver enzymes 05/27/2016  . Chronic Mild Hypercalcemia 05/27/2016  . Overweight (BMI 25.0-29.9) 02/21/2016  . Pain, abdominal, nonspecific 02/21/2016  . Skin lesion 02/21/2016  . Enthesopathy of hip 10/19/2014  . Cramp in muscle 08/13/2013      Past Medical History:  Diagnosis Date  . Acute pancreatitis   . Diabetes ([place])   . Gallstones   . Hyperlipidemia   . Hypertension   . Pneumonia   . Squamous cell carcinoma   . Thyroid disease      Past Surgical History:  Procedure Laterality Date  . CESAREAN SECTION  2008  . CHOLECYSTECTOMY    . HIP ARTHROSCOPY Right 2011, 2016  . KNEE ARTHROSCOPY Right 1988  . TONSILLECTOMY  1989     Family History  Problem Relation Age of Onset  . Heart disease Mother   . Basal cell carcinoma Mother   . Diabetes Father   . Hyperlipidemia Father   . Hypertension Father   . Colon polyps Father   . Prostate cancer Father   . Melanoma Father   . Aneurysm Father   . AAA (abdominal aortic aneurysm) Father 19       Thoracic aorta repair and AVR  . Alcohol abuse Sister   . Mental illness Sister   . Heart attack Paternal Aunt   . Melanoma Paternal Aunt   . Breast cancer Maternal Grandmother   . AAA (abdominal aortic aneurysm) Maternal Grandfather   . Diabetes Paternal Grandmother   . Stroke Paternal Grandfather   . Melanoma Paternal Grandfather      Social History   Socioeconomic History  .  Marital status: Married    Spouse name: Not on file  . Number of children: 1  . Years of education: Not on file  . Highest education level: Not on file  Occupational History  . Occupation: physical therapy  Social Needs  . Financial resource strain: Not on file  . Food insecurity:    Worry: Not on file    Inability: Not on file  . Transportation needs:    Medical: Not on file    Non-medical: Not on file  Tobacco Use  . Smoking status: Former Smoker    Packs/day: 0.25    Years: 5.00    Pack years: 1.25    Last attempt to quit: 09/19/2005    Years since quitting: 12.8  . Smokeless tobacco: Never Used  . Tobacco comment: Quit 2007  Substance and Sexual Activity  . Alcohol use: Yes    Comment: Drinks alcohol four times a month.  . Drug use: No  . Sexual activity: Yes    Birth control/protection: IUD  Lifestyle  . Physical activity:    Days per week: Not on file  Minutes per session: Not on file  . Stress: Not on file  Relationships  . Social connections:    Talks on phone: Not on file    Gets together: Not on file    Attends religious service: Not on file    Active member of club or organization: Not on file    Attends meetings of clubs or organizations: Not on file    Relationship status: Not on file  . Intimate partner violence:    Fear of current or ex partner: Not on file    Emotionally abused: Not on file    Physically abused: Not on file    Forced sexual activity: Not on file  Other Topics Concern  . Not on file  Social History Narrative  . Not on file     Current Meds  Medication Sig  . atorvastatin (LIPITOR) 10 MG tablet Take 10 mg by mouth daily.  . canagliflozin (INVOKANA) 300 MG TABS tablet Take 300 mg by mouth daily before breakfast.  . Cholecalciferol (VITAMIN D3) 5000 units TABS 5,000 IU OTC vitamin D3 daily.  Marland Kitchen FLUoxetine (PROZAC) 20 MG capsule Take 1 capsule (20 mg total) by mouth daily.  Marland Kitchen glucose blood test strip Check 2 hour postprandial and  fasting blood sugars daily; also if you feel poorly  . Ibuprofen 200 MG CAPS Take 600 mg by mouth as needed.   Marland Kitchen levonorgestrel (MIRENA) 20 MCG/24HR IUD Placed January, 2009   . levothyroxine (SYNTHROID, LEVOTHROID) 100 MCG tablet Take 100 mcg by mouth daily.  Marland Kitchen lisinopril (PRINIVIL,ZESTRIL) 20 MG tablet Take 20 mg by mouth daily.  . metFORMIN (GLUCOPHAGE-XR) 500 MG 24 hr tablet Take 2 tablets by mouth 2 (two) times daily.    Allergies:  Allergies  Allergen Reactions  . Januvia [Sitagliptin] Other (See Comments)    Pancreatitis  . Onglyza [Saxagliptin] Other (See Comments)    Pancreatitis to all DPP 4 inhibitor medications  . Cherry Swelling and Itching  . Lidocaine     Other reaction(s): Other (See Comments) Blisters     Review of Systems: General:   Denies fever, chills, unexplained weight loss.  Optho/Auditory:   Denies visual changes, blurred vision/LOV Respiratory:   Denies wheeze, DOE more than baseline levels.   Cardiovascular:   Denies chest pain, palpitations, new onset peripheral edema  Gastrointestinal:   Denies nausea, vomiting, diarrhea, abd pain.  Genitourinary: Denies dysuria, freq/ urgency, flank pain or discharge from genitals.  Endocrine:     Denies hot or cold intolerance, polyuria, polydipsia. Musculoskeletal:   Denies unexplained myalgias, joint swelling, unexplained arthralgias, gait problems.  Skin:  (+) rash and redness  Neurological:     Denies dizziness, unexplained weakness, numbness  Psychiatric/Behavioral:   Denies mood changes, suicidal or homicidal ideations, hallucinations    Objective:   Blood pressure 120/78, pulse 75, temperature 98.5 F (36.9 C), height 5' 5.5" (1.664 m), weight 178 lb 8 oz (81 kg), SpO2 99 %. Body mass index is 29.25 kg/m. General:  Well Developed, well nourished, appropriate for stated age.  Neuro:  Alert and oriented,  extra-ocular muscles intact  HEENT:  Normocephalic, atraumatic, neck supple Skin:  Punctate skin  lesion in the soft tissues on the dorsal surface in thenar web space between right 1st digit and thumb with surrounding focal erythema and slightly raised skin with barely noticeable red line that goes from the punctate skin lesion up the arm to about the anterior deltoid region and not beyond. warm, pink.  Cardiac:  RRR, S1 S2 Respiratory:  ECTA B/L and A/P, Not using accessory muscles, speaking in full sentences- unlabored. Vascular:  Ext warm, no cyanosis apprec.; cap RF less 2 sec. Psych:  No HI/SI, judgement and insight good, Euthymic mood. Full Affect.

## 2018-09-19 HISTORY — PX: CATARACT EXTRACTION: SUR2

## 2018-10-11 DIAGNOSIS — E119 Type 2 diabetes mellitus without complications: Secondary | ICD-10-CM | POA: Diagnosis not present

## 2018-10-11 DIAGNOSIS — E7800 Pure hypercholesterolemia, unspecified: Secondary | ICD-10-CM | POA: Diagnosis not present

## 2018-10-11 DIAGNOSIS — E039 Hypothyroidism, unspecified: Secondary | ICD-10-CM | POA: Diagnosis not present

## 2018-10-11 LAB — LIPID PANEL
Cholesterol: 186 (ref 0–200)
HDL: 46 (ref 35–70)
LDL Cholesterol: 116
Triglycerides: 122 (ref 40–160)

## 2018-10-11 LAB — BASIC METABOLIC PANEL
BUN: 15 (ref 4–21)
Creatinine: 0.8 (ref 0.5–1.1)
Glucose: 120
Potassium: 5 (ref 3.4–5.3)
Sodium: 139 (ref 137–147)

## 2018-10-11 LAB — HEMOGLOBIN A1C: Hemoglobin A1C: 7.1

## 2018-10-11 LAB — HEPATIC FUNCTION PANEL
ALT: 63 — AB (ref 7–35)
AST: 35 (ref 13–35)
Alkaline Phosphatase: 65 (ref 25–125)

## 2018-10-18 DIAGNOSIS — E039 Hypothyroidism, unspecified: Secondary | ICD-10-CM | POA: Diagnosis not present

## 2018-10-18 DIAGNOSIS — E7800 Pure hypercholesterolemia, unspecified: Secondary | ICD-10-CM | POA: Diagnosis not present

## 2018-10-18 DIAGNOSIS — E119 Type 2 diabetes mellitus without complications: Secondary | ICD-10-CM | POA: Diagnosis not present

## 2018-10-23 ENCOUNTER — Other Ambulatory Visit: Payer: Self-pay | Admitting: Family Medicine

## 2018-10-23 DIAGNOSIS — M79602 Pain in left arm: Secondary | ICD-10-CM | POA: Insufficient documentation

## 2018-10-23 DIAGNOSIS — R202 Paresthesia of skin: Secondary | ICD-10-CM | POA: Insufficient documentation

## 2018-10-23 DIAGNOSIS — F4320 Adjustment disorder, unspecified: Secondary | ICD-10-CM

## 2018-10-23 DIAGNOSIS — M542 Cervicalgia: Secondary | ICD-10-CM | POA: Diagnosis not present

## 2018-10-26 DIAGNOSIS — M542 Cervicalgia: Secondary | ICD-10-CM | POA: Diagnosis not present

## 2018-11-02 DIAGNOSIS — D352 Benign neoplasm of pituitary gland: Secondary | ICD-10-CM | POA: Diagnosis not present

## 2018-11-02 DIAGNOSIS — D443 Neoplasm of uncertain behavior of pituitary gland: Secondary | ICD-10-CM | POA: Diagnosis not present

## 2018-11-02 LAB — TSH: TSH: 0.38 — AB (ref <=5.90)

## 2018-11-26 ENCOUNTER — Encounter: Payer: Self-pay | Admitting: Family Medicine

## 2018-12-18 DIAGNOSIS — E039 Hypothyroidism, unspecified: Secondary | ICD-10-CM | POA: Diagnosis not present

## 2018-12-18 LAB — TSH: TSH: 1.75 (ref 0.41–5.90)

## 2019-01-02 DIAGNOSIS — M50222 Other cervical disc displacement at C5-C6 level: Secondary | ICD-10-CM | POA: Diagnosis not present

## 2019-01-02 DIAGNOSIS — M542 Cervicalgia: Secondary | ICD-10-CM | POA: Diagnosis not present

## 2019-01-02 DIAGNOSIS — E236 Other disorders of pituitary gland: Secondary | ICD-10-CM | POA: Diagnosis not present

## 2019-01-09 ENCOUNTER — Encounter: Payer: Self-pay | Admitting: Family Medicine

## 2019-01-14 ENCOUNTER — Other Ambulatory Visit: Payer: Self-pay | Admitting: Family Medicine

## 2019-01-14 DIAGNOSIS — M5412 Radiculopathy, cervical region: Secondary | ICD-10-CM | POA: Diagnosis not present

## 2019-01-14 DIAGNOSIS — F4320 Adjustment disorder, unspecified: Secondary | ICD-10-CM

## 2019-01-14 DIAGNOSIS — M4802 Spinal stenosis, cervical region: Secondary | ICD-10-CM | POA: Diagnosis not present

## 2019-01-14 DIAGNOSIS — M50222 Other cervical disc displacement at C5-C6 level: Secondary | ICD-10-CM | POA: Diagnosis not present

## 2019-01-23 DIAGNOSIS — E119 Type 2 diabetes mellitus without complications: Secondary | ICD-10-CM | POA: Insufficient documentation

## 2019-01-29 DIAGNOSIS — M50222 Other cervical disc displacement at C5-C6 level: Secondary | ICD-10-CM | POA: Diagnosis not present

## 2019-01-29 DIAGNOSIS — M50221 Other cervical disc displacement at C4-C5 level: Secondary | ICD-10-CM | POA: Diagnosis not present

## 2019-01-29 DIAGNOSIS — M4802 Spinal stenosis, cervical region: Secondary | ICD-10-CM | POA: Diagnosis not present

## 2019-01-29 DIAGNOSIS — M50223 Other cervical disc displacement at C6-C7 level: Secondary | ICD-10-CM | POA: Diagnosis not present

## 2019-01-29 DIAGNOSIS — M50121 Cervical disc disorder at C4-C5 level with radiculopathy: Secondary | ICD-10-CM | POA: Diagnosis not present

## 2019-01-29 DIAGNOSIS — M4722 Other spondylosis with radiculopathy, cervical region: Secondary | ICD-10-CM | POA: Diagnosis not present

## 2019-04-08 ENCOUNTER — Other Ambulatory Visit: Payer: Self-pay | Admitting: Family Medicine

## 2019-04-08 ENCOUNTER — Telehealth: Payer: Self-pay | Admitting: Family Medicine

## 2019-04-08 ENCOUNTER — Other Ambulatory Visit: Payer: Self-pay

## 2019-04-08 DIAGNOSIS — F4320 Adjustment disorder, unspecified: Secondary | ICD-10-CM

## 2019-04-08 MED ORDER — FLUOXETINE HCL 20 MG PO CAPS: ORAL_CAPSULE | ORAL | 0 refills | Status: DC

## 2019-04-08 NOTE — Telephone Encounter (Signed)
Patient knows she is due for a chronic/med follow up and has made one for 04/24/19, but she is currently out of her Prozac. She is requesting any sort of refill to get her by, and if [facility] [address].

## 2019-04-08 NOTE — Telephone Encounter (Signed)
Refill sent in for 30 days. MPulliam, CMA/RT(R)

## 2019-04-19 ENCOUNTER — Other Ambulatory Visit: Payer: Self-pay | Admitting: Obstetrics and Gynecology

## 2019-04-19 DIAGNOSIS — Z9189 Other specified personal risk factors, not elsewhere classified: Secondary | ICD-10-CM

## 2019-04-24 ENCOUNTER — Encounter: Payer: Self-pay | Admitting: Family Medicine

## 2019-04-24 ENCOUNTER — Ambulatory Visit (INDEPENDENT_AMBULATORY_CARE_PROVIDER_SITE_OTHER): Payer: 59 | Admitting: Family Medicine

## 2019-04-24 ENCOUNTER — Other Ambulatory Visit: Payer: Self-pay

## 2019-04-24 VITALS — BP 124/78 | HR 78 | Temp 98.6°F | Ht 65.5 in | Wt 179.0 lb

## 2019-04-24 DIAGNOSIS — E663 Overweight: Secondary | ICD-10-CM

## 2019-04-24 DIAGNOSIS — Z889 Allergy status to unspecified drugs, medicaments and biological substances status: Secondary | ICD-10-CM

## 2019-04-24 DIAGNOSIS — E785 Hyperlipidemia, unspecified: Secondary | ICD-10-CM

## 2019-04-24 DIAGNOSIS — E1169 Type 2 diabetes mellitus with other specified complication: Secondary | ICD-10-CM

## 2019-04-24 DIAGNOSIS — F4320 Adjustment disorder, unspecified: Secondary | ICD-10-CM | POA: Diagnosis not present

## 2019-04-24 DIAGNOSIS — E1136 Type 2 diabetes mellitus with diabetic cataract: Secondary | ICD-10-CM

## 2019-04-24 DIAGNOSIS — E0800 Diabetes mellitus due to underlying condition with hyperosmolarity without nonketotic hyperglycemic-hyperosmolar coma (NKHHC): Secondary | ICD-10-CM | POA: Diagnosis not present

## 2019-04-24 MED ORDER — FLUOXETINE HCL 40 MG PO CAPS: 40.0000 mg | ORAL_CAPSULE | Freq: Every day | ORAL | 1 refills | Status: DC

## 2019-04-24 MED ORDER — EPINEPHRINE 0.3 MG/0.3ML IJ SOAJ: 0.3000 mg | INTRAMUSCULAR | 1 refills | Status: AC | PRN

## 2019-04-24 NOTE — Progress Notes (Signed)
Virtual / live video office visit note for [employer], D.O- Primary Care Physician at [facility]   I connected with current patient today and beyond visually recognizing the correct individual, I verified that I am speaking with the correct person using two identifiers.   Location of the patient: Home  Location of the provider: Office Only the patient (+/- their family members at pt's discretion) and myself were participating in the encounter    - This visit type was conducted due to national recommendations for restrictions regarding the COVID-19 Pandemic (e.g. social distancing) in an effort to limit this patient's exposure and mitigate transmission in our community.  This format is felt to be most appropriate for this patient at this time.   - The patient did have access to video technology today yet, we had technical difficulties with this method, requiring transitioning to audio only.    - No physical exam could be performed with this format, beyond that communicated to [place] by the patient/ family members as noted.   - Additionally my office staff/ schedulers discussed with the patient that there may be a monetary charge related to this service, depending on patient's medical insurance.   The patient expressed understanding, and agreed to proceed.      History of Present Illness:   Recent Cervical Fusion (May) Had surgery; states it went "fabulous."  Dr. Vertell Limber did it on May 12th.  Had anterior cervical fusion 4,5,6,7.  Notes that her recovery is going well.  Today is day two back at work and she's experiencing more muscle pain.  "I haven't done anything 5 hours in a row without sitting down and resting my head back on something without support."  Says since fixing her neck, she no longer has tingling in her feet when she walks.  Lifestyle Since Surgery Says "between the surgery and pain and everything, I'm happy my A1c was the same."  Notes she sat and did nothing for a  month and was also "jacked up on steroids" during that time.  Says she's been walking four miles per day, about 23 miles per week around the neighborhood.  Says the exercise hasn't done anything for her weight and she's gained five lbs.  Feels that steroids have helped her lose weight in the past.  Allergy Concerns States she had an allergic reaction to a peach recently and needs a new referral to allergist.  In the past, patient had a reaction to cherries.  With the peaches, her mouth got tingly and her throat got itchy, "felt like something was in my throat."  Denies closure or anaphylaxis.  Says "cherries does closure."  Notes even some "cherry flavored stuff" like balms make her swell.  Endocrine Concerns Addressed with Dr. Chalmers Cater.  Eye Concerns Is using steroid drops in her eyes because her cataract is worse.  Has a referral to eye doctor for further assessment in future.  Mood States she wants to "increase her happy pills" to 40 mg.  Says after her surgery and recent acute stress, "the crying started again," so she bumped her dose up to 40 on her own.  Says she feels like her "lack of purpose" while unemployed for 4 months made her feel much more depressed.  Female Health Patient follows up with Marylynn Pearson.  Notes she has to obtain special mammograms with MRI's due to being in a certain "testing group."  DM HPI:  Had her A1c done 2 weeks ago  at 7.2.  Continues to see Dr. Chalmers Cater and follows up with her next on on Monday.  -  She has not been working on diet and exercise for diabetes.  Medication compliance - continues as prescribed.  Home fasting glucose readings: Says it's been sky high in the mornings, around 200.  "It's never been that high, ever."  Patient started a prednisone eyedrop and wonders if this affected her sugars.  2 hr PP: not checking   Denies polyuria/polydipsia.  Denies hypo/ hyperglycemia symptoms  Last diabetic eye exam was  Lab Results    Component Value Date   HMDIABEYEEXA No Retinopathy 06/26/2018    Last A1C in the office was:  Lab Results  Component Value Date   HGBA1C 7.1 10/11/2018   HGBA1C 6.9 04/02/2018   HGBA1C 6.6 (H) 06/16/2017    Lab Results  Component Value Date   MICROALBUR 10 08/31/2016   LDLCALC 116 10/11/2018   CREATININE 0.8 10/11/2018    Wt Readings from Last 3 Encounters:  04/24/19 179 lb (81.2 kg)  08/06/18 178 lb 8 oz (81 kg)  03/20/18 182 lb (82.6 kg)    BP Readings from Last 3 Encounters:  04/24/19 124/78  08/06/18 120/78  03/20/18 113/79    1. HTN HPI:  -  Her blood pressure may be controlled at home.  Pt is not regularly checking it at home.   Does not check it that often, but when she does check, it's around 118/70, 112/65.  Says "Today is the highest it's been in forever."  - Patient reports good compliance with blood pressure medications  - Denies medication S-E   - Smoking Status noted   - She denies new onset of: chest pain, exercise intolerance, shortness of breath, dizziness, visual changes, headache, lower extremity swelling or claudication.   Last 3 blood pressure readings in our office are as follows: BP Readings from Last 3 Encounters:  04/24/19 124/78  08/06/18 120/78  03/20/18 113/79    Filed Weights   04/24/19 1453  Weight: 179 lb (81.2 kg)      Impression and Recommendations:    1. Diabetes mellitus due to underlying condition with hyperosmolarity without coma, without long-term current use of insulin ([place])   2. Hyperlipidemia associated with type 2 diabetes mellitus ([place])   3. Diabetic cataract ([place])   4. Adjustment disorder, unspecified type   5. Multiple allergies-   environment, food, medications etc.   6. Overweight (BMI 25.0-29.9)     Diabetes Mellitus - Fasting sugars have been high but overall stable at this time. A1c 7.2 last check. - Continue treatment plan as established.  See med list below. - Patient tolerating meds well  without complication.  Denies S-E - Patient knows to continue to obtain management through Dr. Chalmers Cater.  - Counseled patient on pathophysiology of disease and discussed various treatment options, which often includes dietary and lifestyle modifications as first line.  Importance of low carb/ketogenic diet discussed with patient in addition to regular exercise.   - Check FBS and 2 hours after the biggest meal of your day.  Keep log and bring in next OV for endocrinologist review.   Also, if you ever feel poorly, please check your blood pressure and blood sugar, as one or the other could be the cause of your symptoms.  - Being a diabetic, you need yearly eye and foot exams. Make appt.for diabetic eye exam.   Hypertension Associated with Diabetes Mellitus - Stable at this time. - Continue  treatment plan as prescribed.  See med list below. - Patient tolerating meds well without complication.  Denies S-E  - Lifestyle changes such as prudent diet and engaging in a regular exercise program discussed with patient.  Educational handouts provided.  - Ambulatory BP monitoring encouraged. Keep log and bring in next OV.  - Will continue to monitor.   Adjustment Disorder - Per patient, mood not stable on current management. - Feeling more depressed, tearful; less anxiety or excess worry.  - Dose of fluoxetine increased to 40 mg.  - Reviewed the "spokes of the wheel" of mood and health management.  Stressed the importance of ongoing prudent habits, including regular exercise, appropriate sleep hygiene, healthful dietary habits, and prayer/meditation to relax.  - Will continue to monitor.   Multiple Allergies - Need for Formal Allergy Testing - In the past, patient notes she had allergic reaction to cherries and peaches. - Per patient would like formal allergy testing and evaluation. - Referral placed today.  - Need for epi pen.  Rx provided today.  - Attend follow-up as recommended and  established.   Cataracts - Followed by Eye Doctor - Patient was referred to eye doctor for surgery in future.  - Patient knows to attend her appointments and continue follow-up and assessment as established.   BMI Counseling - BMI of 29.33 Explained to patient what BMI refers to, and what it means medically.    Told patient to think about it as a "medical risk stratification measurement" and how increasing BMI is associated with increasing risk/ or worsening state of various diseases such as hypertension, hyperlipidemia, diabetes, premature OA, depression etc.  American Heart Association guidelines for healthy diet, basically Mediterranean diet, and exercise guidelines of 30 minutes 5 days per week or more discussed in detail.  - Advised patient that weight loss is mainly about diet. - Encouraged patient to watch her eating habits carefully. - Encouraged patient to use LoseIt App to track.   Lifestyle & Preventative Health Maintenance - Advised patient to continue working toward exercising to improve overall mental, physical, and emotional health.    - Healthy dietary habits encouraged, including low-carb, and high amounts of lean protein in diet.   - Patient should also consume adequate amounts of water.  - Health counseling performed.  All questions answered.  - As part of my medical decision making, I reviewed the following data within the [place] History obtained from pt /family, CMA notes reviewed and incorporated if applicable, Labs reviewed, Radiograph/ tests reviewed if applicable and OV notes from prior OV's with me, as well as other specialists she/he has seen since seeing me last, were all reviewed and used in my medical decision making process today.   - Additionally, discussion had with patient regarding txmnt plan, their biases about that plan etc were used in my medical decision making today.   - The patient agreed with the plan and demonstrated an  understanding of the instructions.   No barriers to understanding were identified.   - Red flag symptoms and signs discussed in detail.  Patient expressed understanding regarding what to do in case of emergency\ urgent symptoms.  The patient was advised to call back or seek an in-person evaluation if the symptoms worsen or if the condition fails to improve as anticipated.   Return for 34mo chronic care ( see Dr Chalmers Cater in btwn).    Orders Placed This Encounter  Procedures   Ambulatory referral to Allergy  Meds ordered this encounter  Medications   FLUoxetine (PROZAC) 40 MG capsule    Sig: Take 1 capsule (40 mg total) by mouth daily.    Dispense:  90 capsule    Refill:  1   EPINEPHrine 0.3 mg/0.3 mL IJ SOAJ injection    Sig: Inject 0.3 mLs (0.3 mg total) into the muscle as needed for anaphylaxis.    Dispense:  2 each    Refill:  1    Medications Discontinued During This Encounter  Medication Reason   FLUoxetine (PROZAC) 20 MG capsule Reorder      I provided 28 minutes of non-face-to-face time during this encounter,with over 50% of the time in direct counseling on patients medical conditions/ medical concerns.  Additional time was spent with charting and coordination of care after the actual visit commenced.   Note:  This note was prepared with assistance of Dragon voice recognition software. Occasional wrong-word or sound-a-like substitutions may have occurred due to the inherent limitations of voice recognition software.  This document serves as a record of services personally performed by Mellody Dance, DO. It was created on her behalf by Toni Amend, a trained medical scribe. The creation of this record is based on the scribe's personal observations and the provider's statements to them.   I have reviewed the above medical documentation for accuracy and completeness and I concur.  Mellody Dance, DO 04/27/2019 3:20 PM        Patient Care Team     Relationship Specialty Notifications Start End  Mellody Dance, DO PCP - General Family Medicine  02/18/16   [place], Amy, MD Consulting Physician Dermatology  05/25/16    Comment: Murrell Redden, MD Attending Physician Orthopedic Surgery  09/22/16   Levin Erp, [place] Physician Assistant Gastroenterology  09/22/16   Jacelyn Pi, MD Consulting Physician Endocrinology  09/22/16   Marylynn Pearson, MD Consulting Physician Obstetrics and Gynecology  09/22/16   Macarthur Critchley, [place] Referring Physician Optometry  09/22/16   Darleen Crocker, MD Consulting Physician Ophthalmology  04/24/19     -Vitals obtained; medications/ allergies reconciled;  personal medical, social, Sx etc.histories were updated by CMA, reviewed by me and are reflected in chart  Patient Active Problem List   Diagnosis Date Noted   Hyperlipidemia associated with type 2 diabetes mellitus ([place]) 10/29/2012    Priority: High   Hypertension associated with diabetes ([place]) 10/29/2012    Priority: High   Diabetes ([place])     Priority: High   Multiple allergies 05/18/2019   Right serous otitis media 03/20/2018   Acute otitis externa of right ear 03/20/2018   Vitamin D deficiency 06/19/2017   Adjustment disorder 03/06/2017   Diabetic cataract ([place]) 08/31/2016   h/o Drug-induced acute pancreatitis (2nd januvia or onglyza) 05/27/2016   Elevated liver enzymes 05/27/2016   Chronic Mild Hypercalcemia 05/27/2016   Overweight (BMI 25.0-29.9) 02/21/2016   Pain, abdominal, nonspecific 02/21/2016   Skin lesion 02/21/2016   Enthesopathy of hip 10/19/2014   Cramp in muscle 08/13/2013     Current Meds  Medication Sig   atorvastatin (LIPITOR) 10 MG tablet Take 10 mg by mouth daily.   canagliflozin (INVOKANA) 300 MG TABS tablet Take 300 mg by mouth daily before breakfast.   Cholecalciferol (VITAMIN D3) 5000 units TABS 5,000 IU OTC vitamin D3 daily.   FLUoxetine (PROZAC) 40 MG capsule Take 1 capsule (40 mg  total) by mouth daily.   glucose blood test strip Check 2 hour postprandial and  fasting blood sugars daily; also if you feel poorly   Ibuprofen 200 MG CAPS Take 600 mg by mouth as needed.    levonorgestrel (MIRENA) 20 MCG/24HR IUD Placed January, 2009    levothyroxine (SYNTHROID, LEVOTHROID) 100 MCG tablet Take 100 mcg by mouth daily.   lisinopril (PRINIVIL,ZESTRIL) 20 MG tablet Take 20 mg by mouth daily.   metFORMIN (GLUCOPHAGE-XR) 500 MG 24 hr tablet Take 2 tablets by mouth 2 (two) times daily.   [DISCONTINUED] FLUoxetine (PROZAC) 20 MG capsule TAKE 1 CAPSULE BY MOUTH EVERY DAY     Allergies  Allergen Reactions   Januvia [Sitagliptin] Other (See Comments)    Pancreatitis   Onglyza [Saxagliptin] Other (See Comments)    Pancreatitis to all DPP 4 inhibitor medications   Cherry Swelling and Itching   Lidocaine     Other reaction(s): Other (See Comments) Blisters   Peach [Prunus Persica] Other (See Comments)    Facial tingling     ROS:  See above HPI for pertinent positives and negatives   Objective:   Blood pressure 124/78, pulse 78, temperature 98.6 F (37 C), height 5' 5.5" (1.664 m), weight 179 lb (81.2 kg).  (if some vitals are omitted, this means that patient was UNABLE to obtain them even though they were asked to get them prior to OV today.  They were asked to call us at their earliest convenience with these once obtained.)  General: A & O * 3; visually in no acute distress; in usual state of health.  Skin: Visible skin appears normal and pt's usual skin color HEENT:  EOMI, head is normocephalic and atraumatic.  Sclera are anicteric. Neck has a good range of motion.  Lips are noncyanotic Chest: normal chest excursion and movement Respiratory: speaking in full sentences, no conversational dyspnea; no use of accessory muscles Psych: insight good, mood- appears full

## 2019-05-18 DIAGNOSIS — Z889 Allergy status to unspecified drugs, medicaments and biological substances status: Secondary | ICD-10-CM | POA: Insufficient documentation

## 2019-05-22 ENCOUNTER — Ambulatory Visit (INDEPENDENT_AMBULATORY_CARE_PROVIDER_SITE_OTHER): Payer: 59 | Admitting: Allergy

## 2019-05-22 ENCOUNTER — Encounter: Payer: Self-pay | Admitting: Allergy

## 2019-05-22 ENCOUNTER — Other Ambulatory Visit: Payer: Self-pay

## 2019-05-22 VITALS — BP 120/72 | HR 78 | Temp 97.7°F | Resp 18 | Ht 67.0 in | Wt 179.8 lb

## 2019-05-22 DIAGNOSIS — H1013 Acute atopic conjunctivitis, bilateral: Secondary | ICD-10-CM

## 2019-05-22 DIAGNOSIS — T7800XD Anaphylactic reaction due to unspecified food, subsequent encounter: Secondary | ICD-10-CM | POA: Diagnosis not present

## 2019-05-22 DIAGNOSIS — T781XXA Other adverse food reactions, not elsewhere classified, initial encounter: Secondary | ICD-10-CM

## 2019-05-22 DIAGNOSIS — T7819XA Other adverse food reactions, not elsewhere classified, initial encounter: Secondary | ICD-10-CM

## 2019-05-22 DIAGNOSIS — J3089 Other allergic rhinitis: Secondary | ICD-10-CM

## 2019-05-22 NOTE — Progress Notes (Signed)
New Patient Note  RE: Jessica Chang MRN: [id number] DOB: 05-12-1971 Date of Office Visit: 05/22/2019  Referring provider: Mellody Dance, DO Primary care provider: Mellody Dance, DO  Chief Complaint: environmental and food allergy  History of present illness: Jessica Chang is a 48 y.o. female presenting today for consultation for food and environmental allergy.    Cherries - 2-3 years ago she was eating cherries and she states it felt like cherry skin was stuck in her throat.  Then she states her lips and tongue started to swell.  She feels she had eaten about 25 cherries at this time.  She drank water and states symptoms resolved without intervention.     About a month later she tried cherries again and ate 2 cherries and developed chest tightness and throat swelling.  She took benadryl and symptoms improve.  Several months later during winter she made a dessert made with maraschino cherries.  She ate a piece of the dessert that didn't contain cherries to see if she could tolerate it and developed oral swelling.  She even reports that cherry flavored lip balm has caused lips to blister.    This summer while in [place] she ate a couple bites of peaches with issue but then ate a whole peach and developed a strange throat sensation and a numbing mouth sensation.  `  She does report a lot of bloating but has not identified particular foods other than possibly dairy and bread products.  She states she does feel better when she avoids bread products.   She reports itchy/watery eyes, nasal congestion/drainage, sneezing, HA.   She states she had one occasion with the soccer field at her job was re-done with a different grass turf and she felt like her breathing was "tight" and she did use someone elses albuterol inhaler.  This is the only time it has happened.   She takes zyrtec now as needed.  She states she used to take zyrtec daily for years in the past.   Uses flonase is uses as needed  for nasal congestion.   No history of eczema or asthma in the past.    Review of systems: Review of Systems  Constitutional: Negative for chills, fever and malaise/fatigue.  HENT: Negative for congestion, ear discharge, nosebleeds, sinus pain and sore throat.   Eyes: Negative for pain, discharge and redness.  Respiratory: Negative for cough, shortness of breath and wheezing.   Cardiovascular: Negative for chest pain.  Gastrointestinal: Negative for abdominal pain, constipation, diarrhea, heartburn, nausea and vomiting.  Musculoskeletal: Negative for joint pain.  Skin: Negative for itching and rash.  Neurological: Negative for headaches.    All other systems negative unless noted above in HPI  Past medical history: Past Medical History:  Diagnosis Date  . Acute pancreatitis   . Diabetes ([place])   . Gallstones   . Hyperlipidemia   . Hypertension   . Pneumonia   . Squamous cell carcinoma   . Thyroid disease     Past surgical history: Past Surgical History:  Procedure Laterality Date  . CESAREAN SECTION  2008  . CHOLECYSTECTOMY    . HIP ARTHROSCOPY Right 2011, 2016  . KNEE ARTHROSCOPY Right 1988  . TONSILLECTOMY  1989    Family history:  Family History  Problem Relation Age of Onset  . Heart disease Mother   . Basal cell carcinoma Mother   . Diabetes Father   . Hyperlipidemia Father   . Hypertension Father   . Colon  polyps Father   . Prostate cancer Father   . Melanoma Father   . Aneurysm Father   . AAA (abdominal aortic aneurysm) Father 37       Thoracic aorta repair and AVR  . Alcohol abuse Sister   . Mental illness Sister   . Asthma Sister   . Urticaria Sister   . Allergic rhinitis Sister   . Heart attack Paternal Aunt   . Melanoma Paternal Aunt   . Breast cancer Maternal Grandmother   . AAA (abdominal aortic aneurysm) Maternal Grandfather   . Diabetes Paternal Grandmother   . Stroke Paternal Grandfather   . Melanoma Paternal Grandfather     Social  history: Lives in a home with carpeting with electric heating with central cooling.  Dog in the home.  No concern for water damage, mildew or roaches in the home.  She is a Psychologist, educational.  Tobacco Use  . Smoking status: Former Smoker    Packs/day: 0.25    Years: 5.00    Pack years: 1.25    Quit date: 09/19/2005    Years since quitting: 13.6  . Smokeless tobacco: Never Used  . Tobacco comment: Quit 2007    Medication List: Allergies as of 05/22/2019      Reactions   Januvia [sitagliptin] Other (See Comments)   Pancreatitis   Onglyza [saxagliptin] Other (See Comments)   Pancreatitis to all DPP 4 inhibitor medications   Cherry Swelling, Itching   Lidocaine    Other reaction(s): Other (See Comments) Blisters   Peach [prunus Persica] Other (See Comments)   Facial tingling      Medication List       Accurate as of May 22, 2019  5:17 PM. If you have any questions, ask your nurse or doctor.        atorvastatin 10 MG tablet Commonly known as: LIPITOR Take 10 mg by mouth daily.   cetirizine 10 MG tablet Commonly known as: ZYRTEC Take 10 mg by mouth daily as needed for allergies.   EPINEPHrine 0.3 mg/0.3 mL Soaj injection Commonly known as: EPI-PEN Inject 0.3 mLs (0.3 mg total) into the muscle as needed for anaphylaxis.   FLUoxetine 40 MG capsule Commonly known as: PROZAC Take 1 capsule (40 mg total) by mouth daily.   glucose blood test strip Check 2 hour postprandial and fasting blood sugars daily; also if you feel poorly   HAIR/SKIN/NAILS/BIOTIN PO Take 1 tablet by mouth daily as needed.   Ibuprofen 200 MG Caps Take 600 mg by mouth as needed.   Invokana 300 MG Tabs tablet Generic drug: canagliflozin Take 300 mg by mouth daily before breakfast.   levonorgestrel 20 MCG/24HR IUD Commonly known as: MIRENA Placed January, 2009   levothyroxine 100 MCG tablet Commonly known as: SYNTHROID Take 100 mcg by mouth daily.   levothyroxine 88  MCG tablet Commonly known as: SYNTHROID 1 TABLET IN THE MORNING ON AN EMPTY STOMACH ONCE A DAY ORALLY 90   lisinopril 20 MG tablet Commonly known as: ZESTRIL Take 20 mg by mouth daily.   Melatonin 5 MG Tabs Take 5-10 mg by mouth daily as needed.   metFORMIN 500 MG 24 hr tablet Commonly known as: GLUCOPHAGE-XR Take 2 tablets by mouth 2 (two) times daily.   Vitamin D3 125 MCG (5000 UT) Tabs 5,000 IU OTC vitamin D3 daily.       Known medication allergies: Allergies  Allergen Reactions  . Januvia [Sitagliptin] Other (See Comments)    Pancreatitis  .  Onglyza [Saxagliptin] Other (See Comments)    Pancreatitis to all DPP 4 inhibitor medications  . Cherry Swelling and Itching  . Lidocaine     Other reaction(s): Other (See Comments) Blisters  . Peach [Prunus Persica] Other (See Comments)    Facial tingling     Physical examination: Blood pressure 120/72, pulse 78, temperature 97.7 F (36.5 C), temperature source Temporal, resp. rate 18, height 5\' 7"  (1.702 m), weight 179 lb 12.8 oz (81.6 kg), SpO2 98 %.  General: Alert, interactive, in no acute distress. HEENT: PERRLA, TMs pearly gray, turbinates minimally edematous without discharge, post-pharynx non erythematous. Neck: Supple without lymphadenopathy. Lungs: Clear to auscultation without wheezing, rhonchi or rales. {no increased work of breathing. CV: Normal S1, S2 without murmurs. Abdomen: Nondistended, nontender. Skin: Warm and dry, without lesions or rashes. Extremities:  No clubbing, cyanosis or edema. Neuro:   Grossly intact.  Diagnositics/Labs:  Allergy testing: environmental allergy skin prick testing is positive to grasses, weeds, trees, dust mite, cat, mouse.  Intradermal testing is positive to molds, dog, cockroach.  Select food allergy skin prick testing is negative to milk, wheat, barely, oat, rye, yeast, peach. Allergy testing results were read and interpreted by provider, documented by clinical staff.    Assessment and plan:   Anaphylaxis due to food  - continue avoidance of cherries and peach in diet  - skin testing is negative to peach, dairy and gluten based foods  - will obtain IgE to cherry (extract for skin testing   - have access to self-injectable epinephrine (Epipen or AuviQ) 0.3mg  at all times  - follow emergency action plan in case of allergic reaction  Allergic rhinitis with conjunctivitis  - environmental allergy skin testing is positive to pollens, molds, dust mites, cat, dog, cockroach  - allergen avoidance measures discussed/handouts provided  - continue Zyrtec 10mg  daily as needed for general allergy symptom relief.  Can use Xyzal or Allegra if needing daily antihistamine use.  - continue Flonase 2 sprays each nostril daily as needed for nasal congestion.  Use for 1-2 weeks at a time or sooner if needed  - for itchy/watery/red eyes can use over-the-counter Pataday 1 drop each eye daily as needed   - allergen immunotherapy discussed today including protocol, benefits and risk.  Informational handout provided.  If interested in this therapuetic option you can check with your insurance carrier for coverage.  Let us know if you would like to proceed with this option.    Pollen food allergy syndrome  - The oral allergy syndrome (OAS) or pollen-food allergy syndrome (PFAS) is a relatively common form of food allergy, particularly in adults. It typically occurs in people who have pollen allergies when the immune system "sees" proteins on the food that look like proteins on the pollen. This results in the allergy antibody (IgE) binding to the food instead of the pollen. Patients typically report itching and/or mild swelling of the mouth and throat immediately following ingestion of certain uncooked fruits (including nuts) or raw vegetables. Only a very small number of affected individuals experience systemic allergic reactions, such as anaphylaxis which occurs with true food allergies.     Follow-up 4-6 months or sooner if needed  I appreciate the opportunity to take part in Symia's care. Please do not hesitate to contact me with questions.  Sincerely,   Prudy Feeler, MD Allergy/Immunology [facility]

## 2019-05-22 NOTE — Patient Instructions (Addendum)
Reaction due food  - continue avoidance of cherries and peach in diet  - skin testing is negative to peach, dairy and gluten based foods  - will obtain IgE to cherry (extract for skin testing   - have access to self-injectable epinephrine (Epipen or AuviQ) 0.3mg  at all times  - follow emergency action plan in case of allergic reaction  Environmental allergies  - environmental allergy skin testing is positive to pollens, molds, dust mites, cat, dog, cockroach  - allergen avoidance measures discussed/handouts provided  - continue Zyrtec 10mg  daily as needed for general allergy symptom relief.  Can use Xyzal or Allegra if needing daily antihistamine use.  - continue Flonase 2 sprays each nostril daily as needed for nasal congestion.  Use for 1-2 weeks at a time or sooner if needed  - for itchy/watery/red eyes can use over-the-counter Pataday 1 drop each eye daily as needed   - allergen immunotherapy discussed today including protocol, benefits and risk.  Informational handout provided.  If interested in this therapuetic option you can check with your insurance carrier for coverage.  Let us know if you would like to proceed with this option.     Pollen food allergy syndrome  - The oral allergy syndrome (OAS) or pollen-food allergy syndrome (PFAS) is a relatively common form of food allergy, particularly in adults. It typically occurs in people who have pollen allergies when the immune system "sees" proteins on the food that look like proteins on the pollen. This results in the allergy antibody (IgE) binding to the food instead of the pollen. Patients typically report itching and/or mild swelling of the mouth and throat immediately following ingestion of certain uncooked fruits (including nuts) or raw vegetables. Only a very small number of affected individuals experience systemic allergic reactions, such as anaphylaxis which occurs with true food allergies.       Follow-up 4-6 months or sooner if  needed

## 2019-05-24 LAB — ALLERGEN, CHERRY, F242: F242-IgE Bing Cherry: 0.18 kU/L — AB

## 2019-08-26 ENCOUNTER — Other Ambulatory Visit: Payer: 59

## 2019-09-02 ENCOUNTER — Ambulatory Visit
Admission: RE | Admit: 2019-09-02 | Discharge: 2019-09-02 | Disposition: A | Discharge status: Home / Self Care | Payer: 59 | Source: Ambulatory Visit | Attending: Obstetrics and Gynecology | Admitting: Obstetrics and Gynecology

## 2019-09-02 ENCOUNTER — Other Ambulatory Visit: Payer: Self-pay

## 2019-09-02 DIAGNOSIS — Z9189 Other specified personal risk factors, not elsewhere classified: Secondary | ICD-10-CM

## 2019-09-02 MED ORDER — GADOBUTROL 1 MMOL/ML IV SOLN
7.0000 mL | Freq: Once | INTRAVENOUS | Status: AC | PRN
  Administered 2019-09-02: 15:00:00 7 mL via INTRAVENOUS

## 2019-09-20 HISTORY — PX: SKIN CANCER EXCISION: SHX779

## 2019-10-19 ENCOUNTER — Other Ambulatory Visit: Payer: Self-pay | Admitting: Family Medicine

## 2019-10-19 DIAGNOSIS — F4320 Adjustment disorder, unspecified: Secondary | ICD-10-CM

## 2019-10-21 ENCOUNTER — Telehealth: Payer: Self-pay

## 2019-10-21 NOTE — Telephone Encounter (Signed)
Please call pt to schedule appt.  No further refills until pt is seen.  T. Emmily Pellegrin, CMA  

## 2019-10-22 DIAGNOSIS — D443 Neoplasm of uncertain behavior of pituitary gland: Secondary | ICD-10-CM | POA: Diagnosis not present

## 2019-10-22 DIAGNOSIS — E119 Type 2 diabetes mellitus without complications: Secondary | ICD-10-CM | POA: Diagnosis not present

## 2019-10-22 DIAGNOSIS — E7800 Pure hypercholesterolemia, unspecified: Secondary | ICD-10-CM | POA: Diagnosis not present

## 2019-10-29 DIAGNOSIS — E119 Type 2 diabetes mellitus without complications: Secondary | ICD-10-CM | POA: Diagnosis not present

## 2019-10-29 DIAGNOSIS — I10 Essential (primary) hypertension: Secondary | ICD-10-CM | POA: Diagnosis not present

## 2019-10-29 DIAGNOSIS — E039 Hypothyroidism, unspecified: Secondary | ICD-10-CM | POA: Diagnosis not present

## 2019-10-29 DIAGNOSIS — E7800 Pure hypercholesterolemia, unspecified: Secondary | ICD-10-CM | POA: Diagnosis not present

## 2019-11-06 DIAGNOSIS — E119 Type 2 diabetes mellitus without complications: Secondary | ICD-10-CM | POA: Diagnosis not present

## 2019-11-14 ENCOUNTER — Other Ambulatory Visit: Payer: Self-pay | Admitting: Endocrinology

## 2019-11-14 ENCOUNTER — Other Ambulatory Visit: Payer: Self-pay | Admitting: Family Medicine

## 2019-11-14 DIAGNOSIS — F4320 Adjustment disorder, unspecified: Secondary | ICD-10-CM

## 2019-11-14 DIAGNOSIS — E236 Other disorders of pituitary gland: Secondary | ICD-10-CM

## 2019-11-20 ENCOUNTER — Ambulatory Visit: Payer: 59 | Admitting: Allergy

## 2019-12-01 ENCOUNTER — Ambulatory Visit
Admission: RE | Admit: 2019-12-01 | Discharge: 2019-12-01 | Disposition: A | Discharge status: Home / Self Care | Payer: 59 | Source: Ambulatory Visit | Attending: Endocrinology | Admitting: Endocrinology

## 2019-12-01 ENCOUNTER — Other Ambulatory Visit: Payer: Self-pay

## 2019-12-01 DIAGNOSIS — E236 Other disorders of pituitary gland: Secondary | ICD-10-CM

## 2019-12-01 MED ORDER — GADOBENATE DIMEGLUMINE 529 MG/ML IV SOLN
10.0000 mL | Freq: Once | INTRAVENOUS | Status: AC | PRN
  Administered 2019-12-01: 10 mL via INTRAVENOUS

## 2019-12-11 ENCOUNTER — Other Ambulatory Visit: Payer: Self-pay

## 2019-12-11 ENCOUNTER — Encounter: Payer: Self-pay | Admitting: Family Medicine

## 2019-12-11 ENCOUNTER — Ambulatory Visit (INDEPENDENT_AMBULATORY_CARE_PROVIDER_SITE_OTHER): Payer: Self-pay | Admitting: Family Medicine

## 2019-12-11 VITALS — BP 125/86 | HR 72 | Temp 98.2°F | Resp 12 | Ht 67.0 in | Wt 163.6 lb

## 2019-12-11 DIAGNOSIS — F4320 Adjustment disorder, unspecified: Secondary | ICD-10-CM

## 2019-12-11 DIAGNOSIS — E0800 Diabetes mellitus due to underlying condition with hyperosmolarity without nonketotic hyperglycemic-hyperosmolar coma (NKHHC): Secondary | ICD-10-CM

## 2019-12-11 DIAGNOSIS — E559 Vitamin D deficiency, unspecified: Secondary | ICD-10-CM

## 2019-12-11 DIAGNOSIS — E079 Disorder of thyroid, unspecified: Secondary | ICD-10-CM | POA: Insufficient documentation

## 2019-12-11 DIAGNOSIS — E663 Overweight: Secondary | ICD-10-CM

## 2019-12-11 NOTE — Progress Notes (Signed)
° ° ° ° °Impression and Recommendations:   ° °1. Diabetes mellitus due to underlying condition with hyperosmolarity without coma, without long-term current use of insulin (HCC)   °2. Overweight (BMI 25.0-29.9)   °3. Adjustment disorder, unspecified type   °4. Vitamin D deficiency   °5. Thyroid disease- management per endocrinology Dr. Balin   ° ° °- Per patient, last lab work obtained October 22, 2019.  We will need to obtain these labs from Dr Balen office.  Asked pt to have them sent to us in future and CMA to request these ° ° ° Night Sweats °-Has been being treated and monitored by her OBGYN regarding these concerns °-She will follow-up with them in the future and see if she needs an adjustment regarding her hormones. ° ° ° °Diabetes mellitus due to underlying condition with hyperosmolarity without coma, without long-term current use of insulin ° °- Followed/managed by Dr. Balan of endocrinology. ° °- Advised patient to continue treatment plan as established by endocrinology. ° °- On 10/22/2019, her A1c was 6.0, stable, at goal. ° °- Counseled patient on pathophysiology of disease and discussed various treatment options, which always includes dietary and lifestyle modification as first line.   ° °- Importance of low carb, high-protein, heart-healthy diet discussed with patient in addition to regular aerobic exercise of 30min 5d/week or more.  ° °- We will continue to monitor alongside specialist. ° ° °Hypertension associated with DM °- Blood pressure currently is stable, at goal. °- Reviewed goal blood pressure during appointment. ° °- Patient will continue current treatment regimen.  See med list. ° °- Counseled patient on pathophysiology of disease and discussed various treatment options, which always includes dietary and lifestyle modification as first line.  ° °- Lifestyle changes such as dash and heart healthy diets and engaging in a regular exercise program discussed extensively with patient.  ° °-  Ambulatory blood pressure monitoring encouraged at least 3 times weekly.  Keep log and bring in every office visit.  Reminded patient that if they ever feel poorly in any way, to check their blood pressure and pulse. ° °- Handouts provided at patient's desire and/or told to go online at the American Heart Association website for further information. ° °- We will continue to monitor. ° ° °Hyperlipidemia associated with DM °Triglycerides = 103, WNL, down from 154 last check. °LDL = 80, down from 116 last check. °HDL = 51, up from 47 last check ° °- Discussed that patient's cholesterol levels are largely at goal, with goal LDL at 70. ° °- Pt will continue current treatment regimen.  See med list. ° °- Prudent dietary changes such as low saturated & trans fat diets for hyperlipidemia and low carb diets for hypertriglyceridemia discussed with patient.   ° °- Encouraged patient to follow AHA guidelines for regular exercise and also engage in weight loss if BMI above 25.  ° °- We will continue to monitor and re-check as discussed. ° ° °Adjustment disorder, unspecified type °- Stable at this time. °- No changes to treatment plan made today. °- Continue management as established.  See med list. ° °- Reviewed the "spokes of the wheel" of mood and health management.  Stressed the importance of ongoing prudent habits, including regular exercise, appropriate sleep hygiene, healthful dietary habits, and prayer/meditation to relax. ° °- Will continue to monitor. °20 minutes ° °Vitamin D deficiency °- Need for re-check. °- Continue management as established.  See med list. ° °- Will continue   continue to monitor.   BMI Counseling - Overweight (BMI 25.0-29.9) Explained to patient what BMI refers to, and what it means medically.    Told patient to think about it as a "medical risk stratification measurement" and how increasing BMI is associated with increasing risk/ or worsening state of various diseases such as hypertension,  hyperlipidemia, diabetes, premature OA, depression etc.  American Heart Association guidelines for healthy diet, basically Mediterranean diet, and exercise guidelines of 30 minutes 5 days per week or more discussed in detail.  Health counseling performed.  All questions answered.   Health Counseling & Preventative Maintenance - Advised patient to continue working toward exercising to improve overall mental, physical, and emotional health.    - Encouraged patient to engage in daily physical activity, especially a formal exercise routine.  Recommended that the patient eventually strive for at least 150 minutes of moderate cardiovascular activity per week according to guidelines established by the [facility].   - Healthy dietary habits encouraged, including low-carb, and high amounts of lean protein in diet.   - Patient should also consume adequate amounts of water.   Please see AVS handed out to patient at the end of our visit for further patient instructions/ counseling done pertaining to today's office visit.   Return for OV in 4-6 months, and e-mail in 2 weeks with BP/pulse log.     Note:  This note was prepared with assistance of Dragon voice recognition software. Occasional wrong-word or sound-a-like substitutions may have occurred due to the inherent limitations of voice recognition software.   The [place] was signed into law in 2016 which includes the topic of electronic health records.  This provides immediate access to information in MyChart.  This includes consultation notes, operative notes, office notes, lab results and pathology reports.  If you have any questions about what you read please let us know at your next visit or call us at the office.  We are right here with you.   This case required medical decision making of at least moderate complexity.  This document serves as a record of services personally performed by Mellody Dance, DO. It was created on her  behalf by Toni Amend, a trained medical scribe. The creation of this record is based on the scribe's personal observations and the provider's statements to them.   The above documentation from Toni Amend, medical scribe, has been reviewed by Marjory Sneddon, D.O.  --------------------------------------------------------------------------------------------------------------------------------------------------------------------------------------------------------------------------------------------    Subjective:     Phillips Odor, am serving as scribe for Dr.Karla Vines.   HPI: Jessica Chang is a 49 y.o. female who presents to [place] [facility] today for issues as discussed below.  - Weight Loss Goals Notes she gained back up to 180 lbs, which was her highest weight in the past, and decided at that point to lose it.  At home, she weighs 156-158 lbs in the morning.  States that her goal was to 160, and now she wants to stay in the high 150's.  - Mood Management Says "I feel happy, wonderful."  Feels her mood is very good and well-controlled on current dose of Prozac.  She is not currently exercising.  - Elevated IgF-1 Notes her IgF was recently elevated double what it was before, and that she failed a subsequent insulin test.  Notes they did another MRI, and it came back "perfect."  States "the tumor, or whatever that was in the past, is not there, it's gone."  She's going to [facility] for further follow-up, as opposed to waiting and watching.  Patient believes that she's had all of the symptoms associated with long-term elevation of IgF-1, and wishes to have some additional questions answered by [facility].  When asked if she is stressed about these findings and follow-up, she states "yes and no."  Says "I would like to know why things are the way they are."  - Night Sweats States she is freezing during the day, and "sweaty but cold" at night.  Says she  wakes up sweating, soaking wet, her bed is wet, etc., but freezing cold.  She is unsure if this is due to pre-menopause.  HPI:   Diabetes Mellitus:  Home glucose readings:  Says her fasting sugars have been "going all over the place since last week," noting between 168, 95.   - Patient reports good compliance with therapy plan: medication and/or lifestyle modification  - Her denies acute concerns or problems related to treatment plan  - She denies new concerns.  Denies polyuria/polydipsia, hypo/ hyperglycemia symptoms.  Denies new onset of: chest pain, exercise intolerance, shortness of breath, dizziness, visual changes, headache, lower extremity swelling or claudication.   Last A1C in the office was:  Lab Results  Component Value Date   HGBA1C 7.1 10/11/2018   HGBA1C 6.9 04/02/2018   HGBA1C 6.6 (H) 06/16/2017   Lab Results  Component Value Date   MICROALBUR 10 08/31/2016   LDLCALC 116 10/11/2018   CREATININE 0.8 10/11/2018   BP Readings from Last 3 Encounters:  12/11/19 125/86  05/22/19 120/72  04/24/19 124/78   Wt Readings from Last 3 Encounters:  12/11/19 163 lb 9.6 oz (74.2 kg)  05/22/19 179 lb 12.8 oz (81.6 kg)  04/24/19 179 lb (81.2 kg)   HPI:  Hypertension:  -  Her blood pressure at home has been running: usually in the 110's/60-75.  She doesn't check her blood pressure that often; currently checks it at the office or other doctor offices.  - Patient reports good compliance with medication and/or lifestyle modification  - Her denies acute concerns or problems related to treatment plan  - She denies new onset of: chest pain, exercise intolerance, shortness of breath, dizziness, visual changes, headache, lower extremity swelling or claudication.   Last 3 blood pressure readings in our office are as follows: BP Readings from Last 3 Encounters:  12/11/19 125/86  05/22/19 120/72  04/24/19 124/78   Filed Weights   12/11/19 1532  Weight: 163 lb 9.6 oz (74.2 kg)     HPI:  Hyperlipidemia:  49 y.o. female here for cholesterol follow-up.   - Patient reports good compliance with treatment plan of:  medication and/ or lifestyle management.    - Patient denies any acute concerns or problems with management plan   - She denies new onset of: myalgias, arthralgias, increased fatigue more than normal, chest pains, exercise intolerance, shortness of breath, dizziness, visual changes, headache, lower extremity swelling or claudication.   Most recent cholesterol panel was:  Lab Results  Component Value Date   CHOL 186 10/11/2018   HDL 46 10/11/2018   LDLCALC 116 10/11/2018   TRIG 122 10/11/2018   CHOLHDL 3.9 06/16/2017   Hepatic Function Latest Ref Rng & Units 10/11/2018 06/16/2017 09/13/2016  Total Protein 6.0 - 8.5 g/dL - 7.6 7.5  Albumin 3.5 - 5.5 g/dL - 5.1 4.6  AST 13 - 35 35 54(H) 31  ALT 7 - 35 63(A) 90(H) 76(H)  Alk Phosphatase 25 -  125 65 61 56  Total Bilirubin 0.0 - 1.2 mg/dL - 0.4 0.7  Bilirubin, Direct <=0.2 mg/dL - - -     Wt Readings from Last 3 Encounters:  12/11/19 163 lb 9.6 oz (74.2 kg)  05/22/19 179 lb 12.8 oz (81.6 kg)  04/24/19 179 lb (81.2 kg)   BP Readings from Last 3 Encounters:  12/11/19 125/86  05/22/19 120/72  04/24/19 124/78   Pulse Readings from Last 3 Encounters:  12/11/19 72  05/22/19 78  04/24/19 78   BMI Readings from Last 3 Encounters:  12/11/19 25.62 kg/m  05/22/19 28.16 kg/m  04/24/19 29.33 kg/m     Patient Care Team    Relationship Specialty Notifications Start End  Mellody Dance, DO PCP - General Family Medicine  02/18/16   [place], Amy, MD Consulting Physician Dermatology  05/25/16    Comment: Murrell Redden, MD Attending Physician Orthopedic Surgery  09/22/16   Levin Erp, [place] Physician Assistant Gastroenterology  09/22/16   Jacelyn Pi, MD Consulting Physician Endocrinology  09/22/16   Marylynn Pearson, MD Consulting Physician Obstetrics and Gynecology  09/22/16    Macarthur Critchley, [place] Referring Physician Optometry  09/22/16   Darleen Crocker, MD Consulting Physician Ophthalmology  04/24/19      Patient Active Problem List   Diagnosis Date Noted   Adjustment disorder 03/06/2017   h/o Drug-induced acute pancreatitis (2nd [place] or onglyza) 05/27/2016   Hyperlipidemia associated with type 2 diabetes mellitus ([place]) 10/29/2012   Hypertension associated with diabetes ([place]) 10/29/2012   Diabetes ([place])    Thyroid disease- management per endocrinology Dr. Soyla Murphy    Diabetic cataract ([place]) 08/31/2016   Vitamin D deficiency 06/19/2017   Multiple allergies 05/18/2019   Pain in left arm 10/23/2018   Paresthesia of upper limb 10/23/2018   Right serous otitis media 03/20/2018   Acute otitis externa of right ear 03/20/2018   Elevated liver enzymes 05/27/2016   Chronic Mild Hypercalcemia 05/27/2016   Overweight (BMI 25.0-29.9) 02/21/2016   Pain, abdominal, nonspecific 02/21/2016   Skin lesion 02/21/2016   Enthesopathy of hip 10/19/2014   Cramp in muscle 08/13/2013    Past Medical history, Surgical history, Family history, Social history, Allergies and Medications have been entered into the medical record, reviewed and changed as needed.    Current Meds  Medication Sig   atorvastatin (LIPITOR) 10 MG tablet Take 10 mg by mouth daily.   cetirizine (ZYRTEC) 10 MG tablet Take 10 mg by mouth daily as needed for allergies.   Cholecalciferol (VITAMIN D3) 5000 units TABS 5,000 IU OTC vitamin D3 daily.   EPINEPHrine 0.3 mg/0.3 mL IJ SOAJ injection Inject 0.3 mLs (0.3 mg total) into the muscle as needed for anaphylaxis.   FLUoxetine (PROZAC) 40 MG capsule TAKE 1 CAPSULE BY MOUTH EVERY DAY   Ibuprofen 200 MG CAPS Take 600 mg by mouth as needed.    levonorgestrel (MIRENA) 20 MCG/24HR IUD Placed January, 2009    levothyroxine (SYNTHROID) 88 MCG tablet 1 TABLET IN THE MORNING ON AN EMPTY STOMACH ONCE A DAY ORALLY 90   lisinopril  (PRINIVIL,ZESTRIL) 20 MG tablet Take 20 mg by mouth daily.   Melatonin 5 MG TABS Take 5-10 mg by mouth daily as needed.   metFORMIN (GLUCOPHAGE-XR) 500 MG 24 hr tablet Take 2 tablets by mouth daily.    Multiple Vitamins-Minerals (HAIR/SKIN/NAILS/BIOTIN PO) Take 1 tablet by mouth daily as needed.    Allergies:  Allergies  Allergen Reactions   Januvia [Sitagliptin]  Other (See Comments)  °  Pancreatitis  °• Onglyza [Saxagliptin] Other (See Comments)  °  Pancreatitis to all DPP 4 inhibitor medications  °• Cherry Swelling and Itching  °• Lidocaine   °  Other reaction(s): Other (See Comments) °Blisters  °• Peach [Prunus Persica] Other (See Comments)  °  Facial tingling  ° ° ° °Review of Systems:  °A fourteen system review of systems was performed and found to be positive as per HPI. ° ° °Objective:   °Blood pressure 125/86, pulse 72, temperature 98.2 °F (36.8 °C), temperature source Oral, resp. rate 12, height 5' 7" (1.702 m), weight 163 lb 9.6 oz (74.2 kg), SpO2 94 %. °Body mass index is 25.62 kg/m². °General:  Well Developed, well nourished, appropriate for stated age.  °Neuro:  Alert and oriented,  extra-ocular muscles intact  °HEENT:  Normocephalic, atraumatic, neck supple, no carotid bruits appreciated  °Skin:  no gross rash, warm, pink. °Cardiac:  RRR, S1 S2 °Respiratory:  ECTA B/L and A/P, Not using accessory muscles, speaking in full sentences- unlabored. °Vascular:  Ext warm, no cyanosis apprec.; cap RF less 2 sec. °Psych:  No HI/SI, judgement and insight good, Euthymic mood. Full Affect. ° ° °

## 2019-12-11 NOTE — Patient Instructions (Addendum)
In two weeks, please e-mail [place] with a log of all of your heart rates and blood pressures.  Check your blood pressure and pulse 3-4 times per week.    Your goal blood pressure should be 135/85 or less on a regular basis, her medications should be started.  Normal blood pressure is 120/80 or less.    Hypertension Hypertension, commonly called high blood pressure, is when the force of blood pumping through the arteries is too strong. The arteries are the blood vessels that carry blood from the heart throughout the body. Hypertension forces the heart to work harder to pump blood and may cause arteries to become narrow or stiff. Having untreated or uncontrolled hypertension can cause heart attacks, strokes, kidney disease, and other problems. A blood pressure reading consists of a higher number over a lower number. Ideally, your blood pressure should be below 120/80. The first ("top") number is called the systolic pressure. It is a measure of the pressure in your arteries as your heart beats. The second ("bottom") number is called the diastolic pressure. It is a measure of the pressure in your arteries as the heart relaxes. What are the causes? The cause of this condition is not known. What increases the risk? Some risk factors for high blood pressure are under your control. Others are not. Factors you can change  Smoking.  Having type 2 diabetes mellitus, high cholesterol, or both.  Not getting enough exercise or physical activity.  Being overweight.  Having too much fat, sugar, calories, or salt (sodium) in your diet.  Drinking too much alcohol. Factors that are difficult or impossible to change  Having chronic kidney disease.  Having a family history of high blood pressure.  Age. Risk increases with age.  Race. You may be at higher risk if you are African-American.  Gender. Men are at higher risk than women before age 76. After age 66, women are at higher risk than men.  Having  obstructive sleep apnea.  Stress. What are the signs or symptoms? Extremely high blood pressure (hypertensive crisis) may cause:  Headache.  Anxiety.  Shortness of breath.  Nosebleed.  Nausea and vomiting.  Severe chest pain.  Jerky movements you cannot control (seizures).  How is this diagnosed? This condition is diagnosed by measuring your blood pressure while you are seated, with your arm resting on a surface. The cuff of the blood pressure monitor will be placed directly against the skin of your upper arm at the level of your heart. It should be measured at least twice using the same arm. Certain conditions can cause a difference in blood pressure between your right and left arms. Certain factors can cause blood pressure readings to be lower or higher than normal (elevated) for a short period of time:  When your blood pressure is higher when you are in a health care provider's office than when you are at home, this is called white coat hypertension. Most people with this condition do not need medicines.  When your blood pressure is higher at home than when you are in a health care provider's office, this is called masked hypertension. Most people with this condition may need medicines to control blood pressure.  If you have a high blood pressure reading during one visit or you have normal blood pressure with other risk factors:  You may be asked to return on a different day to have your blood pressure checked again.  You may be asked to monitor your blood pressure  at home for 1 week or longer.  If you are diagnosed with hypertension, you may have other blood or imaging tests to help your health care provider understand your overall risk for other conditions. How is this treated? This condition is treated by making healthy lifestyle changes, such as eating healthy foods, exercising more, and reducing your alcohol intake. Your health care provider may prescribe medicine if  lifestyle changes are not enough to get your blood pressure under control, and if:  Your systolic blood pressure is above 130.  Your diastolic blood pressure is above 80.  Your personal target blood pressure may vary depending on your medical conditions, your age, and other factors. Follow these instructions at home: Eating and drinking  Eat a diet that is high in fiber and potassium, and low in sodium, added sugar, and fat. An example eating plan is called the DASH (Dietary Approaches to Stop Hypertension) diet. To eat this way: ? Eat plenty of fresh fruits and vegetables. Try to fill half of your plate at each meal with fruits and vegetables. ? Eat whole grains, such as whole wheat pasta, brown rice, or whole grain bread. Fill about one quarter of your plate with whole grains. ? Eat or drink low-fat dairy products, such as skim milk or low-fat yogurt. ? Avoid fatty cuts of meat, processed or cured meats, and poultry with skin. Fill about one quarter of your plate with lean proteins, such as fish, chicken without skin, beans, eggs, and tofu. ? Avoid premade and processed foods. These tend to be higher in sodium, added sugar, and fat.  Reduce your daily sodium intake. Most people with hypertension should eat less than 1,500 mg of sodium a day.  Limit alcohol intake to no more than 1 drink a day for nonpregnant women and 2 drinks a day for men. One drink equals 12 oz of beer, 5 oz of wine, or 1 oz of hard liquor. Lifestyle  Work with your health care provider to maintain a healthy body weight or to lose weight. Ask what an ideal weight is for you.  Get at least 30 minutes of exercise that causes your heart to beat faster (aerobic exercise) most days of the week. Activities may include walking, swimming, or biking.  Include exercise to strengthen your muscles (resistance exercise), such as pilates or lifting weights, as part of your weekly exercise routine. Try to do these types of exercises  for 30 minutes at least 3 days a week.  Do not use any products that contain nicotine or tobacco, such as cigarettes and e-cigarettes. If you need help quitting, ask your health care provider.  Monitor your blood pressure at home as told by your health care provider.  Keep all follow-up visits as told by your health care provider. This is important. Medicines  Take over-the-counter and prescription medicines only as told by your health care provider. Follow directions carefully. Blood pressure medicines must be taken as prescribed.  Do not skip doses of blood pressure medicine. Doing this puts you at risk for problems and can make the medicine less effective.  Ask your health care provider about side effects or reactions to medicines that you should watch for. Contact a health care provider if:  You think you are having a reaction to a medicine you are taking.  You have headaches that keep coming back (recurring).  You feel dizzy.  You have swelling in your ankles.  You have trouble with your vision. Get help right  away if:  You develop a severe headache or confusion.  You have unusual weakness or numbness.  You feel faint.  You have severe pain in your chest or abdomen.  You vomit repeatedly.  You have trouble breathing. Summary  Hypertension is when the force of blood pumping through your arteries is too strong. If this condition is not controlled, it may put you at risk for serious complications.  Your personal target blood pressure may vary depending on your medical conditions, your age, and other factors. For most people, a normal blood pressure is less than 120/80.  Hypertension is treated with lifestyle changes, medicines, or a combination of both. Lifestyle changes include weight loss, eating a healthy, low-sodium diet, exercising more, and limiting alcohol. This information is not intended to replace advice given to you by your health care provider. Make sure you  discuss any questions you have with your health care provider. Document Released: 09/05/2005 Document Revised: 08/03/2016 Document Reviewed: 08/03/2016 Elsevier Interactive Patient Education  2018 [employer].    How to Take Your Blood Pressure   Blood pressure is a measurement of how strongly your blood is pressing against the walls of your arteries. Arteries are blood vessels that carry blood from your heart throughout your body. Your health care provider takes your blood pressure at each office visit. You can also take your own blood pressure at home with a blood pressure machine. You may need to take your own blood pressure:  To confirm a diagnosis of high blood pressure (hypertension).  To monitor your blood pressure over time.  To make sure your blood pressure medicine is working.  Supplies needed: To take your blood pressure, you will need a blood pressure machine. You can buy a blood pressure machine, or blood pressure monitor, at most drugstores or online. There are several types of home blood pressure monitors. When choosing one, consider the following:  Choose a monitor that has an arm cuff.  Choose a monitor that wraps snugly around your upper arm. You should be able to fit only one finger between your arm and the cuff.  Do not choose a monitor that measures your blood pressure from your wrist or finger.  Your health care provider can suggest a reliable monitor that will meet your needs. How to prepare To get the most accurate reading, avoid the following for 30 minutes before you check your blood pressure:  Drinking caffeine.  Drinking alcohol.  Eating.  Smoking.  Exercising.  Five minutes before you check your blood pressure:  Empty your bladder.  Sit quietly without talking in a dining chair, rather than in a soft couch or armchair.  How to take your blood pressure To check your blood pressure, follow the instructions in the manual that came with your  blood pressure monitor. If you have a digital blood pressure monitor, the instructions may be as follows: 1. Sit up straight. 2. Place your feet on the floor. Do not cross your ankles or legs. 3. Rest your left arm at the level of your heart on a table or desk or on the arm of a chair. 4. Pull up your shirt sleeve. 5. Wrap the blood pressure cuff around the upper part of your left arm, 1 inch (2.5 cm) above your elbow. It is best to wrap the cuff around bare skin. 6. Fit the cuff snugly around your arm. You should be able to place only one finger between the cuff and your arm. 7. Position the  cord inside the groove of your elbow. 8. Press the power button. 9. Sit quietly while the cuff inflates and deflates. 10. Read the digital reading on the monitor screen and write it down (record it). 11. Wait 2-3 minutes, then repeat the steps, starting at step 1.  What does my blood pressure reading mean? A blood pressure reading consists of a higher number over a lower number. Ideally, your blood pressure should be below 120/80. The first ("top") number is called the systolic pressure. It is a measure of the pressure in your arteries as your heart beats. The second ("bottom") number is called the diastolic pressure. It is a measure of the pressure in your arteries as the heart relaxes. Blood pressure is classified into four stages. The following are the stages for adults who do not have a short-term serious illness or a chronic condition. Systolic pressure and diastolic pressure are measured in a unit called mm Hg. Normal  Systolic pressure: below 123456.  Diastolic pressure: below 80. Elevated  Systolic pressure: Q000111Q.  Diastolic pressure: below 80. Hypertension stage 1  Systolic pressure: 0000000.  Diastolic pressure: [id number]. Hypertension stage 2  Systolic pressure: XX123456 or above.  Diastolic pressure: 90 or above. You can have prehypertension or hypertension even if only the systolic or  only the diastolic number in your reading is higher than normal. Follow these instructions at home:  Check your blood pressure as often as recommended by your health care provider.  Take your monitor to the next appointment with your health care provider to make sure: ? That you are using it correctly. ? That it provides accurate readings.  Be sure you understand what your goal blood pressure numbers are.  Tell your health care provider if you are having any side effects from blood pressure medicine. Contact a health care provider if:  Your blood pressure is consistently high. Get help right away if:  Your systolic blood pressure is higher than 180.  Your diastolic blood pressure is higher than 110. This information is not intended to replace advice given to you by your health care provider. Make sure you discuss any questions you have with your health care provider. Document Released: 02/12/2016 Document Revised: 04/26/2016 Document Reviewed: 02/12/2016 Elsevier Interactive Patient Education  [employer].

## 2019-12-23 DIAGNOSIS — G5621 Lesion of ulnar nerve, right upper limb: Secondary | ICD-10-CM | POA: Diagnosis not present

## 2019-12-23 DIAGNOSIS — E119 Type 2 diabetes mellitus without complications: Secondary | ICD-10-CM | POA: Diagnosis not present

## 2019-12-23 DIAGNOSIS — M5412 Radiculopathy, cervical region: Secondary | ICD-10-CM | POA: Diagnosis not present

## 2019-12-23 DIAGNOSIS — G959 Disease of spinal cord, unspecified: Secondary | ICD-10-CM | POA: Diagnosis not present

## 2019-12-23 DIAGNOSIS — M7710 Lateral epicondylitis, unspecified elbow: Secondary | ICD-10-CM | POA: Diagnosis not present

## 2020-01-29 ENCOUNTER — Other Ambulatory Visit: Payer: Self-pay

## 2020-01-29 ENCOUNTER — Ambulatory Visit (INDEPENDENT_AMBULATORY_CARE_PROVIDER_SITE_OTHER): Payer: BC Managed Care – PPO | Admitting: Neurology

## 2020-01-29 ENCOUNTER — Encounter: Payer: BC Managed Care – PPO | Admitting: Neurology

## 2020-01-29 DIAGNOSIS — R202 Paresthesia of skin: Secondary | ICD-10-CM | POA: Insufficient documentation

## 2020-01-29 DIAGNOSIS — Z0289 Encounter for other administrative examinations: Secondary | ICD-10-CM

## 2020-01-29 NOTE — Procedures (Signed)
Full Name: Jessica Chang Gender: Female MRN #: [id number] Date of Birth: [birth year]    Visit Date: 01/29/2020 08:44 Age: 49 Years Examining Physician: Marcial Pacas, MD  Referring Physician: Erline Levine, MD Height: 5 feet 7 inch History: 49 year old right-handed female, with history of cervical decompression surgery, presented with recurrent right elbow hypersensitivity, deep achy pain, right arm weakness, intermittent right 3rd-5th finger paresthesia, especially at nighttime. On examination: She has mild right shoulder abduction, external rotation, pronation weakness.  Sensory examination was intact to light touch, bilateral upper extremity deep tendon reflexes was normal and symmetric  Summary of the tests:  Nerve conduction study:  Bilateral ulnar sensory responses were normal.  Right ulnar motor responses were normal.  Right median sensory and motor responses were normal.  Electromyography: Selected needle examination of bilateral upper extremity muscles and bilateral cervical paraspinal muscles were normal.  Conclusion: This is a normal study.  There is no electrodiagnostic evidence of right ulnar neuropathy, right cervical radiculopathy.    ------------------------------- Marcial Pacas, M.D. PhD  [facility] [address] [place], [place] [address] Tel: [phone] Fax: [phone]  Verbal informed consent was obtained from the patient, patient was informed of potential risk of procedure, including bruising, bleeding, hematoma formation, infection, muscle weakness, muscle pain, numbness, among others.        [place]    Nerve / Sites Muscle Latency Ref. Amplitude Ref. Rel Amp Segments Distance Velocity Ref. Area    ms ms mV mV %  cm m/s m/s mVms  R Median - APB     Wrist APB 3.5 ?4.4 10.2 ?4.0 100 Wrist - APB 7   37.3     Upper arm APB 7.4  9.5  93.3 Upper arm - Wrist 21 53 ?49 33.8  R Ulnar - ADM     Wrist ADM 2.5 ?3.3 11.2 ?6.0 100  Wrist - ADM 7   25.4     B.Elbow ADM 6.2  9.6  85.7 B.Elbow - Wrist 20 55 ?49 24.0     A.Elbow ADM 8.1  9.0  93.6 A.Elbow - B.Elbow 10 51 ?49 23.9         SNC    Nerve / Sites Rec. Site Peak Lat Ref.  Amp Ref. Segments Distance Peak Diff Ref.    ms ms V V  cm ms ms  R Median, Ulnar - Transcarpal comparison     Median Palm Wrist 2.2 ?2.2 82 ?35 Median Palm - Wrist 8       Ulnar Palm Wrist 2.0 ?2.2 35 ?12 Ulnar Palm - Wrist 8          Median Palm - Ulnar Palm  0.2 ?0.4  R Median - Orthodromic (Dig II, Mid palm)     Dig II Wrist 3.3 ?3.4 13 ?10 Dig II - Wrist 13    R Ulnar - Orthodromic, (Dig V, Mid palm)     Dig V Wrist 2.8 ?3.1 8 ?5 Dig V - Wrist 11    L Ulnar - Orthodromic, (Dig V, Mid palm)     Dig V Wrist 2.8 ?3.1 13 ?5 Dig V - Wrist 53               F  Wave    Nerve F Lat Ref.   ms ms  R Ulnar - ADM 29.0 ?32.0       EMG Summary Table    Spontaneous MUAP Recruitment  Muscle IA Fib  PSW Fasc Other Amp Dur. Poly Pattern  R. First dorsal interosseous Normal None None None _______ Normal Normal Normal Normal  R. Pronator teres Normal None None None _______ Normal Normal Normal Normal  R. Biceps brachii Normal None None None _______ Normal Normal Normal Normal  R. Deltoid Normal None None None _______ Normal Normal Normal Normal  R. Triceps brachii Normal None None None _______ Normal Normal Normal Normal  L. First dorsal interosseous Normal None None None _______ Normal Normal Normal Normal  L. Pronator teres Normal None None None _______ Normal Normal Normal Normal  L. Biceps brachii Normal None None None _______ Normal Normal Normal Normal  L. Deltoid Normal None None None _______ Normal Normal Normal Normal  L. Triceps brachii Normal None None None _______ Normal Normal Normal Normal  L. Cervical paraspinals Normal None None None _______ Normal Normal Normal Normal  R. Cervical paraspinals Normal None None None _______ Normal Normal Normal Normal

## 2020-02-11 DIAGNOSIS — C44311 Basal cell carcinoma of skin of nose: Secondary | ICD-10-CM | POA: Diagnosis not present

## 2020-02-11 DIAGNOSIS — L720 Epidermal cyst: Secondary | ICD-10-CM | POA: Diagnosis not present

## 2020-02-11 DIAGNOSIS — C4401 Basal cell carcinoma of skin of lip: Secondary | ICD-10-CM | POA: Diagnosis not present

## 2020-02-11 DIAGNOSIS — Z85828 Personal history of other malignant neoplasm of skin: Secondary | ICD-10-CM | POA: Diagnosis not present

## 2020-02-11 DIAGNOSIS — C44319 Basal cell carcinoma of skin of other parts of face: Secondary | ICD-10-CM | POA: Diagnosis not present

## 2020-03-04 DIAGNOSIS — C44311 Basal cell carcinoma of skin of nose: Secondary | ICD-10-CM | POA: Diagnosis not present

## 2020-03-04 DIAGNOSIS — C4401 Basal cell carcinoma of skin of lip: Secondary | ICD-10-CM | POA: Diagnosis not present

## 2020-03-12 ENCOUNTER — Other Ambulatory Visit: Payer: Self-pay | Admitting: Family Medicine

## 2020-03-12 DIAGNOSIS — F4320 Adjustment disorder, unspecified: Secondary | ICD-10-CM

## 2020-03-17 DIAGNOSIS — Z6826 Body mass index (BMI) 26.0-26.9, adult: Secondary | ICD-10-CM | POA: Diagnosis not present

## 2020-03-17 DIAGNOSIS — Z01419 Encounter for gynecological examination (general) (routine) without abnormal findings: Secondary | ICD-10-CM | POA: Diagnosis not present

## 2020-03-17 DIAGNOSIS — E7800 Pure hypercholesterolemia, unspecified: Secondary | ICD-10-CM | POA: Insufficient documentation

## 2020-03-17 DIAGNOSIS — Z1231 Encounter for screening mammogram for malignant neoplasm of breast: Secondary | ICD-10-CM | POA: Diagnosis not present

## 2020-03-17 DIAGNOSIS — Z1211 Encounter for screening for malignant neoplasm of colon: Secondary | ICD-10-CM | POA: Diagnosis not present

## 2020-03-17 DIAGNOSIS — C4490 Unspecified malignant neoplasm of skin, unspecified: Secondary | ICD-10-CM | POA: Insufficient documentation

## 2020-03-18 ENCOUNTER — Other Ambulatory Visit: Payer: Self-pay | Admitting: Physician Assistant

## 2020-03-18 DIAGNOSIS — F4320 Adjustment disorder, unspecified: Secondary | ICD-10-CM

## 2020-03-18 MED ORDER — FLUOXETINE HCL 40 MG PO CAPS: 40.0000 mg | ORAL_CAPSULE | Freq: Every day | ORAL | 0 refills | Status: DC

## 2020-03-18 NOTE — Telephone Encounter (Signed)
Patient called request refill on :   FLUoxetine (PROZAC) 40 MG capsule [[id number]]   Order Details Dose, Route, Frequency: As Directed  Dispense Quantity: 90 capsule Refills: 0       Sig: TAKE 1 CAPSULE BY MOUTH EVERY DAY       ---Forwarding request to med asst that ( Pt's LOV  12/11/19 w/ Dr.Opalski.   --If refill approved send order to :   [id number] [address], [place]  [place], [place] [address]  Phone:  [phone] Fax:  [phone]   --glh

## 2020-03-18 NOTE — Telephone Encounter (Signed)
Refill sent to pharmacy. AS, CMA 

## 2020-03-24 ENCOUNTER — Encounter: Payer: Self-pay | Admitting: Gastroenterology

## 2020-04-13 ENCOUNTER — Other Ambulatory Visit: Payer: Self-pay

## 2020-04-13 ENCOUNTER — Ambulatory Visit (AMBULATORY_SURGERY_CENTER): Payer: Self-pay

## 2020-04-13 VITALS — Ht 67.0 in | Wt 171.0 lb

## 2020-04-13 DIAGNOSIS — Z1211 Encounter for screening for malignant neoplasm of colon: Secondary | ICD-10-CM

## 2020-04-13 MED ORDER — SUTAB 1479-225-188 MG PO TABS: 1.0000 | ORAL_TABLET | ORAL | 0 refills | Status: DC

## 2020-04-13 NOTE — Progress Notes (Signed)
No egg or soy allergy known to patient  No issues with past sedation with any surgeries or procedures No intubation problems in the past  No diet pills per patient No home 02 use per patient  No blood thinners per patient  Pt reports issues with constipation- patient reports she would like to do Miralax daily for 5 days prior to procedure and then twice daily the day before and the day of the procedure; patient advised of the importance of having "clear/yellow liquid" bowel movements and verbalized understanding of need to have clean colon prior to procedure;   No A fib or A flutter  EMMI video to pt or MyChart  COVID 19 guidelines implemented in [facility] today   Coupon given to pt in [facility] today  COVID vaccines completed on 10/2019 per pt; Due to the COVID-19 pandemic we are asking patients to follow these guidelines. Please only bring one care partner. Please be aware that your care partner may wait in the car in the parking lot or if they feel like they will be too hot to wait in the car, they may wait in the lobby on the 4th floor. All care partners are required to wear a mask the entire time (we do not have any that we can provide them), they need to practice social distancing, and we will do a Covid check for all patient's and care partners when you arrive. Also we will check their temperature and your temperature. If the care partner waits in their car they need to stay in the parking lot the entire time and we will call them on their cell phone when the patient is ready for discharge so they can bring the car to the front of the building. Also all patient's will need to wear a mask into building.

## 2020-04-15 ENCOUNTER — Encounter: Payer: Self-pay | Admitting: Gastroenterology

## 2020-04-20 DIAGNOSIS — E119 Type 2 diabetes mellitus without complications: Secondary | ICD-10-CM | POA: Diagnosis not present

## 2020-04-20 DIAGNOSIS — D443 Neoplasm of uncertain behavior of pituitary gland: Secondary | ICD-10-CM | POA: Diagnosis not present

## 2020-04-20 DIAGNOSIS — E7800 Pure hypercholesterolemia, unspecified: Secondary | ICD-10-CM | POA: Diagnosis not present

## 2020-04-24 DIAGNOSIS — B342 Coronavirus infection, unspecified: Secondary | ICD-10-CM | POA: Diagnosis not present

## 2020-04-24 DIAGNOSIS — U071 COVID-19: Secondary | ICD-10-CM | POA: Diagnosis not present

## 2020-04-25 ENCOUNTER — Other Ambulatory Visit: Payer: Self-pay | Admitting: Physician Assistant

## 2020-04-25 DIAGNOSIS — F4320 Adjustment disorder, unspecified: Secondary | ICD-10-CM

## 2020-04-30 ENCOUNTER — Encounter: Payer: Self-pay | Admitting: Gastroenterology

## 2020-06-09 ENCOUNTER — Telehealth: Payer: Self-pay | Admitting: Neurology

## 2020-06-10 NOTE — Telephone Encounter (Signed)
Not seen in clinic since 2018. I think this may go to pre-visit.

## 2020-06-11 ENCOUNTER — Other Ambulatory Visit: Payer: Self-pay

## 2020-06-11 ENCOUNTER — Ambulatory Visit: Payer: BC Managed Care – PPO | Admitting: Physician Assistant

## 2020-06-11 ENCOUNTER — Encounter: Payer: Self-pay | Admitting: Physician Assistant

## 2020-06-11 VITALS — BP 106/68 | HR 69 | Ht 67.0 in | Wt 172.4 lb

## 2020-06-11 DIAGNOSIS — E1169 Type 2 diabetes mellitus with other specified complication: Secondary | ICD-10-CM | POA: Diagnosis not present

## 2020-06-11 DIAGNOSIS — E1159 Type 2 diabetes mellitus with other circulatory complications: Secondary | ICD-10-CM

## 2020-06-11 DIAGNOSIS — F4320 Adjustment disorder, unspecified: Secondary | ICD-10-CM

## 2020-06-11 DIAGNOSIS — E785 Hyperlipidemia, unspecified: Secondary | ICD-10-CM

## 2020-06-11 DIAGNOSIS — I10 Essential (primary) hypertension: Secondary | ICD-10-CM

## 2020-06-11 DIAGNOSIS — Z8616 Personal history of COVID-19: Secondary | ICD-10-CM

## 2020-06-11 DIAGNOSIS — E0800 Diabetes mellitus due to underlying condition with hyperosmolarity without nonketotic hyperglycemic-hyperosmolar coma (NKHHC): Secondary | ICD-10-CM

## 2020-06-11 DIAGNOSIS — R6889 Other general symptoms and signs: Secondary | ICD-10-CM

## 2020-06-11 DIAGNOSIS — E079 Disorder of thyroid, unspecified: Secondary | ICD-10-CM

## 2020-06-11 LAB — POCT GLYCOSYLATED HEMOGLOBIN (HGB A1C): Hemoglobin A1C: 6.4 % — AB (ref 4.0–5.6)

## 2020-06-11 NOTE — Progress Notes (Signed)
Established Patient Office Visit  Subjective:  Patient ID: Jessica Chang, female    DOB: 07-04-1971  Age: 49 y.o. MRN: [id number]  CC:  Chief Complaint  Patient presents with  . Hypertension  . Thyroid Problem  . Hyperlipidemia  . Diabetes    HPI Jessica Chang presents for follow up on hypertension, hyperlipidemia, diabetes mellitus, and thyroid. Patient reports a history of Covid-19 infection in August and continues to recover. Her fatigue and exercise tolerance is gradually improving. Pt also brings a copy of most recent labs done at [facility]- Dr. Chalmers Cater.  HTN: Pt denies new onset of chest pain, palpitations, dizziness or lower extremity swelling. Taking medication as directed without side effects. Checks BP at home and readings range in 100s/60s. Pt follows a low salt diet.   HLD: Pt taking medication as directed without issues. Denies side effects including myalgias and RUQ pain.   Diabetes melitus: Followed by Endocrinology. Pt denies increased urination or thirst. Pt reports medication compliance. No hypoglycemic events.   Thyroid: Reports feeling cold all the time for several months and doesn't think it is her thyroid. Followed by Endocrinology. Reports medication compliance. Reports history of anemia (low iron).  Past Medical History:  Diagnosis Date  . Acute pancreatitis   . Allergy    seasonal   . Arthritis    2021 right hip  . Cataract    2020  . Diabetes ([place])   . Gallstones   . Hyperlipidemia   . Hypertension   . Pneumonia   . Squamous cell carcinoma    skin-sx on 2021  . Thyroid disease    2016    Past Surgical History:  Procedure Laterality Date  . CATARACT EXTRACTION  2020  . CERVICAL SPINE SURGERY    . CESAREAN SECTION  2008  . CHOLECYSTECTOMY  2005  . HIP ARTHROSCOPY Right 2011, 2016  . KNEE ARTHROSCOPY Right 1988  . SKIN CANCER EXCISION  2021  . TONSILECTOMY, ADENOIDECTOMY, BILATERAL MYRINGOTOMY AND TUBES  1989  .  TONSILLECTOMY  1989  . WISDOM TOOTH EXTRACTION  1990    Family History  Problem Relation Age of Onset  . Heart disease Mother   . Basal cell carcinoma Mother   . Diabetes Father   . Hyperlipidemia Father   . Hypertension Father   . Colon polyps Father 31  . Prostate cancer Father 40  . Melanoma Father   . Aneurysm Father   . AAA (abdominal aortic aneurysm) Father 30       Thoracic aorta repair and AVR  . Alcohol abuse Sister   . Mental illness Sister   . Asthma Sister   . Urticaria Sister   . Allergic rhinitis Sister   . Heart attack Paternal Aunt   . Melanoma Paternal Aunt   . Breast cancer Maternal Grandmother   . AAA (abdominal aortic aneurysm) Maternal Grandfather   . Diabetes Paternal Grandmother   . Stroke Paternal Grandfather   . Melanoma Paternal Grandfather   . Colon cancer Neg Hx   . Esophageal cancer Neg Hx   . Rectal cancer Neg Hx   . Stomach cancer Neg Hx     Social History   Socioeconomic History  . Marital status: Married    Spouse name: Not on file  . Number of children: 1  . Years of education: Not on file  . Highest education level: Not on file  Occupational History  . Occupation: physical therapy  Tobacco Use  . Smoking  status: Former Smoker    Packs/day: 0.25    Years: 5.00    Pack years: 1.25    Quit date: 09/19/2005    Years since quitting: 14.7  . Smokeless tobacco: Never Used  . Tobacco comment: Quit 2007  Vaping Use  . Vaping Use: Never used  Substance and Sexual Activity  . Alcohol use: Yes    Comment: occassionally  . Drug use: No  . Sexual activity: Yes    Birth control/protection: I.U.D.  Other Topics Concern  . Not on file  Social History Narrative  . Not on file   Social Determinants of Health   Financial Resource Strain:   . Difficulty of Paying Living Expenses: Not on file  Food Insecurity:   . Worried About Charity fundraiser in the Last Year: Not on file  . Ran Out of Food in the Last Year: Not on file   Transportation Needs:   . Lack of Transportation (Medical): Not on file  . Lack of Transportation (Non-Medical): Not on file  Physical Activity:   . Days of Exercise per Week: Not on file  . Minutes of Exercise per Session: Not on file  Stress:   . Feeling of Stress : Not on file  Social Connections:   . Frequency of Communication with Friends and Family: Not on file  . Frequency of Social Gatherings with Friends and Family: Not on file  . Attends Religious Services: Not on file  . Active Member of Clubs or Organizations: Not on file  . Attends Archivist Meetings: Not on file  . Marital Status: Not on file  Intimate Partner Violence:   . Fear of Current or Ex-Partner: Not on file  . Emotionally Abused: Not on file  . Physically Abused: Not on file  . Sexually Abused: Not on file    Outpatient Medications Prior to Visit  Medication Sig Dispense Refill  . atorvastatin (LIPITOR) 10 MG tablet Take 10 mg by mouth daily.    . cetirizine (ZYRTEC) 10 MG tablet Take 10 mg by mouth daily as needed for allergies.    . Cholecalciferol (VITAMIN D3) 5000 units TABS 5,000 IU OTC vitamin D3 daily. 90 tablet 3  . EPINEPHrine 0.3 mg/0.3 mL IJ SOAJ injection Inject 0.3 mLs (0.3 mg total) into the muscle as needed for anaphylaxis. 2 each 1  . FLUoxetine (PROZAC) 40 MG capsule Take 1 capsule (40 mg total) by mouth daily. 90 capsule 0  . glucose blood test strip Check 2 hour postprandial and fasting blood sugars daily; also if you feel poorly 100 each 12  . Ibuprofen 200 MG CAPS Take 600 mg by mouth as needed.     Marland Kitchen levonorgestrel (MIRENA) 20 MCG/24HR IUD Placed January, 2009     . levothyroxine (SYNTHROID) 88 MCG tablet 1 TABLET IN THE MORNING ON AN EMPTY STOMACH ONCE A DAY ORALLY 90    . lisinopril (PRINIVIL,ZESTRIL) 20 MG tablet Take 20 mg by mouth daily.  6  . Melatonin 5 MG TABS Take 5-10 mg by mouth daily as needed.    . metFORMIN (GLUCOPHAGE-XR) 500 MG 24 hr tablet Take 1 tablet by  mouth in the morning and at bedtime.     . Multiple Vitamins-Minerals (HAIR/SKIN/NAILS/BIOTIN PO) Take 1 tablet by mouth daily as needed.    . Polyethylene Glycol 3350 (MIRALAX PO) Take by mouth.    . Sodium Sulfate-Mag Sulfate-KCl (SUTAB) [phone] MG TABS Take 1 kit by mouth as directed. 24 tablet  0   No facility-administered medications prior to visit.    Allergies  Allergen Reactions  . Januvia [Sitagliptin] Other (See Comments)    Pancreatitis  . Onglyza [Saxagliptin] Other (See Comments)    Pancreatitis to all DPP 4 inhibitor medications  . Cherry Swelling and Itching  . Lidocaine     Other reaction(s): Other (See Comments) Blisters  . Peach [Prunus Persica] Other (See Comments)    Facial tingling    ROS Review of Systems  A fourteen system review of systems was performed and found to be positive as per HPI.  Objective:    Physical Exam General:  Well Developed, well nourished, appropriate for stated age.  Neuro:  Alert and oriented,  extra-ocular muscles intact, no focal deficits  HEENT:  Normocephalic, atraumatic Skin:  no gross rash, warm, pink. Cardiac:  RRR, S1 S2 Respiratory:  ECTA B/L and A/P, Not using accessory muscles, speaking in full sentences- unlabored. Vascular:  Ext warm, no cyanosis apprec.; no gross edema. Psych:  No HI/SI, judgement and insight good, Euthymic mood. Full Affect.   BP 106/68   Pulse 69   Ht _0  (1.702 m)   Wt 172 lb 6.4 oz (78.2 kg)   SpO2 98%   BMI 27.00 kg/m  Wt Readings from Last 3 Encounters:  06/16/20 171 lb (77.6 kg)  06/11/20 172 lb 6.4 oz (78.2 kg)  04/13/20 171 lb (77.6 kg)     Health Maintenance Due  Topic Date Due  . Hepatitis C Screening  Never done  . COVID-19 Vaccine (1) Never done  . PAP SMEAR-Modifier  07/01/2017  . TETANUS/TDAP  09/19/2017  . FOOT EXAM  06/19/2018  . OPHTHALMOLOGY EXAM  06/27/2019  . INFLUENZA VACCINE  Never done    There are no preventive care reminders to display for this  patient.  Lab Results  Component Value Date   TSH 1.75 12/18/2018   Lab Results  Component Value Date   WBC 7.7 06/11/2020   HGB 13.1 06/11/2020   HCT 38.3 06/11/2020   MCV 89 06/11/2020   PLT 327 06/11/2020   Lab Results  Component Value Date   NA 139 10/11/2018   K 5.0 10/11/2018   CO2 20 06/16/2017   GLUCOSE 110 (H) 06/16/2017   BUN 15 10/11/2018   CREATININE 0.8 10/11/2018   BILITOT 0.4 06/16/2017   ALKPHOS 65 10/11/2018   AST 35 10/11/2018   ALT 63 (A) 10/11/2018   PROT 7.6 06/16/2017   ALBUMIN 5.1 06/16/2017   CALCIUM 10.0 06/16/2017   ANIONGAP 10 09/13/2016   Lab Results  Component Value Date   CHOL 186 10/11/2018   Lab Results  Component Value Date   HDL 46 10/11/2018   Lab Results  Component Value Date   LDLCALC 116 10/11/2018   Lab Results  Component Value Date   TRIG 122 10/11/2018   Lab Results  Component Value Date   CHOLHDL 3.9 06/16/2017   Lab Results  Component Value Date   HGBA1C 6.4 (A) 06/11/2020      Assessment & Plan:   Problem List Items Addressed This Visit      Cardiovascular and Mediastinum   Hypertension associated with diabetes ([place]) (Chronic)     Endocrine   Diabetes ([place]) - Primary (Chronic)   Relevant Orders   POCT glycosylated hemoglobin (Hb A1C) (Completed)   Thyroid disease- management per endocrinology Dr. Soyla Murphy (Chronic)   Hyperlipidemia associated with type 2 diabetes mellitus ([place])     Other  Adjustment disorder    Other Visit Diagnoses    Cold intolerance       Relevant Orders   CBC (Completed)   Iron, TIBC and Ferritin Panel (Completed)     Diabetes mellitus: -A1c stable -Continue to follow-up with endocrinology. -Continue current medication regimen. -Follow a low carbohydrate and glucose diet.  Hypertension associated with diabetes mellitus: -BP at goal -Followed by Endocrinology. -Continue lisinopril. -Continue low-sodium diet.  Hyperlipidemia associated with type II diabetes  mellitus: -Most recent lipid panel within normal limits. Followed by Endocrinology. -Continue Lipitor.  -Follow a heart healthy diet. -Will continue to monitor.  Thyroid disease: -Followed by endocrinology. -Most recent TSH WNL -Continue current medication regimen.  Adjustment disorder:  -Stable, PHQ-9 score of 2 -Continue Prozac. -Will continue to monitor  Cold intolerance: -Most recent thyroid labs WNL so less likely thyroid related. Patient reports a history of low iron so will collect a CBC and iron panel.  History of Covid-19: -Continues to recover and doing better.   No orders of the defined types were placed in this encounter.   Follow-up: Return in about 6 months (around 12/09/2020) for DM, HTN, HLD, hypothyroid .   Note:  This note was prepared with assistance of Dragon voice recognition software. Occasional wrong-word or sound-a-like substitutions may have occurred due to the inherent limitations of voice recognition software.  Lorrene Reid, PA-C

## 2020-06-12 LAB — IRON,TIBC AND FERRITIN PANEL
Ferritin: 41 ng/mL (ref 15–150)
Iron Saturation: 20 % (ref 15–55)
Iron: 76 ug/dL (ref 27–159)
Total Iron Binding Capacity: 389 ug/dL (ref 250–450)
UIBC: 313 ug/dL (ref 131–425)

## 2020-06-12 LAB — CBC
Hematocrit: 38.3 % (ref 34.0–46.6)
Hemoglobin: 13.1 g/dL (ref 11.1–15.9)
MCH: 30.3 pg (ref 26.6–33.0)
MCHC: 34.2 g/dL (ref 31.5–35.7)
MCV: 89 fL (ref 79–97)
Platelets: 327 10*3/uL (ref 150–450)
RBC: 4.32 x10E6/uL (ref 3.77–5.28)
RDW: 12.3 % (ref 11.7–15.4)
WBC: 7.7 10*3/uL (ref 3.4–10.8)

## 2020-06-15 DIAGNOSIS — E039 Hypothyroidism, unspecified: Secondary | ICD-10-CM | POA: Diagnosis not present

## 2020-06-15 DIAGNOSIS — I10 Essential (primary) hypertension: Secondary | ICD-10-CM | POA: Diagnosis not present

## 2020-06-15 DIAGNOSIS — Z23 Encounter for immunization: Secondary | ICD-10-CM | POA: Diagnosis not present

## 2020-06-15 DIAGNOSIS — E119 Type 2 diabetes mellitus without complications: Secondary | ICD-10-CM | POA: Diagnosis not present

## 2020-06-15 DIAGNOSIS — E228 Other hyperfunction of pituitary gland: Secondary | ICD-10-CM | POA: Diagnosis not present

## 2020-06-15 NOTE — Telephone Encounter (Signed)
Pt is requesting a call back regarding her procedure scheduled for tomorrow 9/28.

## 2020-06-15 NOTE — Telephone Encounter (Signed)
Patient called, answered questions and went over prep times with the patient.

## 2020-06-16 ENCOUNTER — Other Ambulatory Visit: Payer: Self-pay

## 2020-06-16 ENCOUNTER — Ambulatory Visit (AMBULATORY_SURGERY_CENTER): Payer: BC Managed Care – PPO | Admitting: Gastroenterology

## 2020-06-16 ENCOUNTER — Other Ambulatory Visit: Payer: Self-pay | Admitting: Gastroenterology

## 2020-06-16 ENCOUNTER — Encounter: Payer: Self-pay | Admitting: Gastroenterology

## 2020-06-16 VITALS — BP 119/78 | HR 66 | Temp 97.3°F | Resp 12 | Ht 67.0 in | Wt 171.0 lb

## 2020-06-16 DIAGNOSIS — Z1211 Encounter for screening for malignant neoplasm of colon: Secondary | ICD-10-CM | POA: Diagnosis not present

## 2020-06-16 DIAGNOSIS — D123 Benign neoplasm of transverse colon: Secondary | ICD-10-CM | POA: Diagnosis not present

## 2020-06-16 MED ORDER — SODIUM CHLORIDE 0.9 % IV SOLN: 500.0000 mL | Freq: Once | INTRAVENOUS | Status: DC

## 2020-06-16 NOTE — Patient Instructions (Signed)
Thank you for letting us take care of your healthcare needs today. ?Please see handouts given to you on Polyps, Diverticulosis and Hemorrhoids. ? ? ? ?YOU HAD AN ENDOSCOPIC PROCEDURE TODAY AT THE [facility]:   Refer to the procedure report that was given to you for any specific questions about what was found during the examination.  If the procedure report does not answer your questions, please call your gastroenterologist to clarify.  If you requested that your care partner not be given the details of your procedure findings, then the procedure report has been included in a sealed envelope for you to review at your convenience later. ? ?YOU SHOULD EXPECT: Some feelings of bloating in the abdomen. Passage of more gas than usual.  Walking can help get rid of the air that was put into your GI tract during the procedure and reduce the bloating. If you had a lower endoscopy (such as a colonoscopy or flexible sigmoidoscopy) you may notice spotting of blood in your stool or on the toilet paper. If you underwent a bowel prep for your procedure, you may not have a normal bowel movement for a few days. ? ?Please Note:  You might notice some irritation and congestion in your nose or some drainage.  This is from the oxygen used during your procedure.  There is no need for concern and it should clear up in a day or so. ? ?SYMPTOMS TO REPORT IMMEDIATELY: ? ?Following lower endoscopy (colonoscopy or flexible sigmoidoscopy): ? Excessive amounts of blood in the stool ? Significant tenderness or worsening of abdominal pains ? Swelling of the abdomen that is new, acute ? Fever of 100?F or higher ? ? ?For urgent or emergent issues, a gastroenterologist can be reached at any hour by calling [phone]. ?Do not use MyChart messaging for urgent concerns.  ? ? ?DIET:  We do recommend a small meal at first, but then you may proceed to your regular diet.  Drink plenty of fluids but you should avoid alcoholic beverages for  24 hours. ? ?ACTIVITY:  You should plan to take it easy for the rest of today and you should NOT DRIVE or use heavy machinery until tomorrow (because of the sedation medicines used during the test).   ? ?FOLLOW UP: ?Our staff will call the number listed on your records 48-72 hours following your procedure to check on you and address any questions or concerns that you may have regarding the information given to you following your procedure. If we do not reach you, we will leave a message.  We will attempt to reach you two times.  During this call, we will ask if you have developed any symptoms of COVID 19. If you develop any symptoms (ie: fever, flu-like symptoms, shortness of breath, cough etc.) before then, please call [phone].  If you test positive for Covid 19 in the 2 weeks post procedure, please call and report this information to us.   ? ?If any biopsies were taken you will be contacted by phone or by letter within the next 1-3 weeks.  Please call us at [phone] if you have not heard about the biopsies in 3 weeks.  ? ? ?SIGNATURES/CONFIDENTIALITY: ?You and/or your care partner have signed paperwork which will be entered into your electronic medical record.  These signatures attest to the fact that that the information above on your After Visit Summary has been reviewed and is understood.  Full responsibility of the confidentiality of this discharge   discharge information lies with you and/or your care-partner. 

## 2020-06-16 NOTE — Progress Notes (Signed)
pt tolerated well. VSS. awake and to recovery. Report given to RN.  

## 2020-06-16 NOTE — Op Note (Signed)
[facility] Patient Name: Jessica Chang Procedure Date: 06/16/2020 8:59 AM MRN: [id number] Endoscopist: Mauri Pole , MD Age: 49 Referring MD:  Date of Birth: 1970-10-02 Gender: Female Account #: [id number] Procedure:                Colonoscopy Indications:              Screening for colorectal malignant neoplasm Medicines:                Monitored Anesthesia Care Procedure:                Pre-Anesthesia Assessment:                           - Prior to the procedure, a History and Physical                            was performed, and patient medications and                            allergies were reviewed. The patient's tolerance of                            previous anesthesia was also reviewed. The risks                            and benefits of the procedure and the sedation                            options and risks were discussed with the patient.                            All questions were answered, and informed consent                            was obtained. Prior Anticoagulants: The patient has                            taken no previous anticoagulant or antiplatelet                            agents. ASA Grade Assessment: II - A patient with                            mild systemic disease. After reviewing the risks                            and benefits, the patient was deemed in                            satisfactory condition to undergo the procedure.                           After obtaining informed consent, the colonoscope  was passed under direct vision. Throughout the                            procedure, the patient's blood pressure, pulse, and                            oxygen saturations were monitored continuously. The                            Colonoscope was introduced through the anus and                            advanced to the the cecum, identified by                            appendiceal orifice  and ileocecal valve. The                            colonoscopy was performed without difficulty. The                            patient tolerated the procedure well. The quality                            of the bowel preparation was good. The ileocecal                            valve, appendiceal orifice, and rectum were                            photographed. Scope In: 9:08:43 AM Scope Out: 9:31:24 AM Scope Withdrawal Time: 0 hours 13 minutes 16 seconds  Total Procedure Duration: 0 hours 22 minutes 41 seconds  Findings:                 The perianal and digital rectal examinations were                            normal.                           A less than 1 mm polyp was found in the transverse                            colon. The polyp was sessile. The polyp was removed                            with a cold biopsy forceps. Resection and retrieval                            were complete.                           A 5 mm polyp was found in the transverse colon. The  polyp was sessile. The polyp was removed with a                            cold snare. Resection and retrieval were complete.                           Scattered small-mouthed diverticula were found in                            the sigmoid colon, descending colon, transverse                            colon and ascending colon.                           Non-bleeding internal hemorrhoids were found during                            retroflexion. The hemorrhoids were small. Complications:            No immediate complications. Estimated Blood Loss:     Estimated blood loss was minimal. Impression:               - One less than 1 mm polyp in the transverse colon,                            removed with a cold biopsy forceps. Resected and                            retrieved.                           - One 5 mm polyp in the transverse colon, removed                            with a cold snare.  Resected and retrieved.                           - Diverticulosis in the sigmoid colon, in the                            descending colon, in the transverse colon and in                            the ascending colon.                           - Non-bleeding internal hemorrhoids. Recommendation:           - Patient has a contact number available for                            emergencies. The signs and symptoms of potential                            delayed  complications were discussed with the                            patient. Return to normal activities tomorrow.                            Written discharge instructions were provided to the                            patient.                           - Resume previous diet.                           - Continue present medications.                           - Await pathology results.                           - Repeat colonoscopy in 5-10 years for surveillance                            based on pathology results. Mauri Pole, MD 06/16/2020 9:39:54 AM This report has been signed electronically.

## 2020-06-16 NOTE — Progress Notes (Signed)
Called to room to assist during endoscopic procedure.  Patient ID and intended procedure confirmed with present staff. Received instructions for my participation in the procedure from the performing physician.  

## 2020-06-16 NOTE — Progress Notes (Signed)
V/S-NS  Check-in AR

## 2020-06-19 ENCOUNTER — Encounter: Payer: Self-pay | Admitting: Gastroenterology

## 2020-06-22 ENCOUNTER — Other Ambulatory Visit: Payer: Self-pay | Admitting: Physician Assistant

## 2020-06-22 DIAGNOSIS — F4320 Adjustment disorder, unspecified: Secondary | ICD-10-CM

## 2020-07-20 DIAGNOSIS — Z85828 Personal history of other malignant neoplasm of skin: Secondary | ICD-10-CM | POA: Diagnosis not present

## 2020-07-20 DIAGNOSIS — D2262 Melanocytic nevi of left upper limb, including shoulder: Secondary | ICD-10-CM | POA: Diagnosis not present

## 2020-07-20 DIAGNOSIS — L821 Other seborrheic keratosis: Secondary | ICD-10-CM | POA: Diagnosis not present

## 2020-07-20 DIAGNOSIS — L570 Actinic keratosis: Secondary | ICD-10-CM | POA: Diagnosis not present

## 2020-07-20 DIAGNOSIS — L218 Other seborrheic dermatitis: Secondary | ICD-10-CM | POA: Diagnosis not present

## 2020-08-18 ENCOUNTER — Emergency Department (HOSPITAL_COMMUNITY)
Admission: EM | Admit: 2020-08-18 | Discharge: 2020-08-18 | Disposition: A | Discharge status: Home / Self Care | Payer: BC Managed Care – PPO | Attending: Emergency Medicine | Admitting: Emergency Medicine

## 2020-08-18 ENCOUNTER — Other Ambulatory Visit: Payer: Self-pay

## 2020-08-18 ENCOUNTER — Emergency Department (HOSPITAL_COMMUNITY): Payer: BC Managed Care – PPO

## 2020-08-18 ENCOUNTER — Telehealth: Payer: Self-pay | Admitting: Physician Assistant

## 2020-08-18 ENCOUNTER — Encounter (HOSPITAL_COMMUNITY): Payer: Self-pay

## 2020-08-18 DIAGNOSIS — Z79899 Other long term (current) drug therapy: Secondary | ICD-10-CM | POA: Diagnosis not present

## 2020-08-18 DIAGNOSIS — I10 Essential (primary) hypertension: Secondary | ICD-10-CM | POA: Diagnosis not present

## 2020-08-18 DIAGNOSIS — Z7984 Long term (current) use of oral hypoglycemic drugs: Secondary | ICD-10-CM | POA: Insufficient documentation

## 2020-08-18 DIAGNOSIS — Z87891 Personal history of nicotine dependence: Secondary | ICD-10-CM | POA: Insufficient documentation

## 2020-08-18 DIAGNOSIS — R079 Chest pain, unspecified: Secondary | ICD-10-CM | POA: Diagnosis not present

## 2020-08-18 DIAGNOSIS — Z8616 Personal history of COVID-19: Secondary | ICD-10-CM | POA: Diagnosis not present

## 2020-08-18 DIAGNOSIS — E119 Type 2 diabetes mellitus without complications: Secondary | ICD-10-CM | POA: Insufficient documentation

## 2020-08-18 DIAGNOSIS — R0789 Other chest pain: Secondary | ICD-10-CM | POA: Insufficient documentation

## 2020-08-18 DIAGNOSIS — R0602 Shortness of breath: Secondary | ICD-10-CM | POA: Diagnosis not present

## 2020-08-18 HISTORY — DX: COVID-19: U07.1

## 2020-08-18 LAB — URINALYSIS, ROUTINE W REFLEX MICROSCOPIC
Bilirubin Urine: NEGATIVE
Glucose, UA: NEGATIVE mg/dL
Hgb urine dipstick: NEGATIVE
Ketones, ur: NEGATIVE mg/dL
Leukocytes,Ua: NEGATIVE
Nitrite: NEGATIVE
Protein, ur: NEGATIVE mg/dL
Specific Gravity, Urine: 1.014 (ref 1.005–1.030)
pH: 5 (ref 5.0–8.0)

## 2020-08-18 LAB — COMPREHENSIVE METABOLIC PANEL
ALT: 43 U/L (ref 0–44)
AST: 30 U/L (ref 15–41)
Albumin: 5 g/dL (ref 3.5–5.0)
Alkaline Phosphatase: 58 U/L (ref 38–126)
Anion gap: 10 (ref 5–15)
BUN: 17 mg/dL (ref 6–20)
CO2: 24 mmol/L (ref 22–32)
Calcium: 9.9 mg/dL (ref 8.9–10.3)
Chloride: 100 mmol/L (ref 98–111)
Creatinine, Ser: 0.7 mg/dL (ref 0.44–1.00)
GFR, Estimated: >60 mL/min (ref >60)
Glucose, Bld: 146 mg/dL — ABNORMAL HIGH (ref 70–99)
Potassium: 4.4 mmol/L (ref 3.5–5.1)
Sodium: 134 mmol/L — ABNORMAL LOW (ref 135–145)
Total Bilirubin: 0.6 mg/dL (ref 0.3–1.2)
Total Protein: 7.8 g/dL (ref 6.5–8.1)

## 2020-08-18 LAB — CBC WITH DIFFERENTIAL/PLATELET
Abs Immature Granulocytes: 0.02 10*3/uL (ref 0.00–0.07)
Basophils Absolute: 0 10*3/uL (ref 0.0–0.1)
Basophils Relative: 0 %
Eosinophils Absolute: 0.2 10*3/uL (ref 0.0–0.5)
Eosinophils Relative: 3 %
HCT: 37.1 % (ref 36.0–46.0)
Hemoglobin: 12.5 g/dL (ref 12.0–15.0)
Immature Granulocytes: 0 %
Lymphocytes Relative: 39 %
Lymphs Abs: 2.8 10*3/uL (ref 0.7–4.0)
MCH: 30.3 pg (ref 26.0–34.0)
MCHC: 33.7 g/dL (ref 30.0–36.0)
MCV: 89.8 fL (ref 80.0–100.0)
Monocytes Absolute: 0.5 10*3/uL (ref 0.1–1.0)
Monocytes Relative: 7 %
Neutro Abs: 3.6 10*3/uL (ref 1.7–7.7)
Neutrophils Relative %: 51 %
Platelets: 279 10*3/uL (ref 150–400)
RBC: 4.13 MIL/uL (ref 3.87–5.11)
RDW: 12.1 % (ref 11.5–15.5)
WBC: 7.1 10*3/uL (ref 4.0–10.5)
nRBC: 0 % (ref 0.0–0.2)

## 2020-08-18 LAB — TROPONIN I (HIGH SENSITIVITY)
Troponin I (High Sensitivity): <2 ng/L (ref <18)
Troponin I (High Sensitivity): <2 ng/L (ref <18)

## 2020-08-18 LAB — D-DIMER, QUANTITATIVE: D-Dimer, Quant: 0.34 ug/mL-FEU (ref 0.00–0.50)

## 2020-08-18 LAB — I-STAT BETA HCG BLOOD, ED (MC, WL, AP ONLY): I-stat hCG, quantitative: <5 m[IU]/mL

## 2020-08-18 NOTE — ED Notes (Signed)
Pt in bed on phone. Respirations even and unlabored. Pt complains of mid sternal pain. MD aware.

## 2020-08-18 NOTE — ED Notes (Signed)
Pt in bed resting, respirations even and unlabored. No s/s of distress. Will continue to monitor.

## 2020-08-18 NOTE — Discharge Instructions (Addendum)
Work-up here today was reassuring. We recommend follow-up with cardiology and pulmonology on this matter.  Call to make these appointments. Return for worsening pain, persistent shortness of breath, dizziness, passing out, lower extremity swelling/pain, or any other major concerns.

## 2020-08-18 NOTE — ED Triage Notes (Signed)
Patient reports that she was SOB prior to going to work. Patient states her pulse ox was 89% at work after walking approx 40-50 ft. and HR was 140. Patient states she was having burning in between shoulder blades and mid chest. Patient states she had Covid in August 2021.

## 2020-08-18 NOTE — ED Notes (Signed)
Patient transported to X-ray. Patient made aware UA needed.

## 2020-08-18 NOTE — Telephone Encounter (Signed)
Patient called office with complaint of SOB, Chest pain, High BP, and elevated BS.   Patient states her oxygen level was at 92% and that she is concerned she may have a PE from her Covid infection.   I advised patient to call EMS and have them take her to ED for evaluation and treatment. Patient verbalized understanding and was agreeable. AS< CMA

## 2020-08-18 NOTE — ED Provider Notes (Signed)
[facility] DEPT Provider Note   CSN: [id number] Arrival date & time: 08/18/20  9798     History Chief Complaint  Patient presents with  . Chest Pain  . Shortness of Breath    Jessica Chang is a 49 y.o. female.  HPI  HPI: A 49 year old patient with a history of hypertension and hypercholesterolemia presents for evaluation of chest pain. Initial onset of pain was approximately 1-3 hours ago. The patient's chest pain is worse with exertion. The patient's chest pain is middle- or left-sided, is not well-localized, is not described as heaviness/pressure/tightness, is not sharp and does not radiate to the arms/jaw/neck. The patient does not complain of nausea and denies diaphoresis. The patient has no history of stroke, has no history of peripheral artery disease, has not smoked in the past 90 days, denies any history of treated diabetes, has no relevant family history of coronary artery disease (first degree relative at less than age 33) and does not have an elevated BMI (>=30).    Jessica Chang is a 49 y.o. female, with a history of hypothyroidism, hyperlipidemia, HTN, DM, presenting to the ED with an episode of chest discomfort and shortness of breath that occurred shortly prior to arrival.  Patient states since having Covid and September of this year she has maintained a low level of shortness of breath, especially with exertion, as well as frequently intermittent chest discomfort aching/burning, sometimes radiating to the back. She has had these episodes last night and this morning.  When she was walking around her clinic today, she felt an increase in her chest discomfort and shortness of breath, measured her pulse rate at 140 bpm and noted her SPO2 was 89%. These increased symptoms have since returned to their baseline level.  Current aching in the central chest is 2/10, nonradiating.  Denies current shortness of breath.  She states she also spoke with  her endocrinologist who mentioned that they would appreciate's "checking for infection, such as in the chest or urine" since the patient has had difficulty with controlling her blood sugars lately.    Denies fever/chills, dizziness, syncope, abdominal pain, lower extremity edema/pain, orthopnea, urinary symptoms, acute back pain, numbness, weakness, N/V/D, or any other complaints.    Past Medical History:  Diagnosis Date  . Acute pancreatitis   . Allergy    seasonal   . Arthritis    2021 right hip  . Cataract    2020  . COVID   . Diabetes ([place])   . Gallstones   . Hyperlipidemia   . Hypertension   . Pneumonia   . Squamous cell carcinoma    skin-sx on 2021  . Thyroid disease    2016    Patient Active Problem List   Diagnosis Date Noted  . Hypercholesterolemia 03/17/2020  . Malignant neoplasm of skin 03/17/2020  . Arm paresthesia, right 01/29/2020  . Thyroid disease- management per endocrinology Dr. Soyla Murphy   . Multiple allergies 05/18/2019  . Pain in left arm 10/23/2018  . Paresthesia of upper limb 10/23/2018  . Right serous otitis media 03/20/2018  . Acute otitis externa of right ear 03/20/2018  . Vitamin D deficiency 06/19/2017  . Adjustment disorder 03/06/2017  . Diabetic cataract ([place]) 08/31/2016  . h/o Drug-induced acute pancreatitis (2nd [place] or onglyza) 05/27/2016  . Elevated liver enzymes 05/27/2016  . Chronic Mild Hypercalcemia 05/27/2016  . Overweight (BMI 25.0-29.9) 02/21/2016  . Pain, abdominal, nonspecific 02/21/2016  . Skin lesion 02/21/2016  . Enthesopathy of hip  10/19/2014  . Cramp in muscle 08/13/2013  . Hyperlipidemia associated with type 2 diabetes mellitus ([place]) 10/29/2012  . Hypertension associated with diabetes ([place]) 10/29/2012  . Diabetes [facility])     Past Surgical History:  Procedure Laterality Date  . CATARACT EXTRACTION  2020  . CERVICAL SPINE SURGERY    . CESAREAN SECTION  2008  . CHOLECYSTECTOMY  2005  . HIP ARTHROSCOPY Right 2011,  2016  . KNEE ARTHROSCOPY Right 1988  . SKIN CANCER EXCISION  2021  . TONSILECTOMY, ADENOIDECTOMY, BILATERAL MYRINGOTOMY AND TUBES  1989  . TONSILLECTOMY  1989  . WISDOM TOOTH EXTRACTION  1990     OB History   No obstetric history on file.     Family History  Problem Relation Age of Onset  . Heart disease Mother   . Basal cell carcinoma Mother   . Diabetes Father   . Hyperlipidemia Father   . Hypertension Father   . Colon polyps Father 52  . Prostate cancer Father 42  . Melanoma Father   . Aneurysm Father   . AAA (abdominal aortic aneurysm) Father 20       Thoracic aorta repair and AVR  . Alcohol abuse Sister   . Mental illness Sister   . Asthma Sister   . Urticaria Sister   . Allergic rhinitis Sister   . Heart attack Paternal Aunt   . Melanoma Paternal Aunt   . Breast cancer Maternal Grandmother   . AAA (abdominal aortic aneurysm) Maternal Grandfather   . Diabetes Paternal Grandmother   . Stroke Paternal Grandfather   . Melanoma Paternal Grandfather   . Colon cancer Neg Hx   . Esophageal cancer Neg Hx   . Rectal cancer Neg Hx   . Stomach cancer Neg Hx     Social History   Tobacco Use  . Smoking status: Former Smoker    Packs/day: 0.25    Years: 5.00    Pack years: 1.25    Quit date: 09/19/2005    Years since quitting: 14.9  . Smokeless tobacco: Never Used  . Tobacco comment: Quit 2007  Vaping Use  . Vaping Use: Never used  Substance Use Topics  . Alcohol use: Yes    Comment: occassionally  . Drug use: No    Home Medications Prior to Admission medications   Medication Sig Start Date End Date Taking? Authorizing Provider  atorvastatin (LIPITOR) 10 MG tablet Take 10 mg by mouth daily.   Yes [provider]  cetirizine (ZYRTEC) 10 MG tablet Take 10 mg by mouth daily.    Yes [provider]  Cholecalciferol (VITAMIN D3) 5000 units TABS 5,000 IU OTC vitamin D3 daily. Patient taking differently: Take 5,000 Units by mouth daily.  06/19/17   Yes Opalski, Neoma Laming, DO  EPINEPHrine 0.3 mg/0.3 mL IJ SOAJ injection Inject 0.3 mLs (0.3 mg total) into the muscle as needed for anaphylaxis. Patient taking differently: Inject 0.3 mg into the muscle daily as needed for anaphylaxis.  04/24/19  Yes Opalski, Deborah, DO  FLUoxetine (PROZAC) 40 MG capsule TAKE 1 CAPSULE BY MOUTH EVERY DAY Patient taking differently: Take 40 mg by mouth daily.  06/22/20  Yes Abonza, Maritza, PA-C  glimepiride (AMARYL) 2 MG tablet Take 2 mg by mouth daily. 08/06/20  Yes [provider]  hydrocortisone 2.5 % cream Apply 1 application topically daily as needed (skin irritation).  07/20/20  Yes [provider]  Ibuprofen 200 MG CAPS Take 600 mg by mouth daily as needed (pain).  Yes [provider]  levonorgestrel (MIRENA) 20 MCG/24HR IUD 1 each by Intrauterine route continuous.  02/05/10  Yes [provider]  levothyroxine (SYNTHROID) 88 MCG tablet Take 88 mcg by mouth daily before breakfast.  04/26/19  Yes [provider]  lisinopril (PRINIVIL,ZESTRIL) 20 MG tablet Take 20 mg by mouth daily. 04/03/17  Yes [provider]  Melatonin 5 MG TABS Take 5-10 mg by mouth daily as needed (sleep).    Yes [provider]  metFORMIN (GLUCOPHAGE-XR) 500 MG 24 hr tablet Take 1,000 tablets by mouth in the morning and at bedtime.  05/01/16  Yes [provider]  Multiple Vitamins-Minerals (HAIR/SKIN/NAILS/BIOTIN PO) Take 1 tablet by mouth daily.    Yes [provider]  glucose blood test strip Check 2 hour postprandial and fasting blood sugars daily; also if you feel poorly 05/25/16   Opalski, Neoma Laming, DO    Allergies    Januvia [sitagliptin], Onglyza [saxagliptin], Cherry, Lidocaine, and Peach [prunus persica]  Review of Systems   Review of Systems  Constitutional: Negative for chills, diaphoresis and fever.  Respiratory: Positive for shortness of breath (none currently). Negative for cough.   Cardiovascular:  Positive for chest pain. Negative for leg swelling.  Gastrointestinal: Negative for abdominal pain, diarrhea, nausea and vomiting.  Genitourinary: Negative for difficulty urinating, dysuria, flank pain and hematuria.  Musculoskeletal: Negative for back pain.  Neurological: Negative for dizziness, syncope, weakness and numbness.  All other systems reviewed and are negative.   Physical Exam Updated Vital Signs BP (!) 149/100 (BP Location: Right Arm)   Pulse 90   Temp 98.1 F (36.7 C) (Oral)   Resp 18   Ht 5\' 7"  (1.702 m)   Wt 77.6 kg   SpO2 100%   BMI 26.78 kg/m   Physical Exam Vitals and nursing note reviewed.  Constitutional:      General: She is not in acute distress.    Appearance: She is well-developed. She is not diaphoretic.  HENT:     Head: Normocephalic and atraumatic.     Mouth/Throat:     Mouth: Mucous membranes are moist.     Pharynx: Oropharynx is clear.  Eyes:     Conjunctiva/sclera: Conjunctivae normal.  Cardiovascular:     Rate and Rhythm: Normal rate and regular rhythm.     Pulses: Normal pulses.          Radial pulses are 2+ on the right side and 2+ on the left side.       Posterior tibial pulses are 2+ on the right side and 2+ on the left side.     Heart sounds: Normal heart sounds.     Comments: Tactile temperature in the extremities appropriate and equal bilaterally. Pulmonary:     Effort: Pulmonary effort is normal. No respiratory distress.     Breath sounds: Normal breath sounds.     Comments: No increased work of breathing.  Speaks in full sentences without difficulty. Abdominal:     Palpations: Abdomen is soft.     Tenderness: There is no abdominal tenderness. There is no guarding.  Musculoskeletal:     Cervical back: Neck supple.     Right lower leg: No edema.     Left lower leg: No edema.  Lymphadenopathy:     Cervical: No cervical adenopathy.  Skin:    General: Skin is warm and dry.  Neurological:     Mental Status: She is alert.    Psychiatric:  Mood and Affect: Mood and affect normal.        Speech: Speech normal.        Behavior: Behavior normal.     ED Results / Procedures / Treatments   Labs (all labs ordered are listed, but only abnormal results are displayed) Labs Reviewed  COMPREHENSIVE METABOLIC PANEL - Abnormal; Notable for the following components:      Result Value   Sodium 134 (*)    Glucose, Bld 146 (*)    All other components within normal limits  URINE CULTURE  CBC WITH DIFFERENTIAL/PLATELET  D-DIMER, QUANTITATIVE (NOT AT [facility])  URINALYSIS, ROUTINE W REFLEX MICROSCOPIC  I-STAT BETA HCG BLOOD, ED ([facility], [facility], [facility] ONLY)  TROPONIN I (HIGH SENSITIVITY)  TROPONIN I (HIGH SENSITIVITY)    EKG EKG Interpretation  Date/Time:  Tuesday August 18 2020 09:19:45 EST Ventricular Rate:  91 PR Interval:    QRS Duration: 87 QT Interval:  362 QTC Calculation: 446 R Axis:   23 Text Interpretation: Sinus rhythm Low voltage, precordial leads 12 Lead; [place] Confirmed by Sherwood Gambler [id number]) on 08/18/2020 11:08:12 AM   Radiology DG Chest 2 View  Result Date: 08/18/2020 CLINICAL DATA:  Chest pain shortness of breath. EXAM: CHEST - 2 VIEW COMPARISON:  Chest radiograph 09/13/2016. FINDINGS: The heart size and mediastinal contours are within normal limits. Both lungs are clear. The visualized skeletal structures are unremarkable. Overlying EKG leads. Partially imaged cervical ACDF. Cholecystectomy clips. IMPRESSION: No acute cardiopulmonary disease. Electronically Signed   By: Margaretha Sheffield MD   On: 08/18/2020 11:31    Procedures Procedures (including critical care time)  Medications Ordered in ED Medications - No data to display  ED Course  I have reviewed the triage vital signs and the nursing notes.  Pertinent labs & imaging results that were available during my care of the patient were reviewed by me and considered in my medical decision making (see chart for details).  Clinical  Course as of Aug 18 1604  Tue Aug 18, 2020  1220 Discussed the resulted lab work with the patient.  She states she is symptom-free at this time.   [SJ]    Clinical Course User Index [SJ] Braelin Costlow C, PA-C   MDM Rules/Calculators/A&P HEAR Score: 3                        Patient presents with chest discomfort and shortness of breath.  Patient is nontoxic appearing, afebrile, not tachycardic, not tachypneic, not hypotensive, maintains excellent SPO2 on room air, and is in no apparent distress.   I have reviewed the patient's chart to obtain more information.   I reviewed and interpreted the patient's labs and radiological studies. HEART score is 3, indicating low risk for a cardiac event.  EKG without evidence of acute ischemia or pathologic/symptomatic arrhythmia.  Delta troponins negative. Wells criteria score is 0, indicating low risk for PE.  D-dimer negative Dissection was considered, but thought less likely base on: History and description of the pain are not suggestive, patient is not ill-appearing, lack of risk factors, equal bilateral pulses, lack of neurologic deficits, no widened mediastinum on chest x-ray.  Due to patient's symptoms persisting from her Covid infection, we will have her follow-up with cardiology and pulmonology.   Findings and plan of care discussed with Sherwood Gambler, MD.    Vitals:   08/18/20 6063 08/18/20 0945 08/18/20 1030 08/18/20 1100  BP:  128/86 129/82 130/83  Pulse:  79  79 84  Resp:  15 19 15   Temp:      TempSrc:      SpO2:  98% 99% 97%  Weight: 77.6 kg     Height: 5\' 7"  (1.702 m)        Final Clinical Impression(s) / ED Diagnoses Final diagnoses:  Atypical chest pain    Rx / DC Orders ED Discharge Orders    None       Layla Maw 08/18/20 1608    Sherwood Gambler, MD 08/24/20 2025

## 2020-08-19 LAB — URINE CULTURE

## 2020-08-24 LAB — HM DIABETES EYE EXAM

## 2020-08-27 ENCOUNTER — Encounter: Payer: Self-pay | Admitting: Internal Medicine

## 2020-08-27 ENCOUNTER — Other Ambulatory Visit: Payer: Self-pay

## 2020-08-27 ENCOUNTER — Ambulatory Visit: Payer: BC Managed Care – PPO | Admitting: Internal Medicine

## 2020-08-27 VITALS — BP 148/90 | HR 97 | Ht 67.0 in | Wt 174.0 lb

## 2020-08-27 DIAGNOSIS — E1159 Type 2 diabetes mellitus with other circulatory complications: Secondary | ICD-10-CM | POA: Diagnosis not present

## 2020-08-27 DIAGNOSIS — Z8249 Family history of ischemic heart disease and other diseases of the circulatory system: Secondary | ICD-10-CM | POA: Insufficient documentation

## 2020-08-27 DIAGNOSIS — R072 Precordial pain: Secondary | ICD-10-CM

## 2020-08-27 DIAGNOSIS — R079 Chest pain, unspecified: Secondary | ICD-10-CM | POA: Insufficient documentation

## 2020-08-27 DIAGNOSIS — E1169 Type 2 diabetes mellitus with other specified complication: Secondary | ICD-10-CM

## 2020-08-27 DIAGNOSIS — R0609 Other forms of dyspnea: Secondary | ICD-10-CM

## 2020-08-27 DIAGNOSIS — R0600 Dyspnea, unspecified: Secondary | ICD-10-CM

## 2020-08-27 DIAGNOSIS — I152 Hypertension secondary to endocrine disorders: Secondary | ICD-10-CM

## 2020-08-27 DIAGNOSIS — E785 Hyperlipidemia, unspecified: Secondary | ICD-10-CM

## 2020-08-27 MED ORDER — METOPROLOL TARTRATE 100 MG PO TABS: 100.0000 mg | ORAL_TABLET | Freq: Once | ORAL | 0 refills | Status: DC

## 2020-08-27 MED ORDER — HYDROCHLOROTHIAZIDE 12.5 MG PO CAPS: 12.5000 mg | ORAL_CAPSULE | Freq: Every day | ORAL | 1 refills | Status: DC

## 2020-08-27 NOTE — Progress Notes (Signed)
Cardiology Office Note:    Date:  08/27/2020   ID:  [name], DOB 09/18/1971, MRN [id number]  PCP:  Lorrene Reid, PA-C  [place] Cardiologist:  No primary care provider on file.  [facility] Electrophysiologist:  None   CC: SOB since COVID Consulted for the evaluation of chest pain syndrome at the behest of [place], [place], PA-C  History of Present Illness:    Jessica Chang is a 49 y.o. female with a hx of Diabetes with HTN, Chronic HTN of pregnancy (circa 2007)  HLD, Prior COVID infection in August who presents for evaluation.  Patient notes that she is feeling chest discomfort since bout with Covid but without hospitalization.  Last Monday has shortness of breath and fatigue.  Home Pulse Oximeter was 89%,  Went to the ED with normal novel troponin and normal D dimer. Notes resting pulse has been high than normal.  Has noted increase fatigue, and has had noted chest burning that radiates between her shoulder blades with pectoral sharp pain.   Discomfort occurs randomly, but usually worse with activity and improves with rest.  Patient exertion notable for walking 1-2 miles a day and walking the dog and feels no symptoms.  Has had intermittent shortness of breath that comes and go. but notes weight gain of 10 lbs post COVID, Notes hand swelling but  No leg swelling, or abdominal swelling.  No syncope or near syncope.  Patient reports prior cardiac testing including echo, but no stress test or heart catheterizations.  Ambulatory BP 156/89.  Patient notes that her and her father has had genetic testing done at [facility].  Past Medical History:  Diagnosis Date  . Acute pancreatitis   . Allergy    seasonal   . Arthritis    2021 right hip  . Cataract    2020  . COVID   . Diabetes ([place])   . Gallstones   . Hyperlipidemia   . Hypertension   . Pneumonia   . Squamous cell carcinoma    skin-sx on 2021  . Thyroid disease    2016    Past Surgical History:   Procedure Laterality Date  . CATARACT EXTRACTION  2020  . CERVICAL SPINE SURGERY    . CESAREAN SECTION  2008  . CHOLECYSTECTOMY  2005  . HIP ARTHROSCOPY Right 2011, 2016  . KNEE ARTHROSCOPY Right 1988  . SKIN CANCER EXCISION  2021  . TONSILECTOMY, ADENOIDECTOMY, BILATERAL MYRINGOTOMY AND TUBES  1989  . TONSILLECTOMY  1989  . WISDOM TOOTH EXTRACTION  1990    Current Medications: Current Meds  Medication Sig  . atorvastatin (LIPITOR) 10 MG tablet Take 10 mg by mouth daily.  . cetirizine (ZYRTEC) 10 MG tablet Take 10 mg by mouth daily.  . Cholecalciferol (VITAMIN D3) 5000 units TABS 5,000 IU OTC vitamin D3 daily.  Marland Kitchen EPINEPHrine 0.3 mg/0.3 mL IJ SOAJ injection Inject 0.3 mLs (0.3 mg total) into the muscle as needed for anaphylaxis.  Marland Kitchen FLUoxetine (PROZAC) 40 MG capsule TAKE 1 CAPSULE BY MOUTH EVERY DAY  . glimepiride (AMARYL) 2 MG tablet Take 2 mg by mouth daily.  Marland Kitchen glucose blood test strip Check 2 hour postprandial and fasting blood sugars daily; also if you feel poorly  . hydrocortisone 2.5 % cream Apply 1 application topically daily as needed (skin irritation).   . Ibuprofen 200 MG CAPS Take 600 mg by mouth daily as needed (pain).   Marland Kitchen levonorgestrel (MIRENA) 20 MCG/24HR IUD 1 each by Intrauterine route continuous.   Marland Kitchen  levothyroxine (SYNTHROID) 88 MCG tablet Take 88 mcg by mouth daily before breakfast.   . lisinopril (PRINIVIL,ZESTRIL) 20 MG tablet Take 20 mg by mouth daily.  . metFORMIN (GLUCOPHAGE-XR) 500 MG 24 hr tablet Take 1,000 tablets by mouth in the morning and at bedtime.   . Multiple Vitamins-Minerals (HAIR/SKIN/NAILS/BIOTIN PO) Take 1 tablet by mouth daily.      Allergies:   Januvia [sitagliptin], Onglyza [saxagliptin], Cherry, Lidocaine, and Peach [prunus persica]   Social History   Socioeconomic History  . Marital status: Married    Spouse name: Not on file  . Number of children: 1  . Years of education: Not on file  . Highest education level: Not on file   Occupational History  . Occupation: physical therapy  Tobacco Use  . Smoking status: Former Smoker    Packs/day: 0.25    Years: 5.00    Pack years: 1.25    Quit date: 09/19/2005    Years since quitting: 14.9  . Smokeless tobacco: Never Used  . Tobacco comment: Quit 2007  Vaping Use  . Vaping Use: Never used  Substance and Sexual Activity  . Alcohol use: Yes    Comment: occassionally  . Drug use: No  . Sexual activity: Yes    Birth control/protection: I.U.D.  Other Topics Concern  . Not on file  Social History Narrative  . Not on file   Social Determinants of Health   Financial Resource Strain: Not on file  Food Insecurity: Not on file  Transportation Needs: Not on file  Physical Activity: Not on file  Stress: Not on file  Social Connections: Not on file    Patient is an athletic trainor  Family History: The patient's family history includes AAA (abdominal aortic aneurysm) in her maternal grandfather; AAA (abdominal aortic aneurysm) (age of onset: 30) in her father; Alcohol abuse in her sister; Allergic rhinitis in her sister; Aneurysm in her father; Asthma in her sister; Basal cell carcinoma in her mother; Breast cancer in her maternal grandmother; Colon polyps (age of onset: 32) in her father; Diabetes in her father and paternal grandmother; Heart attack in her paternal aunt; Heart disease in her mother; Hyperlipidemia in her father; Hypertension in her father; Melanoma in her father, paternal aunt, and paternal grandfather; Mental illness in her sister; Prostate cancer (age of onset: 49) in her father; Stroke in her paternal grandfather; Urticaria in her sister. There is no history of Colon cancer, Esophageal cancer, Rectal cancer, or Stomach cancer. Father had Bental Procedure: for thoracic aortic aneursym. Mother had HFrecoveredEF  ROS:   Please see the history of present illness.    All other systems reviewed and are negative.  EKGs/Labs/Other Studies Reviewed:     The following studies were reviewed today:  EKG:   08/19/20:  SR 91 WNL 08/27/20 SR 80 WNL  Recent Labs: 08/18/2020: ALT 43; BUN 17; Creatinine, Ser 0.70; Hemoglobin 12.5; Platelets 279; Potassium 4.4; Sodium 134  Recent Lipid Panel    Component Value Date/Time   CHOL 186 10/11/2018 0000   CHOL 177 06/16/2017 0850   TRIG 122 10/11/2018 0000   HDL 46 10/11/2018 0000   HDL 45 06/16/2017 0850   CHOLHDL 3.9 06/16/2017 0850   CHOLHDL 4.0 05/20/2016 0815   VLDL 28 05/20/2016 0815   LDLCALC 116 10/11/2018 0000   LDLCALC 108 (H) 06/16/2017 0850   07/14/2006 Echo Report Only SUMMARY  - Left ventricular size was at the upper limits of normal. Overall  left ventricular systolic function was vigorous. Left     ventricular ejection fraction was estimated , range being 65     % to 70 %. There were no left ventricular regional wall     motion abnormalities.  - No echocardiographic evidence for mitral valve prolapse.  - The left atrium was mild to moderately dilated.    Risk Assessment/Calculations:     N/A  Physical Exam:    VS:  BP (!) 148/90   Pulse 97   Ht _0  (1.702 m)   Wt 174 lb (78.9 kg)   SpO2 98%   BMI 27.25 kg/m     Wt Readings from Last 3 Encounters:  08/27/20 174 lb (78.9 kg)  08/18/20 171 lb (77.6 kg)  06/16/20 171 lb (77.6 kg)     GEN:  Well nourished, well developed in no acute distress HEENT: Normal NECK: No JVD; No carotid bruits LYMPHATICS: No lymphadenopathy CARDIAC: RRR, no murmurs, rubs, gallops RESPIRATORY:  Clear to auscultation without rales, wheezing or rhonchi  ABDOMEN: Soft, non-tender, non-distended MUSCULOSKELETAL:  No edema; No deformity  SKIN: Warm and dry NEUROLOGIC:  Alert and oriented x 3 PSYCHIATRIC:  Normal affect   ASSESSMENT:    1. Chest pain of uncertain etiology   2. Hyperlipidemia associated with type 2 diabetes mellitus ([place])   3. Hypertension associated with diabetes ([place])   4. Family  history of aortic aneurysm   5. Precordial pain   6. DOE (dyspnea on exertion)    PLAN:    In order of problems listed above:  Chest Pain With Dyspnea or exertion - The patient presents with possibly cardiac - Would recommend an echocardiogram to assess LVEF and exclude WMA.  - Would recommend CCTA  +/- FFR to exclude obstructive CAD   Diabetes with Hypertension HLD - ambulatory blood pressure 156/80, will start/continue ambulatory BP monitoring; gave education on how to perform ambulatory blood pressure monitoring including the frequency and technique; goal ambulatory blood pressure < 135/85 on average - continue home medications with the exception of addition HCTZ 12.5 mg Daily - will get labs in 7-10 days (BMET, Mg) - discussed diet (DASH/low sodium), and exercise/weight loss interventions   Family history of Thoracic and Abdominal Aortic Aneurysm - no prior TAA, no indication to check AAA presently - will try to get old genetic testing records from [facility]  3 month follow up unless new symptoms or abnormal test results warranting change in plan  Would be reasonable for  APP Follow up   Shared Decision Making/Informed Consent      Patient amenable to plan  Medication Adjustments/Labs and Tests Ordered: Current medicines are reviewed at length with the patient today.  Concerns regarding medicines are outlined above.  Orders Placed This Encounter  Procedures  . CT CORONARY MORPH W/CTA COR W/SCORE W/CA W/CM &/OR WO/CM  . CT CORONARY FRACTIONAL FLOW RESERVE DATA PREP  . CT CORONARY FRACTIONAL FLOW RESERVE FLUID ANALYSIS  . Basic metabolic panel  . Magnesium  . EKG 12-Lead  . ECHOCARDIOGRAM COMPLETE   Meds ordered this encounter  Medications  . hydrochlorothiazide (MICROZIDE) 12.5 MG capsule    Sig: Take 1 capsule (12.5 mg total) by mouth daily.    Dispense:  90 capsule    Refill:  1  . metoprolol tartrate (LOPRESSOR) 100 MG tablet    Sig: Take 1 tablet (100 mg  total) by mouth once for 1 dose. Take 2 hours prior to your Coronary CT.  Dispense:  1 tablet    Refill:  0    Patient Instructions  Medication Instructions:   START TAKING HYDROCHLOROTHIAZIDE 12.5 MG BY MOUTH DAILY  *If you need a refill on your cardiac medications before your next appointment, please call your pharmacy*   Lab Work:  IN 7-10 DAYS TO CHECK A BMET AND MAGNESIUM LEVEL  If you have labs (blood work) drawn today and your tests are completely normal, you will receive your results only by: Marland Kitchen MyChart Message (if you have MyChart) OR . A paper copy in the mail If you have any lab test that is abnormal or we need to change your treatment, we will call you to review the results.   Testing/Procedures:  Your physician has requested that you have an echocardiogram. Echocardiography is a painless test that uses sound waves to create images of your heart. It provides your doctor with information about the size and shape of your heart and how well your heart's chambers and valves are working. This procedure takes approximately one hour. There are no restrictions for this procedure.   Your cardiac CT will be scheduled at one of the below locations:   [facility] [address] [place], [place] [address] [phone]  If scheduled at [facility], please arrive at the [facility] main entrance of [facility] 30 minutes prior to test start time. Proceed to the [facility] Radiology Department (first floor) to check-in and test prep.  Please follow these instructions carefully (unless otherwise directed):  On the Night Before the Test: . Be sure to Drink plenty of water. . Do not consume any caffeinated/decaffeinated beverages or chocolate 12 hours prior to your test. . Do not take any antihistamines 12 hours prior to your test.  On the Day of the Test: . Drink plenty of water. Do not drink any water within one hour of the test. . Do not eat  any food 4 hours prior to the test. . You may take your regular medications prior to the test.  . Take metoprolol 100 mg by mouth  (Lopressor) two hours prior to test. . HOLD Hydrochlorothiazide morning of the test. . FEMALES- please wear underwire-free bra if available     After the Test: . Drink plenty of water. . After receiving IV contrast, you may experience a mild flushed feeling. This is normal. . On occasion, you may experience a mild rash up to 24 hours after the test. This is not dangerous. If this occurs, you can take Benadryl 25 mg and increase your fluid intake. . If you experience trouble breathing, this can be serious. If it is severe call 911 IMMEDIATELY. If it is mild, please call our office. . If you take any of these medications: Glipizide/Metformin, Avandament, Glucavance, please do not take 48 hours after completing test unless otherwise instructed.  Once we have confirmed authorization from your insurance company, we will call you to set up a date and time for your test. Based on how quickly your insurance processes prior authorizations requests, please allow up to 4 weeks to be contacted for scheduling your Cardiac CT appointment. Be advised that routine Cardiac CT appointments could be scheduled as many as 8 weeks after your provider has ordered it.  For non-scheduling related questions, please contact the cardiac imaging nurse navigator should you have any questions/concerns: Marchia Bond, Cardiac Imaging Nurse Navigator Burley Saver, Interim Cardiac Imaging Nurse Navigator [facility] Direct Office  Dial: [phone]   For scheduling needs, including cancellations and rescheduling, please call [place], [phone].   Follow-Up:  3 MONTHS IN THE OFFICE WITH DR. Gasper Sells       Signed, Werner Lean, MD  08/27/2020 9:25 AM    [facility]

## 2020-08-27 NOTE — Patient Instructions (Signed)
Medication Instructions:   START TAKING HYDROCHLOROTHIAZIDE 12.5 MG BY MOUTH DAILY  *If you need a refill on your cardiac medications before your next appointment, please call your pharmacy*   Lab Work:  IN 7-10 DAYS TO CHECK A BMET AND MAGNESIUM LEVEL  If you have labs (blood work) drawn today and your tests are completely normal, you will receive your results only by: Marland Kitchen MyChart Message (if you have MyChart) OR . A paper copy in the mail If you have any lab test that is abnormal or we need to change your treatment, we will call you to review the results.   Testing/Procedures:  Your physician has requested that you have an echocardiogram. Echocardiography is a painless test that uses sound waves to create images of your heart. It provides your doctor with information about the size and shape of your heart and how well your heart's chambers and valves are working. This procedure takes approximately one hour. There are no restrictions for this procedure.   Your cardiac CT will be scheduled at one of the below locations:   [facility] [address] [place], [place] [address] [phone]  If scheduled at [facility], please arrive at the [facility] main entrance of [facility] 30 minutes prior to test start time. Proceed to the [facility] Radiology Department (first floor) to check-in and test prep.  Please follow these instructions carefully (unless otherwise directed):  On the Night Before the Test: . Be sure to Drink plenty of water. . Do not consume any caffeinated/decaffeinated beverages or chocolate 12 hours prior to your test. . Do not take any antihistamines 12 hours prior to your test.  On the Day of the Test: . Drink plenty of water. Do not drink any water within one hour of the test. . Do not eat any food 4 hours prior to the test. . You may take your regular medications prior to the test.  . Take metoprolol 100 mg by mouth   (Lopressor) two hours prior to test. . HOLD Hydrochlorothiazide morning of the test. . FEMALES- please wear underwire-free bra if available     After the Test: . Drink plenty of water. . After receiving IV contrast, you may experience a mild flushed feeling. This is normal. . On occasion, you may experience a mild rash up to 24 hours after the test. This is not dangerous. If this occurs, you can take Benadryl 25 mg and increase your fluid intake. . If you experience trouble breathing, this can be serious. If it is severe call 911 IMMEDIATELY. If it is mild, please call our office. . If you take any of these medications: Glipizide/Metformin, Avandament, Glucavance, please do not take 48 hours after completing test unless otherwise instructed.  Once we have confirmed authorization from your insurance company, we will call you to set up a date and time for your test. Based on how quickly your insurance processes prior authorizations requests, please allow up to 4 weeks to be contacted for scheduling your Cardiac CT appointment. Be advised that routine Cardiac CT appointments could be scheduled as many as 8 weeks after your provider has ordered it.  For non-scheduling related questions, please contact the cardiac imaging nurse navigator should you have any questions/concerns: Marchia Bond, Cardiac Imaging Nurse Navigator Burley Saver, Interim Cardiac Imaging Nurse [place] [facility] Direct Office Dial: [phone]   For scheduling needs, including cancellations and rescheduling, please call [place], [phone].  Follow-Up:  3 MONTHS IN THE OFFICE WITH DR. Gasper Sells

## 2020-08-31 ENCOUNTER — Encounter: Payer: Self-pay | Admitting: Physician Assistant

## 2020-09-07 ENCOUNTER — Encounter: Payer: Self-pay | Admitting: Internal Medicine

## 2020-09-08 ENCOUNTER — Other Ambulatory Visit: Payer: BC Managed Care – PPO

## 2020-09-08 ENCOUNTER — Ambulatory Visit (HOSPITAL_COMMUNITY): Payer: BC Managed Care – PPO | Attending: Cardiovascular Disease

## 2020-09-08 ENCOUNTER — Other Ambulatory Visit: Payer: Self-pay

## 2020-09-08 DIAGNOSIS — Z8249 Family history of ischemic heart disease and other diseases of the circulatory system: Secondary | ICD-10-CM | POA: Diagnosis not present

## 2020-09-08 DIAGNOSIS — E785 Hyperlipidemia, unspecified: Secondary | ICD-10-CM | POA: Diagnosis not present

## 2020-09-08 DIAGNOSIS — R072 Precordial pain: Secondary | ICD-10-CM

## 2020-09-08 DIAGNOSIS — E1169 Type 2 diabetes mellitus with other specified complication: Secondary | ICD-10-CM | POA: Diagnosis not present

## 2020-09-08 DIAGNOSIS — R0609 Other forms of dyspnea: Secondary | ICD-10-CM

## 2020-09-08 DIAGNOSIS — R0600 Dyspnea, unspecified: Secondary | ICD-10-CM | POA: Insufficient documentation

## 2020-09-08 DIAGNOSIS — E1159 Type 2 diabetes mellitus with other circulatory complications: Secondary | ICD-10-CM

## 2020-09-08 DIAGNOSIS — R079 Chest pain, unspecified: Secondary | ICD-10-CM

## 2020-09-08 DIAGNOSIS — I152 Hypertension secondary to endocrine disorders: Secondary | ICD-10-CM | POA: Diagnosis not present

## 2020-09-08 LAB — ECHOCARDIOGRAM COMPLETE
Area-P 1/2: 2.91 cm2
S' Lateral: 3 cm

## 2020-09-09 ENCOUNTER — Telehealth: Payer: Self-pay | Admitting: Internal Medicine

## 2020-09-09 LAB — BASIC METABOLIC PANEL
BUN/Creatinine Ratio: 17 (ref 9–23)
BUN: 13 mg/dL (ref 6–24)
CO2: 26 mmol/L (ref 20–29)
Calcium: 10.3 mg/dL — ABNORMAL HIGH (ref 8.7–10.2)
Chloride: 98 mmol/L (ref 96–106)
Creatinine, Ser: 0.75 mg/dL (ref 0.57–1.00)
GFR calc Af Amer: 108 mL/min/{1.73_m2} (ref >59)
GFR calc non Af Amer: 94 mL/min/{1.73_m2} (ref >59)
Glucose: 115 mg/dL — ABNORMAL HIGH (ref 65–99)
Potassium: 5.4 mmol/L — ABNORMAL HIGH (ref 3.5–5.2)
Sodium: 137 mmol/L (ref 134–144)

## 2020-09-09 LAB — MAGNESIUM: Magnesium: 2 mg/dL (ref 1.6–2.3)

## 2020-09-09 NOTE — Telephone Encounter (Signed)
Left detailed message about potassium elevation and what foods to avoid while waiting for MD to review the lab.

## 2020-09-09 NOTE — Telephone Encounter (Signed)
° °  Pt is returning call to get lab results. She said if she unable to answer call please leave a detailed message

## 2020-09-14 ENCOUNTER — Telehealth: Payer: Self-pay | Admitting: Internal Medicine

## 2020-09-14 ENCOUNTER — Telehealth: Payer: Self-pay

## 2020-09-14 NOTE — Telephone Encounter (Signed)
    Pt is returning call, she said she is with patient as well and will try to cb in the afternoon

## 2020-09-14 NOTE — Telephone Encounter (Signed)
    Pt is calling back to follow up, she said to call her back if she did not able to answer her phone to leave her a detailed message

## 2020-09-14 NOTE — Telephone Encounter (Signed)
The patient has been notified of the result and verbalized understanding.  All questions (if any) were answered. Leanord Hawking, RN 09/14/2020 2:59 PM

## 2020-09-14 NOTE — Telephone Encounter (Signed)
Patient made aware of both echo and lab results.   Advised to take BP 2-3x per day for the next 3 days and give [place] a call on Friday with those readings. Will wait for readings before potentially increasing HCTZ per Dr. Izora Ribas (see result note).

## 2020-09-14 NOTE — Telephone Encounter (Signed)
-----   Message from Christell Constant, MD sent at 09/10/2020 11:34 AM EST ----- Results: Stable Cr, Elevated K Plan: If BP above 135/85, increase HCTZ to 25 mg PO Daily and get BMP again in 7-10 days If < 135/85 or not checked, will continue current therapy (potassium counseling.  Christell Constant, MD

## 2020-09-17 ENCOUNTER — Telehealth (HOSPITAL_COMMUNITY): Payer: Self-pay | Admitting: Emergency Medicine

## 2020-09-17 NOTE — Telephone Encounter (Signed)
Attempted to call patient regarding upcoming cardiac CT appointment. °Left message on voicemail with name and callback number °Winnell Bento RN Navigator Cardiac Imaging °[facility] °[phone] Office °[phone] Cell ° °

## 2020-09-21 ENCOUNTER — Ambulatory Visit (HOSPITAL_COMMUNITY)
Admission: RE | Admit: 2020-09-21 | Discharge: 2020-09-21 | Disposition: A | Discharge status: Home / Self Care | Payer: BC Managed Care – PPO | Source: Ambulatory Visit | Attending: Internal Medicine | Admitting: Internal Medicine

## 2020-09-21 ENCOUNTER — Other Ambulatory Visit: Payer: Self-pay

## 2020-09-21 DIAGNOSIS — R0609 Other forms of dyspnea: Secondary | ICD-10-CM

## 2020-09-21 DIAGNOSIS — Z006 Encounter for examination for normal comparison and control in clinical research program: Secondary | ICD-10-CM

## 2020-09-21 DIAGNOSIS — R072 Precordial pain: Secondary | ICD-10-CM

## 2020-09-21 DIAGNOSIS — I152 Hypertension secondary to endocrine disorders: Secondary | ICD-10-CM

## 2020-09-21 DIAGNOSIS — R0600 Dyspnea, unspecified: Secondary | ICD-10-CM | POA: Diagnosis not present

## 2020-09-21 DIAGNOSIS — Z8249 Family history of ischemic heart disease and other diseases of the circulatory system: Secondary | ICD-10-CM

## 2020-09-21 DIAGNOSIS — R079 Chest pain, unspecified: Secondary | ICD-10-CM

## 2020-09-21 DIAGNOSIS — E1169 Type 2 diabetes mellitus with other specified complication: Secondary | ICD-10-CM | POA: Insufficient documentation

## 2020-09-21 DIAGNOSIS — E1159 Type 2 diabetes mellitus with other circulatory complications: Secondary | ICD-10-CM | POA: Insufficient documentation

## 2020-09-21 DIAGNOSIS — E785 Hyperlipidemia, unspecified: Secondary | ICD-10-CM

## 2020-09-21 MED ORDER — IOHEXOL 350 MG/ML SOLN
80.0000 mL | Freq: Once | INTRAVENOUS | Status: AC | PRN
  Administered 2020-09-21: 80 mL via INTRAVENOUS

## 2020-09-21 MED ORDER — NITROGLYCERIN 0.4 MG SL SUBL
0.8000 mg | SUBLINGUAL_TABLET | Freq: Once | SUBLINGUAL | Status: AC
  Administered 2020-09-21: 0.8 mg via SUBLINGUAL

## 2020-09-21 MED ORDER — NITROGLYCERIN 0.4 MG SL SUBL
SUBLINGUAL_TABLET | SUBLINGUAL | Status: AC
  Filled 2020-09-21: qty 2

## 2020-09-21 NOTE — Research (Signed)
IDENTIFY Informed Consent   Subject Name: Jessica Chang  Subject met inclusion and exclusion criteria.  The informed consent form, study requirements and expectations were reviewed with the subject and questions and concerns were addressed prior to the signing of the consent form.  The subject verbalized understanding of the trial requirements.  The subject agreed to participate in the IDENTIFY trial and signed the informed consent at 1400 on 03/JAN/2022.  The informed consent was obtained prior to performance of any protocol-specific procedures for the subject.  A copy of the signed informed consent was given to the subject and a copy was placed in the subject's medical record.   Sherlyn Lees

## 2020-09-22 ENCOUNTER — Ambulatory Visit (HOSPITAL_COMMUNITY): Payer: BC Managed Care – PPO

## 2020-09-22 DIAGNOSIS — R072 Precordial pain: Secondary | ICD-10-CM | POA: Diagnosis not present

## 2020-09-23 ENCOUNTER — Institutional Professional Consult (permissible substitution): Payer: BC Managed Care – PPO | Admitting: Pulmonary Disease

## 2020-09-23 NOTE — Telephone Encounter (Signed)
Please see patient mychart message  I was initially referred by ED doc. I am currently seeing Cardiologist and are still working up testing and diagnosis. My SOB is certainly related to heart issues but are minimal. I would like to hold consult for now until further cardiac diagnosis and referral from cardiologist if needed.   Thank you and am very sorry for the late notice!

## 2020-09-23 NOTE — Progress Notes (Incomplete)
Synopsis: Referred in 09/2020 for shortness of breath s/p Covid 04/2020  Subjective:   PATIENT ID: Jessica Chang GENDER: female DOB: 1971/06/03, MRN: [id number]   HPI  No chief complaint on file.   Jessica Chang is a 50 year old woman, former smoker with diabetes who is referred to pulmonary clinic for evaluation of shortness of breath after Covid 19 in 04/2020.     Past Medical History:  Diagnosis Date  . Acute pancreatitis   . Allergy    seasonal   . Arthritis    2021 right hip  . Cataract    2020  . COVID   . Diabetes ([place])   . Gallstones   . Hyperlipidemia   . Hypertension   . Pneumonia   . Squamous cell carcinoma    skin-sx on 2021  . Thyroid disease    2016     Family History  Problem Relation Age of Onset  . Heart disease Mother   . Basal cell carcinoma Mother   . Diabetes Father   . Hyperlipidemia Father   . Hypertension Father   . Colon polyps Father 44  . Prostate cancer Father 42  . Melanoma Father   . Aneurysm Father   . AAA (abdominal aortic aneurysm) Father 11       Thoracic aorta repair and AVR  . Alcohol abuse Sister   . Mental illness Sister   . Asthma Sister   . Urticaria Sister   . Allergic rhinitis Sister   . Heart attack Paternal Aunt   . Melanoma Paternal Aunt   . Breast cancer Maternal Grandmother   . AAA (abdominal aortic aneurysm) Maternal Grandfather   . Diabetes Paternal Grandmother   . Stroke Paternal Grandfather   . Melanoma Paternal Grandfather   . Colon cancer Neg Hx   . Esophageal cancer Neg Hx   . Rectal cancer Neg Hx   . Stomach cancer Neg Hx      Social History   Socioeconomic History  . Marital status: Married    Spouse name: Not on file  . Number of children: 1  . Years of education: Not on file  . Highest education level: Not on file  Occupational History  . Occupation: physical therapy  Tobacco Use  . Smoking status: Former Smoker    Packs/day: 0.25    Years: 5.00    Pack years: 1.25    Quit  date: 09/19/2005    Years since quitting: 15.0  . Smokeless tobacco: Never Used  . Tobacco comment: Quit 2007  Vaping Use  . Vaping Use: Never used  Substance and Sexual Activity  . Alcohol use: Yes    Comment: occassionally  . Drug use: No  . Sexual activity: Yes    Birth control/protection: I.U.D.  Other Topics Concern  . Not on file  Social History Narrative  . Not on file   Social Determinants of Health   Financial Resource Strain: Not on file  Food Insecurity: Not on file  Transportation Needs: Not on file  Physical Activity: Not on file  Stress: Not on file  Social Connections: Not on file  Intimate Partner Violence: Not on file     Allergies  Allergen Reactions  . Januvia [Sitagliptin] Other (See Comments)    Pancreatitis  . Onglyza [Saxagliptin] Other (See Comments)    Pancreatitis to all DPP 4 inhibitor medications  . Cherry Swelling and Itching  . Lidocaine     Other reaction(s): Other (See Comments) Blisters  .  Peach [Prunus Persica] Other (See Comments)    Facial tingling     Outpatient Medications Prior to Visit  Medication Sig Dispense Refill  . atorvastatin (LIPITOR) 10 MG tablet Take 10 mg by mouth daily.    . cetirizine (ZYRTEC) 10 MG tablet Take 10 mg by mouth daily.    . Cholecalciferol (VITAMIN D3) 5000 units TABS 5,000 IU OTC vitamin D3 daily. 90 tablet 3  . EPINEPHrine 0.3 mg/0.3 mL IJ SOAJ injection Inject 0.3 mLs (0.3 mg total) into the muscle as needed for anaphylaxis. 2 each 1  . FLUoxetine (PROZAC) 40 MG capsule TAKE 1 CAPSULE BY MOUTH EVERY DAY 90 capsule 1  . glimepiride (AMARYL) 2 MG tablet Take 2 mg by mouth daily.    Marland Kitchen glucose blood test strip Check 2 hour postprandial and fasting blood sugars daily; also if you feel poorly 100 each 12  . hydrochlorothiazide (MICROZIDE) 12.5 MG capsule Take 1 capsule (12.5 mg total) by mouth daily. 90 capsule 1  . hydrocortisone 2.5 % cream Apply 1 application topically daily as needed (skin  irritation).     . Ibuprofen 200 MG CAPS Take 600 mg by mouth daily as needed (pain).     Marland Kitchen levonorgestrel (MIRENA) 20 MCG/24HR IUD 1 each by Intrauterine route continuous.     Marland Kitchen levothyroxine (SYNTHROID) 88 MCG tablet Take 88 mcg by mouth daily before breakfast.     . lisinopril (PRINIVIL,ZESTRIL) 20 MG tablet Take 20 mg by mouth daily.  6  . metFORMIN (GLUCOPHAGE-XR) 500 MG 24 hr tablet Take 1,000 tablets by mouth in the morning and at bedtime.     . metoprolol tartrate (LOPRESSOR) 100 MG tablet Take 1 tablet (100 mg total) by mouth once for 1 dose. Take 2 hours prior to your Coronary CT. 1 tablet 0  . Multiple Vitamins-Minerals (HAIR/SKIN/NAILS/BIOTIN PO) Take 1 tablet by mouth daily.      No facility-administered medications prior to visit.    ROS    Objective:  There were no vitals filed for this visit.   Physical Exam    CBC    Component Value Date/Time   WBC 7.1 08/18/2020 1022   RBC 4.13 08/18/2020 1022   HGB 12.5 08/18/2020 1022   HGB 13.1 06/11/2020 1602   HCT 37.1 08/18/2020 1022   HCT 38.3 06/11/2020 1602   PLT 279 08/18/2020 1022   PLT 327 06/11/2020 1602   MCV 89.8 08/18/2020 1022   MCV 89 06/11/2020 1602   MCH 30.3 08/18/2020 1022   MCHC 33.7 08/18/2020 1022   RDW 12.1 08/18/2020 1022   RDW 12.3 06/11/2020 1602   LYMPHSABS 2.8 08/18/2020 1022   LYMPHSABS 2.6 06/16/2017 0850   MONOABS 0.5 08/18/2020 1022   EOSABS 0.2 08/18/2020 1022   EOSABS 0.2 06/16/2017 0850   BASOSABS 0.0 08/18/2020 1022   BASOSABS 0.0 06/16/2017 0850   BMP Latest Ref Rng & Units 09/08/2020 08/18/2020 10/11/2018  Glucose 65 - 99 mg/dL 182(X) 937(J) -  BUN 6 - 24 mg/dL 13 17 15   Creatinine 0.57 - 1.00 mg/dL 6.96 0.8  BUN/Creat Ratio 9 - 23 17 - -  Sodium 134 - 144 mmol/L 137 134(L) 139  Potassium 3.5 - 5.2 mmol/L 5.4(H) 4.4 5.0  Chloride 96 - 106 mmol/L 98 100 -  CO2 20 - 29 mmol/L 26 24 -  Calcium 8.7 - 10.2 mg/dL 10.3(H) 9.9 -   Chest imaging: CT Coronary  09/21/2020 Within the visualized portions of the thorax there are  no suspicious appearing pulmonary nodules or masses, there is no acute consolidative airspace disease, no pleural effusions, no pneumothorax and no lymphadenopathy. Visualized portions of the upper abdomen are unremarkable. There are no aggressive appearing lytic or blastic lesions noted in the visualized portions of the Skeleton.  CXR 08/18/20 The heart size and mediastinal contours are within normal limits. Both lungs are clear. The visualized skeletal structures are unremarkable. Overlying EKG leads. Partially imaged cervical ACDF. Cholecystectomy clips.  PFT: No flowsheet data found.  Echo: 09/08/20 1. Left ventricular ejection fraction, by estimation, is 60 to 65%. Left  ventricular ejection fraction by 3D volume is 60 %. The left ventricle has  normal function. The left ventricle has no regional wall motion  abnormalities. Left ventricular diastolic  parameters were normal. The average left ventricular global longitudinal  strain is -20.2 %. The global longitudinal strain is normal.  2. Right ventricular systolic function is normal. The right ventricular  size is normal. Tricuspid regurgitation signal is inadequate for assessing  PA pressure.  3. The mitral valve is grossly normal. Trivial mitral valve  regurgitation. No evidence of mitral stenosis.  4. The aortic valve is tricuspid. Aortic valve regurgitation is not  visualized. No aortic stenosis is present.  5. There is mild dilatation of the ascending aorta, measuring 37 mm.  6. The inferior vena cava is normal in size with greater than 50%  respiratory variability, suggesting right atrial pressure of 3 mmHg.    Assessment & Plan:   Shortness of breath  Discussion: ***    Current Outpatient Medications:  .  atorvastatin (LIPITOR) 10 MG tablet, Take 10 mg by mouth daily., Disp: , Rfl:  .  cetirizine (ZYRTEC) 10 MG tablet, Take 10 mg by mouth  daily., Disp: , Rfl:  .  Cholecalciferol (VITAMIN D3) 5000 units TABS, 5,000 IU OTC vitamin D3 daily., Disp: 90 tablet, Rfl: 3 .  EPINEPHrine 0.3 mg/0.3 mL IJ SOAJ injection, Inject 0.3 mLs (0.3 mg total) into the muscle as needed for anaphylaxis., Disp: 2 each, Rfl: 1 .  FLUoxetine (PROZAC) 40 MG capsule, TAKE 1 CAPSULE BY MOUTH EVERY DAY, Disp: 90 capsule, Rfl: 1 .  glimepiride (AMARYL) 2 MG tablet, Take 2 mg by mouth daily., Disp: , Rfl:  .  glucose blood test strip, Check 2 hour postprandial and fasting blood sugars daily; also if you feel poorly, Disp: 100 each, Rfl: 12 .  hydrochlorothiazide (MICROZIDE) 12.5 MG capsule, Take 1 capsule (12.5 mg total) by mouth daily., Disp: 90 capsule, Rfl: 1 .  hydrocortisone 2.5 % cream, Apply 1 application topically daily as needed (skin irritation). , Disp: , Rfl:  .  Ibuprofen 200 MG CAPS, Take 600 mg by mouth daily as needed (pain). , Disp: , Rfl:  .  levonorgestrel (MIRENA) 20 MCG/24HR IUD, 1 each by Intrauterine route continuous. , Disp: , Rfl:  .  levothyroxine (SYNTHROID) 88 MCG tablet, Take 88 mcg by mouth daily before breakfast. , Disp: , Rfl:  .  lisinopril (PRINIVIL,ZESTRIL) 20 MG tablet, Take 20 mg by mouth daily., Disp: , Rfl: 6 .  metFORMIN (GLUCOPHAGE-XR) 500 MG 24 hr tablet, Take 1,000 tablets by mouth in the morning and at bedtime. , Disp: , Rfl:  .  metoprolol tartrate (LOPRESSOR) 100 MG tablet, Take 1 tablet (100 mg total) by mouth once for 1 dose. Take 2 hours prior to your Coronary CT., Disp: 1 tablet, Rfl: 0 .  Multiple Vitamins-Minerals (HAIR/SKIN/NAILS/BIOTIN PO), Take 1 tablet by mouth daily. ,  Disp: , Rfl:

## 2020-09-30 ENCOUNTER — Telehealth: Payer: Self-pay | Admitting: Internal Medicine

## 2020-09-30 DIAGNOSIS — I77810 Thoracic aortic ectasia: Secondary | ICD-10-CM

## 2020-09-30 NOTE — Telephone Encounter (Signed)
Patient calling to reschedule her cancelled appt with Dr. Gasper Sells in March but states she wants to have another echo done prior to that appt. Please advise.

## 2020-09-30 NOTE — Telephone Encounter (Signed)
Jessica Lean, MD  09/11/2020 11:09 AM EST      Results: No WMA, Borderline AA, Index aorta < 2.34 Plan: Echo in 6 months (indication TAA)  Jessica Lean, MD   Placed order for echo.  Forward to scheduling pool for appointment and echo.

## 2020-10-13 DIAGNOSIS — D443 Neoplasm of uncertain behavior of pituitary gland: Secondary | ICD-10-CM | POA: Diagnosis not present

## 2020-10-13 DIAGNOSIS — E7800 Pure hypercholesterolemia, unspecified: Secondary | ICD-10-CM | POA: Diagnosis not present

## 2020-10-13 DIAGNOSIS — E119 Type 2 diabetes mellitus without complications: Secondary | ICD-10-CM | POA: Diagnosis not present

## 2020-10-20 DIAGNOSIS — E039 Hypothyroidism, unspecified: Secondary | ICD-10-CM | POA: Diagnosis not present

## 2020-10-20 DIAGNOSIS — R945 Abnormal results of liver function studies: Secondary | ICD-10-CM | POA: Diagnosis not present

## 2020-10-20 DIAGNOSIS — E119 Type 2 diabetes mellitus without complications: Secondary | ICD-10-CM | POA: Diagnosis not present

## 2020-10-20 DIAGNOSIS — I10 Essential (primary) hypertension: Secondary | ICD-10-CM | POA: Diagnosis not present

## 2020-11-23 ENCOUNTER — Encounter: Payer: Self-pay | Admitting: Internal Medicine

## 2020-11-25 ENCOUNTER — Ambulatory Visit: Payer: BC Managed Care – PPO | Admitting: Internal Medicine

## 2020-11-26 ENCOUNTER — Telehealth: Payer: Self-pay

## 2020-11-26 NOTE — Telephone Encounter (Signed)
Left message for patient to pls call in and advise of her address as we have returned mail...advised her of her change of appt time and date.   VB 11-26-2020

## 2020-12-10 ENCOUNTER — Ambulatory Visit: Payer: BC Managed Care – PPO | Admitting: Internal Medicine

## 2020-12-11 ENCOUNTER — Encounter: Payer: Self-pay | Admitting: Internal Medicine

## 2020-12-11 ENCOUNTER — Ambulatory Visit: Payer: BC Managed Care – PPO | Admitting: Internal Medicine

## 2020-12-11 ENCOUNTER — Other Ambulatory Visit: Payer: Self-pay

## 2020-12-11 VITALS — BP 120/80 | HR 82 | Ht 67.0 in | Wt 181.0 lb

## 2020-12-11 DIAGNOSIS — E1159 Type 2 diabetes mellitus with other circulatory complications: Secondary | ICD-10-CM

## 2020-12-11 DIAGNOSIS — I2510 Atherosclerotic heart disease of native coronary artery without angina pectoris: Secondary | ICD-10-CM | POA: Diagnosis not present

## 2020-12-11 DIAGNOSIS — E785 Hyperlipidemia, unspecified: Secondary | ICD-10-CM

## 2020-12-11 DIAGNOSIS — I152 Hypertension secondary to endocrine disorders: Secondary | ICD-10-CM

## 2020-12-11 DIAGNOSIS — Z8249 Family history of ischemic heart disease and other diseases of the circulatory system: Secondary | ICD-10-CM

## 2020-12-11 DIAGNOSIS — E7800 Pure hypercholesterolemia, unspecified: Secondary | ICD-10-CM

## 2020-12-11 DIAGNOSIS — E1169 Type 2 diabetes mellitus with other specified complication: Secondary | ICD-10-CM

## 2020-12-11 MED ORDER — ASPIRIN EC 81 MG PO TBEC: 81.0000 mg | DELAYED_RELEASE_TABLET | Freq: Every day | ORAL | 11 refills | Status: AC

## 2020-12-11 NOTE — Patient Instructions (Signed)
Medication Instructions:  Your physician recommends that you continue on your current medications as directed. Please refer to the Current Medication list given to you today.  *If you need a refill on your cardiac medications before your next appointment, please call your pharmacy*   Lab Work: NONE If you have labs (blood work) drawn today and your tests are completely normal, you will receive your results only by: Marland Kitchen MyChart Message (if you have MyChart) OR . A paper copy in the mail If you have any lab test that is abnormal or we need to change your treatment, we will call you to review the results.   Testing/Procedures: Your physician has requested that you have an echocardiogram scheduled for 04/02/2021. Echocardiography is a painless test that uses sound waves to create images of your heart. It provides your doctor with information about the size and shape of your heart and how well your heart's chambers and valves are working. This procedure takes approximately one hour. There are no restrictions for this procedure.     Follow-Up: At [facility], you and your health needs are our priority.  As part of our continuing mission to provide you with exceptional heart care, we have created designated Provider Care Teams.  These Care Teams include your primary Cardiologist (physician) and Advanced Practice Providers (APPs -  Physician Assistants and Nurse Practitioners) who all work together to provide you with the care you need, when you need it.  We recommend signing up for the patient portal called "MyChart".  Sign up information is provided on this After Visit Summary.  MyChart is used to connect with patients for Virtual Visits (Telemedicine).  Patients are able to view lab/test results, encounter notes, upcoming appointments, etc.  Non-urgent messages can be sent to your provider as well.   To learn more about what you can do with MyChart, go to [email or web address].    Your next  appointment:   6 month(s)  The format for your next appointment:   In Person  Provider:   You may see Rudean Haskell, MD or one of the following Advanced Practice Providers on your designated Care Team:    Melina Copa, PA-C  Ermalinda Barrios, PA-C

## 2020-12-11 NOTE — Progress Notes (Signed)
Cardiology Office Note:    Date:  12/11/2020   ID:  Jessica Chang, DOB 03/31/1971, MRN [id number]  PCP:  Lorrene Reid, PA-C  [facility] Cardiologist: Rudean Haskell MD [place] Electrophysiologist:  None   CC: Nonobstructive CAD follow up  History of Present Illness:    Jessica Chang is a 50 y.o. female with a hx of Diabetes with HTN, Chronic HTN of pregnancy (circa 2007)  HLD, Prior COVID infection in August who presents for evaluation.  In interim of this visit, patient had mild non-obstructive CAD by Cardiac CT.  Patient notes that she is doing pretty good.  Since last visit notes some weight gain.   There are no interval hospital/ED visit.    No chest pain or pressure.  No SOB/DOE and no PND/Orthopnea.  No weight gain.  No palpitations or syncope.  Resting heart rate has continued to improve.  Ambulatory blood pressure 130/70.  Patient notes that her and her father has had genetic testing done at [facility]: Done by some student.  Past Medical History:  Diagnosis Date  . Acute pancreatitis   . Allergy    seasonal   . Arthritis    2021 right hip  . Cataract    2020  . COVID   . Diabetes ([place])   . Gallstones   . Hyperlipidemia   . Hypertension   . Pneumonia   . Squamous cell carcinoma    skin-sx on 2021  . Thyroid disease    2016    Past Surgical History:  Procedure Laterality Date  . CATARACT EXTRACTION  2020  . CERVICAL SPINE SURGERY    . CESAREAN SECTION  2008  . CHOLECYSTECTOMY  2005  . HIP ARTHROSCOPY Right 2011, 2016  . KNEE ARTHROSCOPY Right 1988  . SKIN CANCER EXCISION  2021  . TONSILECTOMY, ADENOIDECTOMY, BILATERAL MYRINGOTOMY AND TUBES  1989  . TONSILLECTOMY  1989  . WISDOM TOOTH EXTRACTION  1990    Current Medications: Current Meds  Medication Sig  . aspirin EC 81 MG tablet Take 1 tablet (81 mg total) by mouth daily. Swallow whole.  . cetirizine (ZYRTEC) 10 MG tablet Take 10 mg by mouth daily.  . Cholecalciferol  (VITAMIN D3) 5000 units TABS 5,000 IU OTC vitamin D3 daily.  Marland Kitchen EPINEPHrine 0.3 mg/0.3 mL IJ SOAJ injection Inject 0.3 mLs (0.3 mg total) into the muscle as needed for anaphylaxis.  Marland Kitchen FLUoxetine (PROZAC) 40 MG capsule TAKE 1 CAPSULE BY MOUTH EVERY DAY  . glimepiride (AMARYL) 2 MG tablet Take 2 mg by mouth daily.  Marland Kitchen glucose blood test strip Check 2 hour postprandial and fasting blood sugars daily; also if you feel poorly  . hydrochlorothiazide (MICROZIDE) 12.5 MG capsule Take 1 capsule (12.5 mg total) by mouth daily.  . hydrocortisone 2.5 % cream Apply 1 application topically daily as needed (skin irritation).   . Ibuprofen 200 MG CAPS Take 600 mg by mouth daily as needed (pain).   Marland Kitchen levonorgestrel (MIRENA) 20 MCG/24HR IUD 1 each by Intrauterine route continuous.   Marland Kitchen levothyroxine (SYNTHROID) 88 MCG tablet Take 88 mcg by mouth daily before breakfast.   . lisinopril (PRINIVIL,ZESTRIL) 20 MG tablet Take 20 mg by mouth daily.  . metFORMIN (GLUCOPHAGE-XR) 500 MG 24 hr tablet Take 1,000 tablets by mouth in the morning and at bedtime.   . Multiple Vitamins-Minerals (HAIR/SKIN/NAILS/BIOTIN PO) Take 1 tablet by mouth daily.   . rosuvastatin (CRESTOR) 10 MG tablet Take 10 mg by mouth daily.  Allergies:   Januvia [sitagliptin], Onglyza [saxagliptin], Cherry, Lidocaine, Peach [prunus persica], and Saxagliptin-metformin er   Social History   Socioeconomic History  . Marital status: Married    Spouse name: Not on file  . Number of children: 1  . Years of education: Not on file  . Highest education level: Not on file  Occupational History  . Occupation: physical therapy  Tobacco Use  . Smoking status: Former Smoker    Packs/day: 0.25    Years: 5.00    Pack years: 1.25    Quit date: 09/19/2005    Years since quitting: 15.2  . Smokeless tobacco: Never Used  . Tobacco comment: Quit 2007  Vaping Use  . Vaping Use: Never used  Substance and Sexual Activity  . Alcohol use: Yes    Comment:  occassionally  . Drug use: No  . Sexual activity: Yes    Birth control/protection: I.U.D.  Other Topics Concern  . Not on file  Social History Narrative  . 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  Family History: The patient's family history includes AAA (abdominal aortic aneurysm) in her maternal grandfather; AAA (abdominal aortic aneurysm) (age of onset: 29) in her father; Alcohol abuse in her sister; Allergic rhinitis in her sister; Aneurysm in her father; Asthma in her sister; Basal cell carcinoma in her mother; Breast cancer in her maternal grandmother; Colon polyps (age of onset: 30) in her father; Diabetes in her father and paternal grandmother; Heart attack in her paternal aunt; Heart disease in her mother; Hyperlipidemia in her father; Hypertension in her father; Melanoma in her father, paternal aunt, and paternal grandfather; Mental illness in her sister; Prostate cancer (age of onset: 61) in her father; Stroke in her paternal grandfather; Urticaria in her sister. There is no history of Colon cancer, Esophageal cancer, Rectal cancer, or Stomach cancer. Father had Bental Procedure: for thoracic aortic aneursym. Mother had HFrecoveredEF  ROS:   Please see the history of present illness.    All other systems reviewed and are negative.  EKGs/Labs/Other Studies Reviewed:    The following studies were reviewed today:  EKG:   08/19/20:  SR 91 WNL 08/27/20 SR 80 WNL  Transthoracic Echocardiogram: Date: Aorta ULN Results: 1. Left ventricular ejection fraction, by estimation, is 60 to 65%. Left  ventricular ejection fraction by 3D volume is 60 %. The left ventricle has  normal function. The left ventricle has no regional wall motion   abnormalities. Left ventricular diastolic  parameters were normal. The average left ventricular global longitudinal  strain is -20.2 %. The global longitudinal strain is normal.  2. Right ventricular systolic function is normal. The right ventricular  size is normal. Tricuspid regurgitation signal is inadequate for assessing  PA pressure.  3. The mitral valve is grossly normal. Trivial mitral valve  regurgitation. No evidence of mitral stenosis.  4. The aortic valve is tricuspid. Aortic valve regurgitation is not  visualized. No aortic stenosis is present.  5. There is mild dilatation of the ascending aorta, measuring 37 mm.  6. The inferior vena cava is normal in size with greater than 50%  respiratory variability, suggesting right atrial pressure of 3 mmHg.    Cardiac CT: Date:09/21/20 Results:  LAD system: Mixed plaque proximal and mid LAD, mild (<50%) stenosis.  Circumflex system: Mixed plaque proximal LCx, mild (<50%) stenosis.  RCA system: Mixed plaque noted in the proximal, mid, and distal RCA. Stenosis appears mild (<50%).  IMPRESSION: No evidence for hemodynamically significant coronary disease.  Loralie Champagne    Recent Labs: 08/18/2020: ALT 43; Hemoglobin 12.5; Platelets 279 09/08/2020: BUN 13; Creatinine, Ser 0.75; Magnesium 2.0; Potassium 5.4; Sodium 137  Recent Lipid Panel    Component Value Date/Time   CHOL 186 10/11/2018 0000   CHOL 177 06/16/2017 0850   TRIG 122 10/11/2018 0000   HDL 46 10/11/2018 0000   HDL 45 06/16/2017 0850   CHOLHDL 3.9 06/16/2017 0850   CHOLHDL 4.0 05/20/2016 0815   VLDL 28 05/20/2016 0815   LDLCALC 116 10/11/2018 0000   LDLCALC 108 (H) 06/16/2017 0850    Risk Assessment/Calculations:     N/A  Physical Exam:    VS:  BP 120/80   Pulse 82   Ht 5\' 7"  (1.702 m)   Wt 181 lb (82.1 kg)   SpO2 99%   BMI 28.35 kg/m     Wt Readings from Last 3 Encounters:  12/11/20 181 lb (82.1 kg)  08/27/20 174 lb (78.9 kg)   08/18/20 171 lb (77.6 kg)     GEN:  Well nourished, well developed in no acute distress HEENT: Normal NECK: No JVD; No carotid bruits LYMPHATICS: No lymphadenopathy CARDIAC: RRR, no murmurs, rubs, gallops RESPIRATORY:  Clear to auscultation without rales, wheezing or rhonchi  ABDOMEN: Soft, non-tender, non-distended MUSCULOSKELETAL:  No edema; No deformity  SKIN: Warm and dry NEUROLOGIC:  Alert and oriented x 3 PSYCHIATRIC:  Normal affect   ASSESSMENT:    1. Coronary artery disease involving native coronary artery of native heart without angina pectoris   2. Family history of aortic aneurysm   3. Hypertension associated with diabetes ([place])   4. Hypercholesterolemia    PLAN:    In order of problems listed above:  Coronary Artery Disease Nonobstructive - asymptomatic  - anatomy: mild scattered calcifications all vessels - start ASA 81 mg - continue statin, goal LDL < 70 (recent rosuvastatin 10 mg) - reviewed CT with patient  Family history of Thoracic and Abdominal Aortic Aneurysm - ULN diameter in 1/ 2022; will repeat echo in 6 months  Diabetes with Hypertension HLD - ambulatory blood pressure 130/70, will continue ambulatory BP monitoring; gave education on how to perform ambulatory blood pressure monitoring including the frequency and technique; goal ambulatory blood pressure < 135/85 on average - continue home medications - discussed diet (DASH/low sodium), and exercise/weight loss interventions    3 month follow up unless new symptoms or abnormal test results warranting change in plan  Would be reasonable for  APP Follow up   Medication Adjustments/Labs and Tests Ordered: Current medicines are reviewed at length with the patient today.  Concerns regarding medicines are outlined above.  No orders of the defined types were placed in this encounter.  Meds ordered this encounter  Medications  . aspirin EC 81 MG tablet    Sig: Take 1 tablet (81 mg total) by  mouth daily. Swallow whole.    Dispense:  30 tablet    Refill:  11    Patient Instructions  Medication Instructions:  Your physician recommends that you continue on your current medications as directed. Please refer to the Current Medication list given to you today.  *If you need a refill on your cardiac medications before your  next appointment, please call your pharmacy*   Lab Work: NONE If you have labs (blood work) drawn today and your tests are completely normal, you will receive your results only by: Marland Kitchen MyChart Message (if you have MyChart) OR . A paper copy in the mail If you have any lab test that is abnormal or we need to change your treatment, we will call you to review the results.   Testing/Procedures: Your physician has requested that you have an echocardiogram scheduled for 04/02/2021. Echocardiography is a painless test that uses sound waves to create images of your heart. It provides your doctor with information about the size and shape of your heart and how well your heart's chambers and valves are working. This procedure takes approximately one hour. There are no restrictions for this procedure.     Follow-Up: At [facility], you and your health needs are our priority.  As part of our continuing mission to provide you with exceptional heart care, we have created designated Provider Care Teams.  These Care Teams include your primary Cardiologist (physician) and Advanced Practice Providers (APPs -  Physician Assistants and Nurse Practitioners) who all work together to provide you with the care you need, when you need it.  We recommend signing up for the patient portal called "MyChart".  Sign up information is provided on this After Visit Summary.  MyChart is used to connect with patients for Virtual Visits (Telemedicine).  Patients are able to view lab/test results, encounter notes, upcoming appointments, etc.  Non-urgent messages can be sent to your provider as well.    To learn more about what you can do with MyChart, go to [email or web address].    Your next appointment:   6 month(s)  The format for your next appointment:   In Person  Provider:   You may see Rudean Haskell, MD or one of the following Advanced Practice Providers on your designated Care Team:    Melina Copa, PA-C  Ermalinda Barrios, PA-C          Signed, Werner Lean, MD  12/11/2020 3:09 PM    [place]

## 2020-12-17 ENCOUNTER — Telehealth: Payer: Self-pay | Admitting: Physician Assistant

## 2020-12-17 ENCOUNTER — Other Ambulatory Visit: Payer: Self-pay | Admitting: Physician Assistant

## 2020-12-17 DIAGNOSIS — F4320 Adjustment disorder, unspecified: Secondary | ICD-10-CM

## 2020-12-17 NOTE — Telephone Encounter (Signed)
Left voicemail for patient

## 2020-12-17 NOTE — Telephone Encounter (Signed)
Please contact patient to schedule per last AVS for further medication refills. AS, CMA 

## 2020-12-24 DIAGNOSIS — E7800 Pure hypercholesterolemia, unspecified: Secondary | ICD-10-CM | POA: Diagnosis not present

## 2020-12-29 ENCOUNTER — Encounter: Payer: Self-pay | Admitting: Physician Assistant

## 2020-12-29 ENCOUNTER — Other Ambulatory Visit: Payer: Self-pay

## 2020-12-29 ENCOUNTER — Ambulatory Visit: Payer: BC Managed Care – PPO | Admitting: Physician Assistant

## 2020-12-29 VITALS — BP 105/70 | HR 69 | Temp 97.4°F | Ht 67.0 in | Wt 187.7 lb

## 2020-12-29 DIAGNOSIS — E229 Hyperfunction of pituitary gland, unspecified: Secondary | ICD-10-CM | POA: Insufficient documentation

## 2020-12-29 DIAGNOSIS — E079 Disorder of thyroid, unspecified: Secondary | ICD-10-CM

## 2020-12-29 DIAGNOSIS — I2510 Atherosclerotic heart disease of native coronary artery without angina pectoris: Secondary | ICD-10-CM

## 2020-12-29 DIAGNOSIS — F4320 Adjustment disorder, unspecified: Secondary | ICD-10-CM | POA: Diagnosis not present

## 2020-12-29 DIAGNOSIS — E1169 Type 2 diabetes mellitus with other specified complication: Secondary | ICD-10-CM

## 2020-12-29 DIAGNOSIS — E0800 Diabetes mellitus due to underlying condition with hyperosmolarity without nonketotic hyperglycemic-hyperosmolar coma (NKHHC): Secondary | ICD-10-CM

## 2020-12-29 DIAGNOSIS — R945 Abnormal results of liver function studies: Secondary | ICD-10-CM | POA: Insufficient documentation

## 2020-12-29 DIAGNOSIS — E119 Type 2 diabetes mellitus without complications: Secondary | ICD-10-CM | POA: Insufficient documentation

## 2020-12-29 DIAGNOSIS — E785 Hyperlipidemia, unspecified: Secondary | ICD-10-CM

## 2020-12-29 DIAGNOSIS — K859 Acute pancreatitis without necrosis or infection, unspecified: Secondary | ICD-10-CM | POA: Insufficient documentation

## 2020-12-29 DIAGNOSIS — E1159 Type 2 diabetes mellitus with other circulatory complications: Secondary | ICD-10-CM

## 2020-12-29 DIAGNOSIS — I152 Hypertension secondary to endocrine disorders: Secondary | ICD-10-CM

## 2020-12-29 DIAGNOSIS — A059 Bacterial foodborne intoxication, unspecified: Secondary | ICD-10-CM | POA: Insufficient documentation

## 2020-12-29 MED ORDER — FLUOXETINE HCL 40 MG PO CAPS: 40.0000 mg | ORAL_CAPSULE | Freq: Every day | ORAL | 1 refills | Status: DC

## 2020-12-29 NOTE — Patient Instructions (Signed)

## 2020-12-29 NOTE — Progress Notes (Signed)
Established Patient Office Visit  Subjective:  Patient ID: Jessica Chang, female    DOB: 1971-04-26  Age: 50 y.o. MRN: [id number]  CC:  Chief Complaint  Patient presents with  . Hypertension  . Hyperlipidemia  . Diabetes    HPI Jessica Chang presents for follow up on diabetes mellitus, hyperlipidemia, hypertension and mood management.  Diabetes: Pt is followed by endocrinology. Reports medication compliance. States has not been as diligent with monitoring diabetes due to cardiac concerns. States last A1c was 7.3.   HTN: Pt denies chest pain, palpitations, dizziness or lower extremity swelling. States chest discomfort has improved and not as prominent as before. Patient went to ED 08/18/2020 for atypical chest pain and shortness of breath. Taking medication as directed without side effects. Checks BP at home and readings range <130/80. Pt reports good hydration.  HLD: Pt is followed by Endocrinology. Reports statin was recently changed to Crestor 10 mg. States endocrinologist wants LDL goal of <50. Reports went off track with heart healthy diet in December but is back on track.  Mood: Patient reports medication compliance. Denies mood changes. Has no concerns.  Past Medical History:  Diagnosis Date  . Acute pancreatitis   . Allergy    seasonal   . Arthritis    2021 right hip  . Cataract    2020  . COVID   . Diabetes ([place])   . Gallstones   . Hyperlipidemia   . Hypertension   . Pneumonia   . Squamous cell carcinoma    skin-sx on 2021  . Thyroid disease    2016    Past Surgical History:  Procedure Laterality Date  . CATARACT EXTRACTION  2020  . CERVICAL SPINE SURGERY    . CESAREAN SECTION  2008  . CHOLECYSTECTOMY  2005  . HIP ARTHROSCOPY Right 2011, 2016  . KNEE ARTHROSCOPY Right 1988  . SKIN CANCER EXCISION  2021  . TONSILECTOMY, ADENOIDECTOMY, BILATERAL MYRINGOTOMY AND TUBES  1989  . TONSILLECTOMY  1989  . WISDOM TOOTH EXTRACTION  1990    Family History   Problem Relation Age of Onset  . Heart disease Mother   . Basal cell carcinoma Mother   . Diabetes Father   . Hyperlipidemia Father   . Hypertension Father   . Colon polyps Father 29  . Prostate cancer Father 9  . Melanoma Father   . Aneurysm Father   . AAA (abdominal aortic aneurysm) Father 86       Thoracic aorta repair and AVR  . Alcohol abuse Sister   . Mental illness Sister   . Asthma Sister   . Urticaria Sister   . Allergic rhinitis Sister   . Heart attack Paternal Aunt   . Melanoma Paternal Aunt   . Breast cancer Maternal Grandmother   . AAA (abdominal aortic aneurysm) Maternal Grandfather   . Diabetes Paternal Grandmother   . Stroke Paternal Grandfather   . Melanoma Paternal Grandfather   . Colon cancer Neg Hx   . Esophageal cancer Neg Hx   . Rectal cancer Neg Hx   . Stomach cancer Neg Hx     Social History   Socioeconomic History  . Marital status: Married    Spouse name: Not on file  . Number of children: 1  . Years of education: Not on file  . Highest education level: Not on file  Occupational History  . Occupation: physical therapy  Tobacco Use  . Smoking status: Former Smoker    Packs/day: 0.25  Years: 5.00    Pack years: 1.25    Quit date: 09/19/2005    Years since quitting: 15.2  . Smokeless tobacco: Never Used  . Tobacco comment: Quit 2007  Vaping Use  . Vaping Use: Never used  Substance and Sexual Activity  . Alcohol use: Yes    Comment: occassionally  . Drug use: No  . Sexual activity: Yes    Birth control/protection: I.U.D.  Other Topics Concern  . Not on file  Social History Narrative  . Not on file   Social Determinants of Health   Financial Resource Strain: Not on file  Food Insecurity: Not on file  Transportation Needs: Not on file  Physical Activity: Not on file  Stress: Not on file  Social Connections: Not on file  Intimate Partner Violence: Not on file    Outpatient Medications Prior to Visit  Medication Sig  Dispense Refill  . aspirin EC 81 MG tablet Take 1 tablet (81 mg total) by mouth daily. Swallow whole. 30 tablet 11  . cetirizine (ZYRTEC) 10 MG tablet Take 10 mg by mouth daily.    . Cholecalciferol (VITAMIN D3) 5000 units TABS 5,000 IU OTC vitamin D3 daily. 90 tablet 3  . EPINEPHrine 0.3 mg/0.3 mL IJ SOAJ injection Inject 0.3 mLs (0.3 mg total) into the muscle as needed for anaphylaxis. 2 each 1  . glimepiride (AMARYL) 2 MG tablet Take 2 mg by mouth daily.    Marland Kitchen glucose blood test strip Check 2 hour postprandial and fasting blood sugars daily; also if you feel poorly 100 each 12  . hydrochlorothiazide (MICROZIDE) 12.5 MG capsule Take 1 capsule (12.5 mg total) by mouth daily. 90 capsule 1  . hydrocortisone 2.5 % cream Apply 1 application topically daily as needed (skin irritation).     . Ibuprofen 200 MG CAPS Take 600 mg by mouth daily as needed (pain).     Marland Kitchen levonorgestrel (MIRENA) 20 MCG/24HR IUD 1 each by Intrauterine route continuous.     Marland Kitchen levothyroxine (SYNTHROID) 88 MCG tablet Take 88 mcg by mouth daily before breakfast.     . lisinopril (PRINIVIL,ZESTRIL) 20 MG tablet Take 20 mg by mouth daily.  6  . metFORMIN (GLUCOPHAGE-XR) 500 MG 24 hr tablet Take 1,000 mg by mouth in the morning and at bedtime.    . Multiple Vitamins-Minerals (HAIR/SKIN/NAILS/BIOTIN PO) Take 1 tablet by mouth daily.     . rosuvastatin (CRESTOR) 10 MG tablet Take 10 mg by mouth daily.    Marland Kitchen FLUoxetine (PROZAC) 40 MG capsule Take 1 capsule (40 mg total) by mouth daily. **NEEDS APT FOR REFILLS** 60 capsule 0   No facility-administered medications prior to visit.    Allergies  Allergen Reactions  . Januvia [Sitagliptin] Other (See Comments)    Pancreatitis  . Onglyza [Saxagliptin] Other (See Comments)    Pancreatitis to all DPP 4 inhibitor medications  . Cherry Swelling and Itching  . Lidocaine     Other reaction(s): Other (See Comments) Blisters  . Peach [Prunus Persica] Other (See Comments)    Facial tingling   . Saxagliptin-Metformin Er Other (See Comments)    ROS Review of Systems A fourteen system review of systems was performed and found to be positive as per HPI.   Objective:    Physical Exam General:  Pleasant and cooperative, in no acute distress  Neuro:  Alert and oriented,  extra-ocular muscles intact  HEENT:  Normocephalic, atraumatic, neck supple Skin:  no gross rash, warm, pink. Cardiac:  RRR,  S1 S2 wnl's, no murmur  Respiratory:  ECTA B/L w/o wheezing Not using accessory muscles, speaking in full sentences- unlabored. Vascular:  Ext warm, no cyanosis apprec.; cap RF less 2 sec. +trace edema of right LE Psych:  No HI/SI, judgement and insight good, Euthymic mood. Full Affect.   BP 105/70   Pulse 69   Temp (!) 97.4 F (36.3 C)   Ht 5\' 7"  (1.702 m)   Wt 187 lb 11.2 oz (85.1 kg)   SpO2 98%   BMI 29.40 kg/m  Wt Readings from Last 3 Encounters:  12/29/20 187 lb 11.2 oz (85.1 kg)  12/11/20 181 lb (82.1 kg)  08/27/20 174 lb (78.9 kg)     Health Maintenance Due  Topic Date Due  . Hepatitis C Screening  Never done  . COVID-19 Vaccine (1) Never done  . PAP SMEAR-Modifier  07/01/2017  . TETANUS/TDAP  09/19/2017  . FOOT EXAM  06/19/2018  . HEMOGLOBIN A1C  12/09/2020    There are no preventive care reminders to display for this patient.  Lab Results  Component Value Date   TSH 1.75 12/18/2018   Lab Results  Component Value Date   WBC 7.1 08/18/2020   HGB 12.5 08/18/2020   HCT 37.1 08/18/2020   MCV 89.8 08/18/2020   PLT 279 08/18/2020   Lab Results  Component Value Date   NA 137 09/08/2020   K 5.4 (H) 09/08/2020   CO2 26 09/08/2020   GLUCOSE 115 (H) 09/08/2020   BUN 13 09/08/2020   CREATININE 0.75 09/08/2020   BILITOT 0.6 08/18/2020   ALKPHOS 58 08/18/2020   AST 30 08/18/2020   ALT 43 08/18/2020   PROT 7.8 08/18/2020   ALBUMIN 5.0 08/18/2020   CALCIUM 10.3 (H) 09/08/2020   ANIONGAP 10 08/18/2020   Lab Results  Component Value Date   CHOL 186  10/11/2018   Lab Results  Component Value Date   HDL 46 10/11/2018   Lab Results  Component Value Date   LDLCALC 116 10/11/2018   Lab Results  Component Value Date   TRIG 122 10/11/2018   Lab Results  Component Value Date   CHOLHDL 3.9 06/16/2017   Lab Results  Component Value Date   HGBA1C 6.4 (A) 06/11/2020      Assessment & Plan:   Problem List Items Addressed This Visit      Cardiovascular and Mediastinum   Hypertension associated with diabetes ([place]) (Chronic)   Coronary artery disease involving native coronary artery of native heart without angina pectoris     Endocrine   Diabetes ([place]) (Chronic)   Thyroid disease- management per endocrinology Dr. Soyla Murphy (Chronic)   Hyperlipidemia associated with type 2 diabetes mellitus ([place])     Other   Adjustment disorder - Primary   Relevant Medications   FLUoxetine (PROZAC) 40 MG capsule     Diabetes: -Patient is followed by Endocrinology, Dr. Chalmers Cater.  -Continue current medication regimen. -Recommend ambulatory glucose monitoring and continue to monitor carbohydrates and glucose. -Will request most recent labs and consult.  Hypertension associated with diabetes: -Controlled. -Followed by endocrinology and cardiology. -Continue current medication regimen. -Recommend to continue to stay well hydrated and monitor sodium.  Hyperlipidemia associated with type 2 diabetes mellitus: -Followed by endocrinology and cardiology. -Recommend to continue rosuvastatin and follow a heart healthy diet. -Will request most recent labs from endocrinology.  Coronary artery disease involving native coronary artery of native heart without angina pectoris: -Followed by cardiology, Dr. Gasper Sells. Reviewed most recent consult. -Cardiac  CT 09/21/20: IMPRESSION:  1.Coronary artery calcium score 971 Agatston units. This places the  patient in the 99th percentile for age and gender, suggesting high  risk for future cardiac events.  2.  Extensive, age-advanced coronary plaque, but appears  nonobstructive. Will confirm with FFR. -On aspirin 81 mg, rosuvastatin 10 mg.  Adjustment disorder: -PHQ-9 score of 0, controlled. -Continue current medication regimen.  Provided refill. -Will continue to monitor.    Meds ordered this encounter  Medications  . FLUoxetine (PROZAC) 40 MG capsule    Sig: Take 1 capsule (40 mg total) by mouth daily.    Dispense:  90 capsule    Refill:  1    Follow-up: Return in about 6 months (around 06/30/2021) for Mood.   Note:  This note was prepared with assistance of Dragon voice recognition software. Occasional wrong-word or sound-a-like substitutions may have occurred due to the inherent limitations of voice recognition software.   Lorrene Reid, PA-C

## 2021-01-02 IMAGING — CR DG CHEST 2V
2 series · 2 of 2 positions shown · non-contrast
Comparison: Chest radiograph 09/13/2016.

CLINICAL DATA: Chest pain shortness of breath.

EXAM:
CHEST - 2 VIEW

[w chest pa]
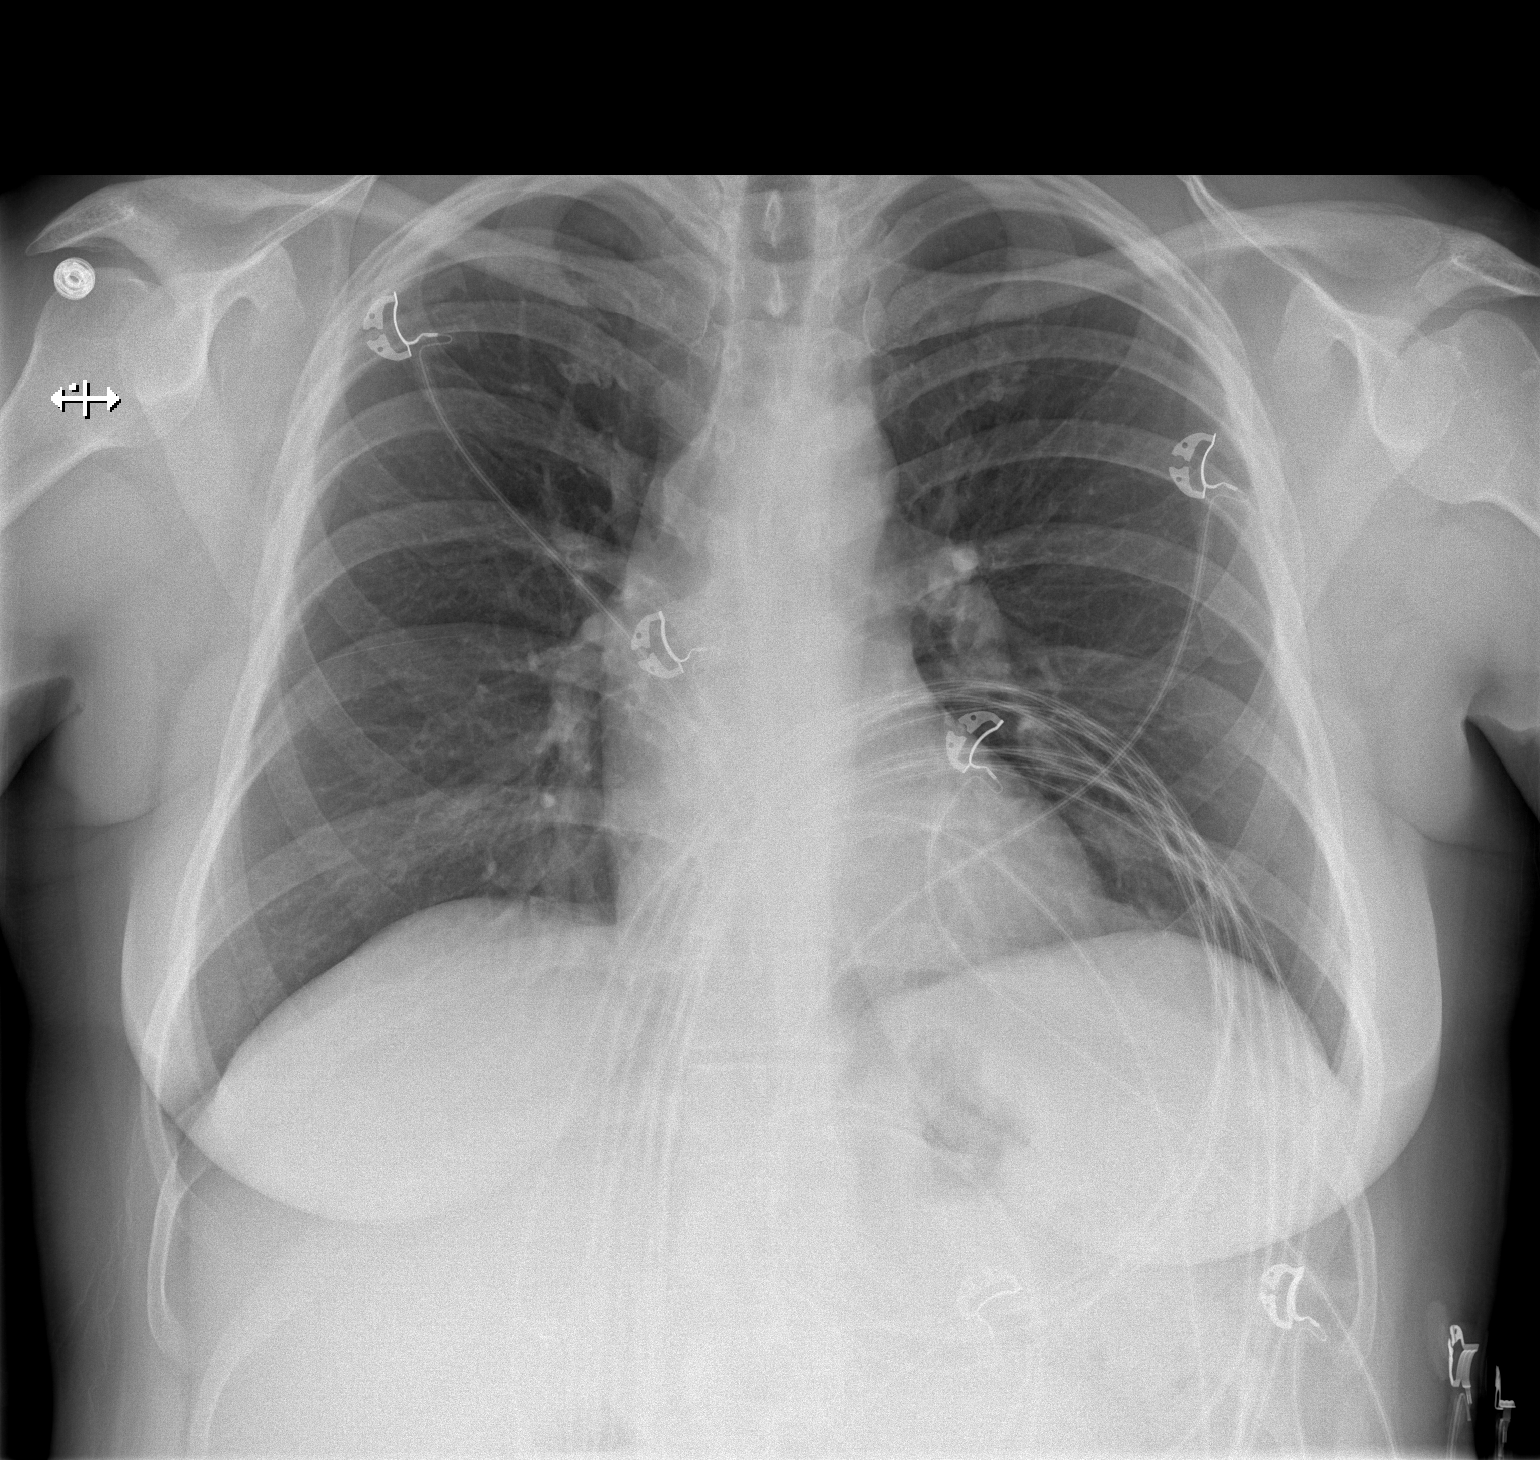

[w chest lat]
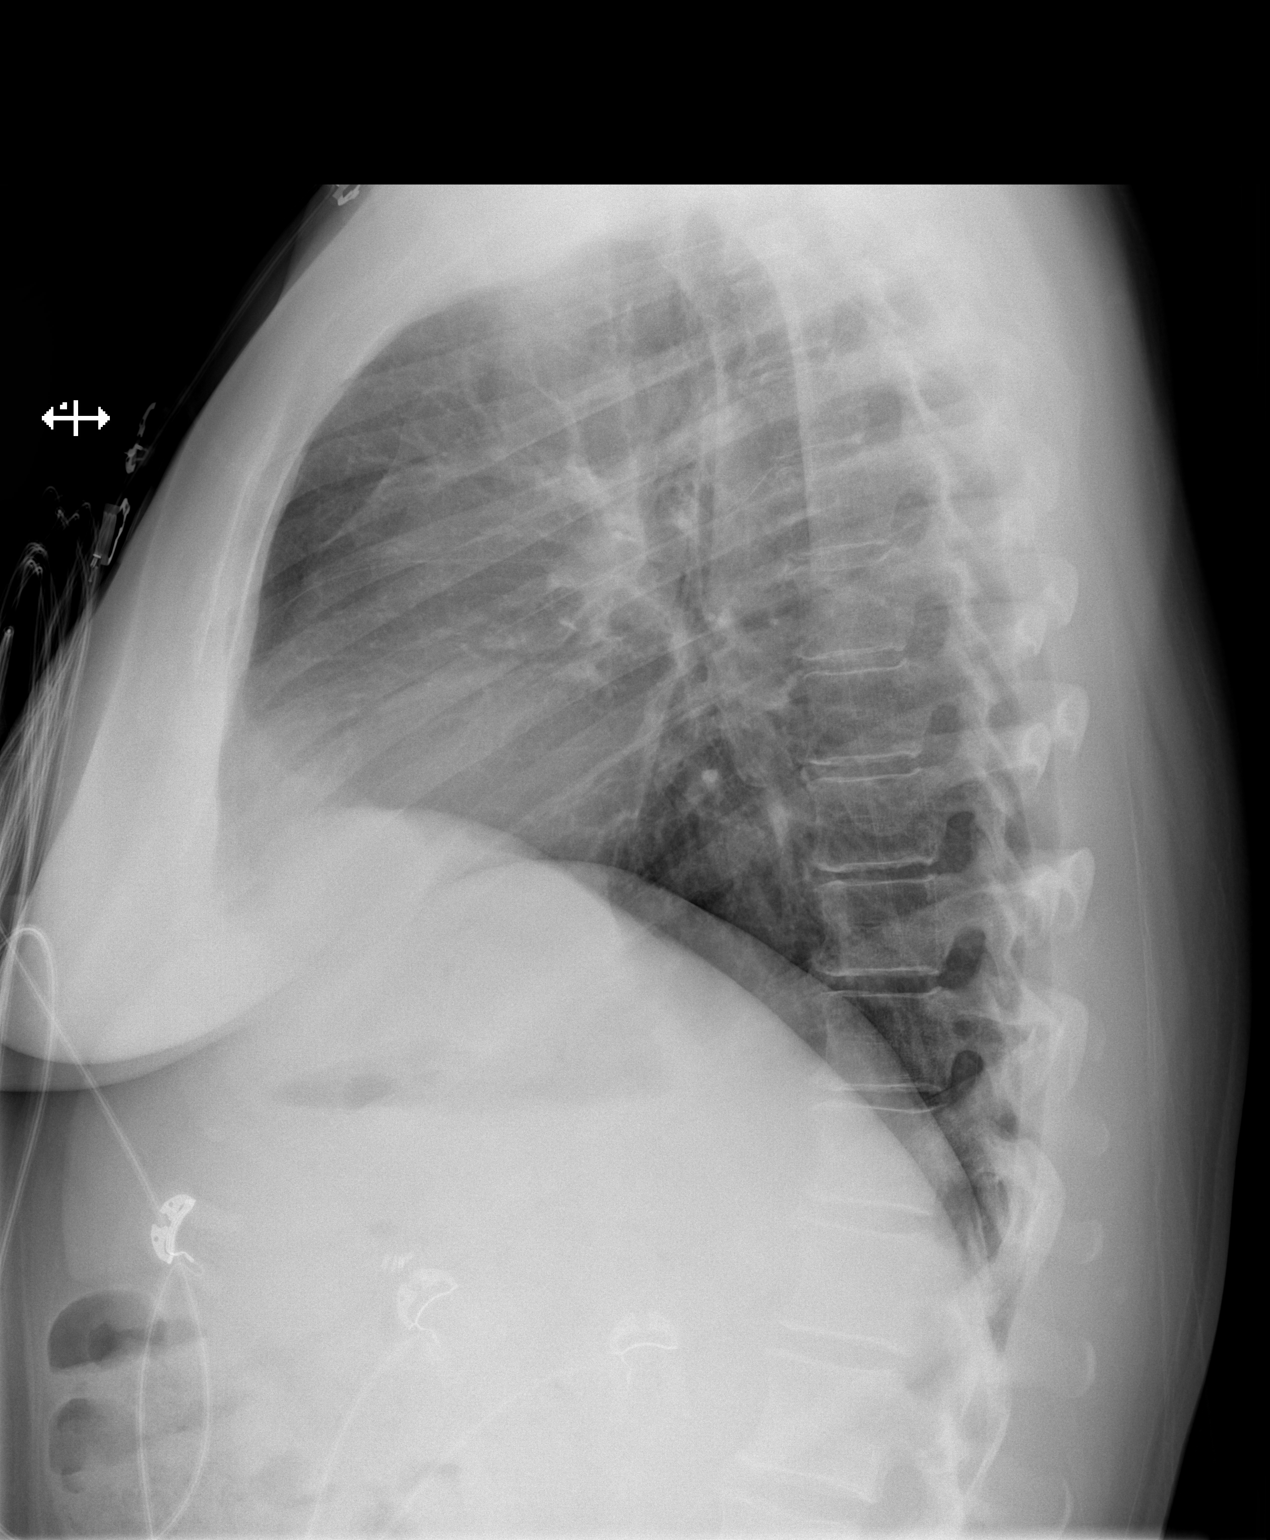

[2 of 2 positions shown; findings below may reference images not displayed]

FINDINGS: The heart size and mediastinal contours are within normal limits.
Both lungs are clear. The visualized skeletal structures are
unremarkable. Overlying EKG leads. Partially imaged cervical ACDF.
Cholecystectomy clips.
IMPRESSION: No acute cardiopulmonary disease.

## 2021-01-18 DIAGNOSIS — L570 Actinic keratosis: Secondary | ICD-10-CM | POA: Diagnosis not present

## 2021-01-18 DIAGNOSIS — L720 Epidermal cyst: Secondary | ICD-10-CM | POA: Diagnosis not present

## 2021-01-18 DIAGNOSIS — D485 Neoplasm of uncertain behavior of skin: Secondary | ICD-10-CM | POA: Diagnosis not present

## 2021-01-18 DIAGNOSIS — D2222 Melanocytic nevi of left ear and external auricular canal: Secondary | ICD-10-CM | POA: Diagnosis not present

## 2021-01-18 DIAGNOSIS — D224 Melanocytic nevi of scalp and neck: Secondary | ICD-10-CM | POA: Diagnosis not present

## 2021-01-18 DIAGNOSIS — Z85828 Personal history of other malignant neoplasm of skin: Secondary | ICD-10-CM | POA: Diagnosis not present

## 2021-01-18 DIAGNOSIS — D225 Melanocytic nevi of trunk: Secondary | ICD-10-CM | POA: Diagnosis not present

## 2021-01-25 ENCOUNTER — Other Ambulatory Visit: Payer: Self-pay

## 2021-01-25 ENCOUNTER — Encounter: Payer: Self-pay | Admitting: Physician Assistant

## 2021-01-25 ENCOUNTER — Ambulatory Visit: Payer: BC Managed Care – PPO

## 2021-01-25 DIAGNOSIS — R8290 Unspecified abnormal findings in urine: Secondary | ICD-10-CM | POA: Diagnosis not present

## 2021-01-25 DIAGNOSIS — R309 Painful micturition, unspecified: Secondary | ICD-10-CM

## 2021-01-25 DIAGNOSIS — N3090 Cystitis, unspecified without hematuria: Secondary | ICD-10-CM

## 2021-01-25 LAB — POCT URINALYSIS DIPSTICK
Bilirubin, UA: NEGATIVE
Glucose, UA: POSITIVE — AB
Ketones, UA: NEGATIVE
Nitrite, UA: NEGATIVE
Protein, UA: NEGATIVE
Spec Grav, UA: 1.01 (ref 1.010–1.025)
Urobilinogen, UA: 0.2 E.U./dL
pH, UA: 5.5 (ref 5.0–8.0)

## 2021-01-25 MED ORDER — NITROFURANTOIN MONOHYD MACRO 100 MG PO CAPS: 100.0000 mg | ORAL_CAPSULE | Freq: Two times a day (BID) | ORAL | 0 refills | Status: DC

## 2021-01-27 LAB — URINE CULTURE

## 2021-02-03 ENCOUNTER — Telehealth: Payer: Self-pay

## 2021-02-03 DIAGNOSIS — Z006 Encounter for examination for normal comparison and control in clinical research program: Secondary | ICD-10-CM

## 2021-02-03 NOTE — Telephone Encounter (Signed)
I have attempted without success to contact this patient by phone for her Identify 90 day follow up phone call. I left a message for patient to return my phone call with my name and callback number. An e-mail was also sent to patient.  

## 2021-02-05 IMAGING — CT CT HEART MORP W/ CTA COR W/ SCORE W/ CA W/CM &/OR W/O CM
4 of 7 series · 8 of 20 positions shown, 9 images · IV contrast (APPLIED)
Comparison: None.
COMPARISON: None.

Addendum:
EXAM:
OVER-READ INTERPRETATION  CT CHEST

The following report is an over-read performed by radiologist Dr.
Alia Tiger [REDACTED] on 09/21/2020. This
over-read does not include interpretation of cardiac or coronary
anatomy or pathology. The coronary calcium score/coronary CTA
interpretation by the cardiologist is attached.
CLINICAL DATA: Chest pain
Cardiac CTA
MEDICATIONS:
Sub lingual nitro. 4mg x 2
TECHNIQUE: The patient was scanned on a Siemens [REDACTED]ice scanner. Gantry
rotation speed was 250 msecs. Collimation was 0.6 mm. A 100 kV
prospective scan was triggered in the ascending thoracic aorta at
35-75% of the R-R interval. Average HR during the scan was 70 bpm.
The 3D data set was interpreted on a dedicated work station using
MPR, MIP and VRT modes. A total of 80cc of contrast was used.

[Series 6: best diast 73 % · axial · 0.39mm/px · z∈[-310,-273]mm · 2 of 276 slices shown]
[im 92/276  vessel]
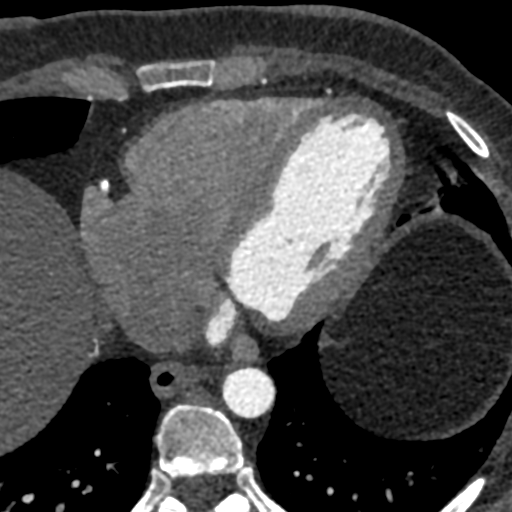
[im 184/276  vessel]
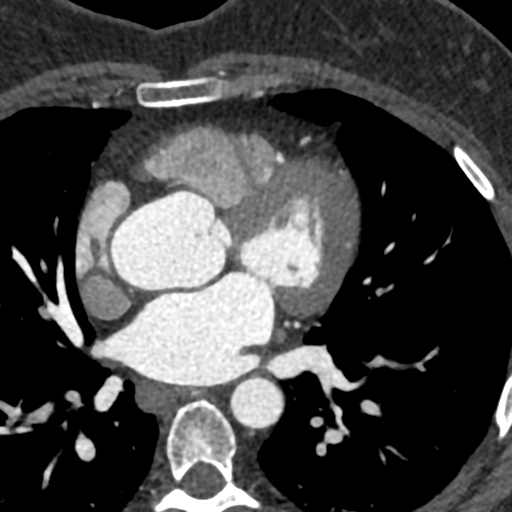

[Series 7: best syst · axial · 0.39mm/px · z∈[-310,-273]mm · 2 of 276 slices shown, 3 images]
[im 92/276  vessel]
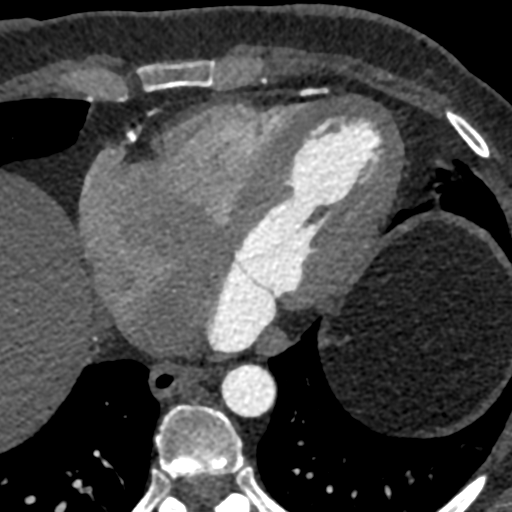
[im 92/276  lung]
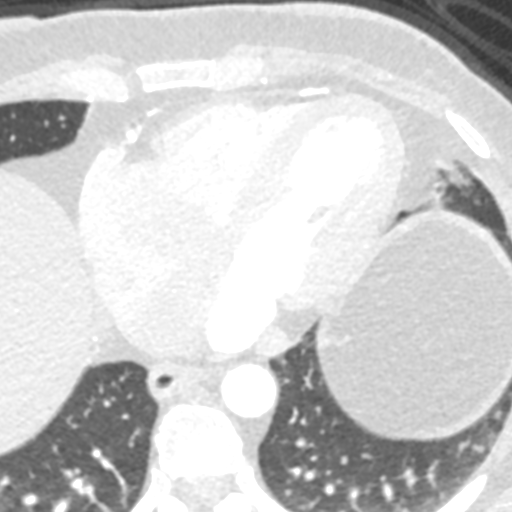
[im 184/276  vessel]
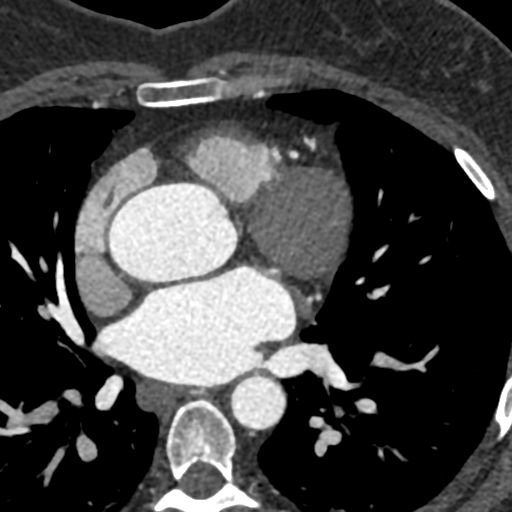

[Series 8: ts diast sharp 73 % · axial · 0.39mm/px · z∈[-310,-273]mm · 2 of 276 slices shown]
[im 92/276  lung]
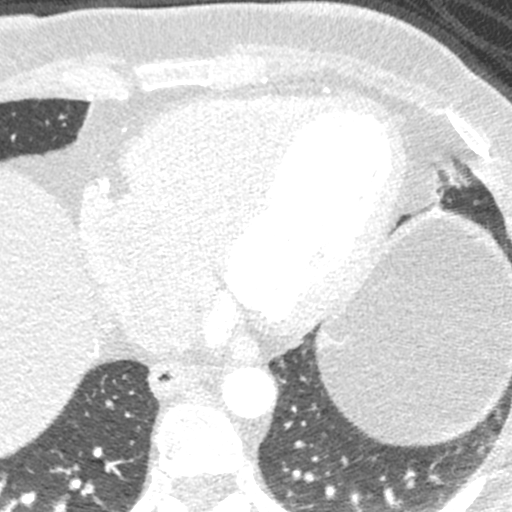
[im 184/276  lung]
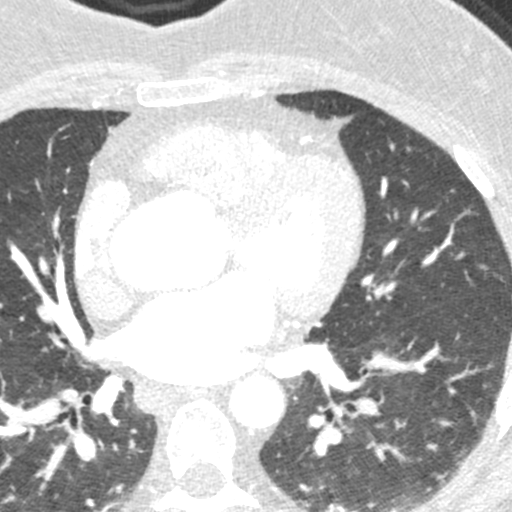

[Series 9: ts syst sharp · axial · 0.39mm/px · z∈[-310,-273]mm · 2 of 276 slices shown]
[im 92/276  lung]
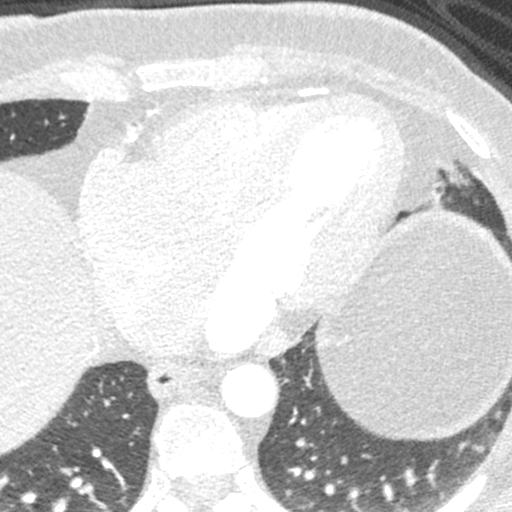
[im 184/276  lung]
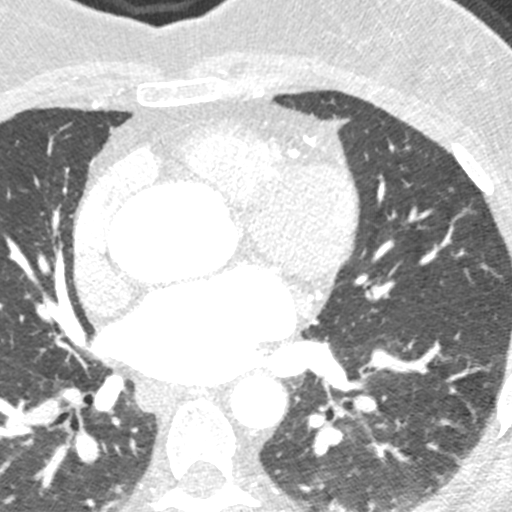

[8 of 20 positions shown; findings below may reference images not displayed]

FINDINGS: Within the visualized portions of the thorax there are no suspicious
appearing pulmonary nodules or masses, there is no acute
consolidative airspace disease, no pleural effusions, no
pneumothorax and no lymphadenopathy. Visualized portions of the
upper abdomen are unremarkable. There are no aggressive appearing
lytic or blastic lesions noted in the visualized portions of the
skeleton.
IMPRESSION: 1. No significant incidental noncardiac findings are noted.
FINDINGS: Non-cardiac: See separate report from [REDACTED].

Pulmonary veins drain normally to the left atrium. No evidence for
LA appendage thrombus.

Calcium Score: 971 Agatston units.

Coronary Arteries: Right dominant with no anomalies

LM: No plaque or stenosis.

LAD system: Mixed plaque proximal and mid LAD, mild (<50%) stenosis.

Circumflex system: Mixed plaque proximal LCx, mild (<50%) stenosis.

RCA system: Mixed plaque noted in the proximal, mid, and distal RCA.
Stenosis appears mild (<50%).
IMPRESSION: 1.Coronary artery calcium score 971 Agatston units. This places the
patient in the 99th percentile for age and gender, suggesting high
risk for future cardiac events.

2. Extensive, age-advanced coronary plaque, but appears
nonobstructive. Will confirm with FFR.

Sinem Cho

*** End of Addendum ***
EXAM:
OVER-READ INTERPRETATION  CT CHEST

The following report is an over-read performed by radiologist Dr.
Alia Tiger [REDACTED] on 09/21/2020. This
over-read does not include interpretation of cardiac or coronary
anatomy or pathology. The coronary calcium score/coronary CTA
interpretation by the cardiologist is attached.
FINDINGS: Within the visualized portions of the thorax there are no suspicious
appearing pulmonary nodules or masses, there is no acute
consolidative airspace disease, no pleural effusions, no
pneumothorax and no lymphadenopathy. Visualized portions of the
upper abdomen are unremarkable. There are no aggressive appearing
lytic or blastic lesions noted in the visualized portions of the
skeleton.
IMPRESSION: 1. No significant incidental noncardiac findings are noted.

## 2021-02-18 DIAGNOSIS — E119 Type 2 diabetes mellitus without complications: Secondary | ICD-10-CM | POA: Diagnosis not present

## 2021-02-18 DIAGNOSIS — E039 Hypothyroidism, unspecified: Secondary | ICD-10-CM | POA: Diagnosis not present

## 2021-02-18 DIAGNOSIS — E7801 Familial hypercholesterolemia: Secondary | ICD-10-CM | POA: Diagnosis not present

## 2021-02-22 ENCOUNTER — Other Ambulatory Visit: Payer: Self-pay | Admitting: Internal Medicine

## 2021-02-22 DIAGNOSIS — Z8249 Family history of ischemic heart disease and other diseases of the circulatory system: Secondary | ICD-10-CM

## 2021-02-22 DIAGNOSIS — E785 Hyperlipidemia, unspecified: Secondary | ICD-10-CM

## 2021-02-22 DIAGNOSIS — R0600 Dyspnea, unspecified: Secondary | ICD-10-CM

## 2021-02-22 DIAGNOSIS — R072 Precordial pain: Secondary | ICD-10-CM

## 2021-02-22 DIAGNOSIS — R079 Chest pain, unspecified: Secondary | ICD-10-CM

## 2021-02-22 DIAGNOSIS — E1169 Type 2 diabetes mellitus with other specified complication: Secondary | ICD-10-CM

## 2021-02-22 DIAGNOSIS — R0609 Other forms of dyspnea: Secondary | ICD-10-CM

## 2021-02-22 DIAGNOSIS — I152 Hypertension secondary to endocrine disorders: Secondary | ICD-10-CM

## 2021-02-24 DIAGNOSIS — R945 Abnormal results of liver function studies: Secondary | ICD-10-CM | POA: Diagnosis not present

## 2021-02-24 DIAGNOSIS — E119 Type 2 diabetes mellitus without complications: Secondary | ICD-10-CM | POA: Diagnosis not present

## 2021-02-24 DIAGNOSIS — I10 Essential (primary) hypertension: Secondary | ICD-10-CM | POA: Diagnosis not present

## 2021-02-24 DIAGNOSIS — E039 Hypothyroidism, unspecified: Secondary | ICD-10-CM | POA: Diagnosis not present

## 2021-03-04 NOTE — Patient Instructions (Addendum)
Error

## 2021-04-01 NOTE — Progress Notes (Deleted)
Error

## 2021-04-02 ENCOUNTER — Ambulatory Visit (HOSPITAL_COMMUNITY): Payer: BC Managed Care – PPO | Attending: Cardiovascular Disease

## 2021-04-02 ENCOUNTER — Other Ambulatory Visit: Payer: Self-pay

## 2021-04-02 DIAGNOSIS — I77810 Thoracic aortic ectasia: Secondary | ICD-10-CM

## 2021-04-02 LAB — ECHOCARDIOGRAM COMPLETE
Area-P 1/2: 3.42 cm2
S' Lateral: 2.5 cm

## 2021-04-04 NOTE — Progress Notes (Signed)
Cardiology Office Note:    Date:  04/05/2021   ID:  Jessica Chang, DOB 06-Feb-1971, MRN [id number]  PCP:  Lorrene Reid, PA-C  [facility] Cardiologist: Rudean Haskell MD [place] Electrophysiologist:  None   CC: TAA f/u  I connected with  Jessica Chang on 04/05/21 by a video enabled telemedicine application and verified that I am speaking with the correct person using two identifiers.   I discussed the limitations of evaluation and management by telemedicine. The patient expressed understanding and agreed to proceed. Patient Location: Work Provider Location: Home Video Visit- 7 minutes  History of Present Illness:    Jessica Chang is a 50 y.o. female with a hx of Diabetes with HTN, Chronic HTN of pregnancy (circa 2007)  HLD, Prior COVID infection in August who presents for evaluation.  In interim of this visit, patient had mild non-obstructive CAD by Cardiac CT.  Since last visit had echo with mild dilation of the thoracic aorta. Video Visit 04/05/21.  Patient notes that she is doing better.  Since last visit notes improvement in her discomfort and notes that she is looking to start working out again. There are no interval hospital/ED visit.    No chest pain or pressure.  No SOB/DOE and no PND/Orthopnea.  No weight gain or leg swelling.  Does notes occasional palpitations.  Saw her PCP recently with improved cholesterol.  Past Medical History:  Diagnosis Date   Acute pancreatitis    Allergy    seasonal    Arthritis    2021 right hip   Cataract    2020   COVID    Diabetes ([place])    Gallstones    Hyperlipidemia    Hypertension    Pneumonia    Squamous cell carcinoma    skin-sx on 2021   Thyroid disease    2016    Past Surgical History:  Procedure Laterality Date   CATARACT EXTRACTION  2020   CERVICAL SPINE SURGERY     CESAREAN SECTION  2008   CHOLECYSTECTOMY  2005   HIP ARTHROSCOPY Right 2011, 2016   KNEE ARTHROSCOPY Right 1988   SKIN CANCER  EXCISION  2021   TONSILECTOMY, ADENOIDECTOMY, BILATERAL MYRINGOTOMY AND TUBES  1989   TONSILLECTOMY  1989   WISDOM TOOTH EXTRACTION  1990    Current Medications: No outpatient medications have been marked as taking for the 04/05/21 encounter (Video Visit) with Werner Lean, MD.     Allergies:   Januvia [sitagliptin], Onglyza [saxagliptin], Cherry, Lidocaine, Peach [prunus persica], and Saxagliptin-metformin er   Social History   Socioeconomic History   Marital status: Married    Spouse name: Not on file   Number of children: 1   Years of education: Not on file   Highest education level: Not on file  Occupational History   Occupation: physical therapy  Tobacco Use   Smoking status: Former    Packs/day: 0.25    Years: 5.00    Pack years: 1.25    Types: Cigarettes    Quit date: 09/19/2005    Years since quitting: 15.5   Smokeless tobacco: Never   Tobacco comments:    Quit 2007  Vaping Use   Vaping Use: Never used  Substance and Sexual Activity   Alcohol use: Yes    Comment: occassionally   Drug use: No   Sexual activity: Yes    Birth control/protection: I.U.D.  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  Family History: The patient's family history includes AAA (abdominal aortic aneurysm) in her maternal grandfather; AAA (abdominal aortic aneurysm) (age of onset: 47) in her father; Alcohol abuse in her sister; Allergic rhinitis in her sister; Aneurysm in her father; Asthma in her sister; Basal cell carcinoma in her mother; Breast cancer in her maternal grandmother; Colon polyps (age of onset: 27) in her father; Diabetes in her father and paternal grandmother;  Heart attack in her paternal aunt; Heart disease in her mother; Hyperlipidemia in her father; Hypertension in her father; Melanoma in her father, paternal aunt, and paternal grandfather; Mental illness in her sister; Prostate cancer (age of onset: 38) in her father; Stroke in her paternal grandfather; Urticaria in her sister. There is no history of Colon cancer, Esophageal cancer, Rectal cancer, or Stomach cancer. Father had Bental Procedure: for thoracic aortic aneursym. Mother had HFrecoveredEF  ROS:   Please see the history of present illness.    All other systems reviewed and are negative.  EKGs/Labs/Other Studies Reviewed:    The following studies were reviewed today:  EKG:   08/19/20:  SR 91 WNL 08/27/20 SR 80 WNL  Transthoracic Echocardiogram: Date: 12/21 Results: Aorta ULN  1. Left ventricular ejection fraction, by estimation, is 60 to 65%. Left  ventricular ejection fraction by 3D volume is 60 %. The left ventricle has  normal function. The left ventricle has no regional wall motion  abnormalities. Left ventricular diastolic   parameters were normal. The average left ventricular global longitudinal  strain is -20.2 %. The global longitudinal strain is normal.   2. Right ventricular systolic function is normal. The right ventricular  size is normal. Tricuspid regurgitation signal is inadequate for assessing  PA pressure.   3. The mitral valve is grossly normal. Trivial mitral valve  regurgitation. No evidence of mitral stenosis.   4. The aortic valve is tricuspid. Aortic valve regurgitation is not  visualized. No aortic stenosis is present.   5. There is mild dilatation of the ascending aorta, measuring 37 mm.   6. The inferior vena cava is normal in size with greater than 50%  respiratory variability, suggesting right atrial pressure of 3 mmHg.   Date 7/22 Results:   1. Left ventricular ejection fraction, by estimation, is 60 to 65%. The  left ventricle has normal  function. The left ventricle has no regional  wall motion abnormalities. Left ventricular diastolic parameters were  normal.   2. Right ventricular systolic function is normal. The right ventricular  size is normal.   3. The mitral valve is normal in structure. No evidence of mitral valve  regurgitation.   4. The aortic valve is tricuspid. Aortic valve regurgitation is not  visualized.   5. Aortic dilatation noted. There is mild dilatation of the ascending  aorta, measuring 39 mm.   Cardiac CT: Date:09/21/20 Results:  LAD system: Mixed plaque proximal and mid LAD, mild (<50%) stenosis.   Circumflex system: Mixed plaque proximal LCx, mild (<50%) stenosis.   RCA system: Mixed plaque noted in the proximal, mid, and distal RCA. Stenosis appears mild (<50%).  IMPRESSION: No evidence for hemodynamically significant coronary disease.   [place] [employer]  Recent Labs: 08/18/2020: ALT 43; Hemoglobin 12.5; Platelets 279 09/08/2020: BUN 13; Creatinine, Ser 0.75; Magnesium 2.0; Potassium 5.4; Sodium 137  Recent Lipid Panel    Component Value Date/Time   CHOL 186 10/11/2018 0000   CHOL 177 06/16/2017 0850   TRIG 122 10/11/2018 0000   HDL 46 10/11/2018 0000   HDL 45 06/16/2017 0850   CHOLHDL 3.9 06/16/2017 0850   CHOLHDL 4.0 05/20/2016 0815   VLDL 28 05/20/2016 0815   LDLCALC 116 10/11/2018 0000   LDLCALC 108 (H) 06/16/2017 0850    Risk Assessment/Calculations:     N/A  Physical Exam:    VS:  There were no vitals taken for this visit.    Wt Readings from Last 3 Encounters:  12/29/20 85.1 kg  12/11/20 82.1 kg  08/27/20 78.9 kg    Video visit  GEN:  Well nourished, well developed in no acute distress HEENT: Normal NECK: No JVD; No carotid bruits LYMPHATICS: No lymphadenopathy PSYCHIATRIC:  Normal affect   ASSESSMENT:    1. Thoracic aortic aneurysm without rupture ([place])   2. Coronary artery disease involving native coronary artery of native heart without angina  pectoris     PLAN:    In order of problems listed above:  Coronary Artery Disease Nonobstructive HTN with DM Thoracic aortic aneurysm - asymptomatic  - anatomy: mild scattered calcifications all vessels - start ASA 81 mg - continue statin, goal LDL < 70; patient will send [place] her recent lipids for review - TAA is stable is size, mild based on age and BSA, will get echo in 11-12 months for monitoring - no exercise limitations presently outside of extreme power lifting   One year follow up unless new symptoms or abnormal test results warranting change in plan  Would be reasonable for  APP Follow up   Medication Adjustments/Labs and Tests Ordered: Current medicines are reviewed at length with the patient today.  Concerns regarding medicines are outlined above.  Orders Placed This Encounter  Procedures   ECHOCARDIOGRAM COMPLETE    No orders of the defined types were placed in this encounter.   Patient Instructions  Medication Instructions:  Your physician recommends that you continue on your current medications as directed. Please refer to the Current Medication list given to you today.  *If you need a refill on your cardiac medications before your next appointment, please call your pharmacy*   Lab Work: Please send in lab work results from previous blood draw.  Your physician would like to review your cholesterol levels.   If you have labs (blood work) drawn today and your tests are completely normal, you will receive your results only by: [place] (if you have MyChart) OR A paper copy in the mail If you have any lab test that is abnormal or we need to change your treatment, we will call you to review the results.   Testing/Procedures: Your physician has requested that you have an echocardiogram in 11 months (due June 2023). Echocardiography is a painless test that uses sound waves to create images of your heart. It provides your doctor with information about the  size and shape of your heart and how well your heart's chambers and valves are working. This procedure takes approximately one hour. There are no restrictions for this procedure.    Follow-Up: At [facility], you and your health needs are our priority.  As part of our continuing mission to provide you with exceptional heart care, we have created designated Provider Care  Teams.  These Care Teams include your primary Cardiologist (physician) and Advanced Practice Providers (APPs -  Physician Assistants and Nurse Practitioners) who all work together to provide you with the care you need, when you need it.  Your next appointment:   12 month(s)  The format for your next appointment:   In Person  Provider:   You may see Rudean Haskell, MD or one of the following Advanced Practice Providers on your designated Care Team:   Melina Copa, PA-C Ermalinda Barrios, PA-C        Signed, Werner Lean, MD  04/05/2021 9:52 AM    [place]

## 2021-04-05 ENCOUNTER — Telehealth (INDEPENDENT_AMBULATORY_CARE_PROVIDER_SITE_OTHER): Payer: BC Managed Care – PPO | Admitting: Internal Medicine

## 2021-04-05 ENCOUNTER — Telehealth: Payer: Self-pay

## 2021-04-05 ENCOUNTER — Other Ambulatory Visit: Payer: Self-pay

## 2021-04-05 DIAGNOSIS — I712 Thoracic aortic aneurysm, without rupture, unspecified: Secondary | ICD-10-CM | POA: Insufficient documentation

## 2021-04-05 DIAGNOSIS — I2510 Atherosclerotic heart disease of native coronary artery without angina pectoris: Secondary | ICD-10-CM

## 2021-04-05 NOTE — Patient Instructions (Signed)
Medication Instructions:  Your physician recommends that you continue on your current medications as directed. Please refer to the Current Medication list given to you today.  *If you need a refill on your cardiac medications before your next appointment, please call your pharmacy*   Lab Work: Please send in lab work results from previous blood draw.  Your physician would like to review your cholesterol levels.   If you have labs (blood work) drawn today and your tests are completely normal, you will receive your results only by: [place] (if you have MyChart) OR A paper copy in the mail If you have any lab test that is abnormal or we need to change your treatment, we will call you to review the results.   Testing/Procedures: Your physician has requested that you have an echocardiogram in 11 months (due June 2023). Echocardiography is a painless test that uses sound waves to create images of your heart. It provides your doctor with information about the size and shape of your heart and how well your heart's chambers and valves are working. This procedure takes approximately one hour. There are no restrictions for this procedure.    Follow-Up: At [facility], you and your health needs are our priority.  As part of our continuing mission to provide you with exceptional heart care, we have created designated Provider Care Teams.  These Care Teams include your primary Cardiologist (physician) and Advanced Practice Providers (APPs -  Physician Assistants and Nurse Practitioners) who all work together to provide you with the care you need, when you need it.  Your next appointment:   12 month(s)  The format for your next appointment:   In Person  Provider:   You may see Rudean Haskell, MD or one of the following Advanced Practice Providers on your designated Care Team:   Melina Copa, PA-C Ermalinda Barrios, PA-C

## 2021-04-05 NOTE — Telephone Encounter (Signed)
  Patient Consent for Virtual Visit        Jessica Chang has provided verbal consent on 04/05/2021 for a virtual visit (video or telephone).   CONSENT FOR VIRTUAL VISIT FOR:  Jessica Chang  By participating in this virtual visit I agree to the following:  I hereby voluntarily request, consent and authorize [facility] and its employed or contracted physicians, physician assistants, nurse practitioners or other licensed health care professionals (the Practitioner), to provide me with telemedicine health care services (the "Services") as deemed necessary by the treating Practitioner. I acknowledge and consent to receive the Services by the Practitioner via telemedicine. I understand that the telemedicine visit will involve communicating with the Practitioner through live audiovisual communication technology and the disclosure of certain medical information by electronic transmission. I acknowledge that I have been given the opportunity to request an in-person assessment or other available alternative prior to the telemedicine visit and am voluntarily participating in the telemedicine visit.  I understand that I have the right to withhold or withdraw my consent to the use of telemedicine in the course of my care at any time, without affecting my right to future care or treatment, and that the Practitioner or I may terminate the telemedicine visit at any time. I understand that I have the right to inspect all information obtained and/or recorded in the course of the telemedicine visit and may receive copies of available information for a reasonable fee.  I understand that some of the potential risks of receiving the Services via telemedicine include:  Delay or interruption in medical evaluation due to technological equipment failure or disruption; Information transmitted may not be sufficient (e.g. poor resolution of images) to allow for appropriate medical decision making by the Practitioner; and/or   In rare instances, security protocols could fail, causing a breach of personal health information.  Furthermore, I acknowledge that it is my responsibility to provide information about my medical history, conditions and care that is complete and accurate to the best of my ability. I acknowledge that Practitioner's advice, recommendations, and/or decision may be based on factors not within their control, such as incomplete or inaccurate data provided by me or distortions of diagnostic images or specimens that may result from electronic transmissions. I understand that the practice of medicine is not an exact science and that Practitioner makes no warranties or guarantees regarding treatment outcomes. I acknowledge that a copy of this consent can be made available to me via my patient portal ([place]), or I can request a printed copy by calling the office of [facility].    I understand that my insurance will be billed for this visit.   I have read or had this consent read to me. I understand the contents of this consent, which adequately explains the benefits and risks of the Services being provided via telemedicine.  I have been provided ample opportunity to ask questions regarding this consent and the Services and have had my questions answered to my satisfaction. I give my informed consent for the services to be provided through the use of telemedicine in my medical care

## 2021-04-12 ENCOUNTER — Encounter (HOSPITAL_COMMUNITY): Payer: Self-pay | Admitting: Internal Medicine

## 2021-04-26 ENCOUNTER — Telehealth (HOSPITAL_COMMUNITY): Payer: Self-pay | Admitting: Internal Medicine

## 2021-04-26 NOTE — Telephone Encounter (Signed)
Just an FYI. We have made several attempts to contact this patient including sending a letter to schedule or reschedule their echocardiogram. We will be removing the patient from the echo [place].   04/12/21 MAILED LETTER [facility]  04/07/21'@401'$  LMOM evd 04/06/21'@1209'$  LMOM EVD 04/05/21'@1342'$  LMOM to call and schedule echo EVD     Thank you

## 2021-06-01 DIAGNOSIS — E039 Hypothyroidism, unspecified: Secondary | ICD-10-CM | POA: Diagnosis not present

## 2021-06-01 DIAGNOSIS — E119 Type 2 diabetes mellitus without complications: Secondary | ICD-10-CM | POA: Diagnosis not present

## 2021-06-01 DIAGNOSIS — I10 Essential (primary) hypertension: Secondary | ICD-10-CM | POA: Diagnosis not present

## 2021-06-01 DIAGNOSIS — E7801 Familial hypercholesterolemia: Secondary | ICD-10-CM | POA: Diagnosis not present

## 2021-06-02 DIAGNOSIS — Z1231 Encounter for screening mammogram for malignant neoplasm of breast: Secondary | ICD-10-CM | POA: Diagnosis not present

## 2021-06-02 DIAGNOSIS — R21 Rash and other nonspecific skin eruption: Secondary | ICD-10-CM | POA: Diagnosis not present

## 2021-06-02 DIAGNOSIS — N9089 Other specified noninflammatory disorders of vulva and perineum: Secondary | ICD-10-CM | POA: Diagnosis not present

## 2021-06-02 DIAGNOSIS — Z6827 Body mass index (BMI) 27.0-27.9, adult: Secondary | ICD-10-CM | POA: Diagnosis not present

## 2021-06-02 DIAGNOSIS — Z01419 Encounter for gynecological examination (general) (routine) without abnormal findings: Secondary | ICD-10-CM | POA: Diagnosis not present

## 2021-06-02 LAB — HM MAMMOGRAPHY

## 2021-06-02 LAB — HM PAP SMEAR: HM Pap smear: NORMAL

## 2021-06-07 DIAGNOSIS — I10 Essential (primary) hypertension: Secondary | ICD-10-CM | POA: Diagnosis not present

## 2021-06-07 DIAGNOSIS — E039 Hypothyroidism, unspecified: Secondary | ICD-10-CM | POA: Diagnosis not present

## 2021-06-07 DIAGNOSIS — E119 Type 2 diabetes mellitus without complications: Secondary | ICD-10-CM | POA: Diagnosis not present

## 2021-06-07 DIAGNOSIS — R945 Abnormal results of liver function studies: Secondary | ICD-10-CM | POA: Diagnosis not present

## 2021-06-30 ENCOUNTER — Ambulatory Visit: Payer: BC Managed Care – PPO | Admitting: Physician Assistant

## 2021-06-30 ENCOUNTER — Encounter: Payer: Self-pay | Admitting: Physician Assistant

## 2021-06-30 ENCOUNTER — Other Ambulatory Visit: Payer: Self-pay

## 2021-06-30 VITALS — BP 105/69 | HR 68 | Temp 97.6°F | Ht 67.0 in | Wt 180.0 lb

## 2021-06-30 DIAGNOSIS — I152 Hypertension secondary to endocrine disorders: Secondary | ICD-10-CM

## 2021-06-30 DIAGNOSIS — E079 Disorder of thyroid, unspecified: Secondary | ICD-10-CM | POA: Diagnosis not present

## 2021-06-30 DIAGNOSIS — E1169 Type 2 diabetes mellitus with other specified complication: Secondary | ICD-10-CM | POA: Diagnosis not present

## 2021-06-30 DIAGNOSIS — E229 Hyperfunction of pituitary gland, unspecified: Secondary | ICD-10-CM

## 2021-06-30 DIAGNOSIS — F4320 Adjustment disorder, unspecified: Secondary | ICD-10-CM

## 2021-06-30 DIAGNOSIS — E1159 Type 2 diabetes mellitus with other circulatory complications: Secondary | ICD-10-CM

## 2021-06-30 DIAGNOSIS — E785 Hyperlipidemia, unspecified: Secondary | ICD-10-CM

## 2021-06-30 DIAGNOSIS — E119 Type 2 diabetes mellitus without complications: Secondary | ICD-10-CM

## 2021-06-30 NOTE — Assessment & Plan Note (Signed)
>>  ASSESSMENT AND PLAN FOR DIABETES (HCC) WRITTEN ON 06/30/2021  4:56 PM BY ABONZA, MARITZA, PA-C  -Followed by Endocrinology. -A1c 06/01/2021 was 7.7, continue to follow up with Endocrinology. -Encourage to resume physical activity level. -Will continue to monitor.

## 2021-06-30 NOTE — Assessment & Plan Note (Signed)
-  Followed by Endocrinology. -A1c 06/01/2021 was 7.7, continue to follow up with Endocrinology. -Encourage to resume physical activity level. -Will continue to monitor.

## 2021-06-30 NOTE — Assessment & Plan Note (Signed)
-  Followed by Endocrinology.

## 2021-06-30 NOTE — Assessment & Plan Note (Signed)
-  Patient had lipid panel obtained with endocrinology 06/01/2021, will attempt to request lab copy. -Continue rosuvastatin 10 mg. -Recommend to resume physical activity regimen and follow a low fat diet.

## 2021-06-30 NOTE — Progress Notes (Signed)
Established Patient Office Visit  Subjective:  Patient ID: Jessica Chang, female    DOB: 1971/01/27  Age: 50 y.o. MRN: [id number]  CC:  Chief Complaint  Patient presents with   Follow-up    Mood   Diabetes    HPI Jessica Chang presents for follow up on diabetes mellitus, hypertension and mood management.   Diabetes mellitus: Followed by endocrinology. Pt denies increased urination or thirst. Pt reports medication compliance. Reports hypoglycemic events- lowest 34. Tried Dexcom sensor for 10 days and endocrinologist is currently working with insurance for approval. States since Covid-19 infection has been having trouble with controlling her sugars. FBS 130-140. Was recently started on Humulin.   HTN: Pt denies chest pain, palpitations, dizziness or lower extremity swelling. Taking medication as directed without side effects. Pt follows a low salt diet. States has not resumed exercise regimen she used to follow.  Mood: Taking medication as directed without issues. Denies mood changes, SI/HI.  Past Medical History:  Diagnosis Date   Acute pancreatitis    Allergy    seasonal    Arthritis    2021 right hip   Cataract    2020   COVID    Diabetes ([place])    Gallstones    Hyperlipidemia    Hypertension    Pneumonia    Squamous cell carcinoma    skin-sx on 2021   Thyroid disease    2016    Past Surgical History:  Procedure Laterality Date   CATARACT EXTRACTION  2020   CERVICAL SPINE SURGERY     CESAREAN SECTION  2008   CHOLECYSTECTOMY  2005   HIP ARTHROSCOPY Right 2011, 2016   KNEE ARTHROSCOPY Right 1988   SKIN CANCER EXCISION  2021   TONSILECTOMY, ADENOIDECTOMY, BILATERAL MYRINGOTOMY AND TUBES  1989   TONSILLECTOMY  1989   WISDOM TOOTH EXTRACTION  1990    Family History  Problem Relation Age of Onset   Heart disease Mother    Basal cell carcinoma Mother    Diabetes Father    Hyperlipidemia Father    Hypertension Father    Colon polyps Father 72   Prostate  cancer Father 14   Melanoma Father    Aneurysm Father    AAA (abdominal aortic aneurysm) Father 27       Thoracic aorta repair and AVR   Alcohol abuse Sister    Mental illness Sister    Asthma Sister    Urticaria Sister    Allergic rhinitis Sister    Heart attack Paternal Aunt    Melanoma Paternal Aunt    Breast cancer Maternal Grandmother    AAA (abdominal aortic aneurysm) Maternal Grandfather    Diabetes Paternal Grandmother    Stroke Paternal Grandfather    Melanoma Paternal Grandfather    Colon cancer Neg Hx    Esophageal cancer Neg Hx    Rectal cancer Neg Hx    Stomach cancer Neg Hx     Social History   Socioeconomic History   Marital status: Married    Spouse name: Not on file   Number of children: 1   Years of education: Not on file   Highest education level: Not on file  Occupational History   Occupation: physical therapy  Tobacco Use   Smoking status: Former    Packs/day: 0.25    Years: 5.00    Pack years: 1.25    Types: Cigarettes    Quit date: 09/19/2005    Years since quitting: 66.7  Smokeless tobacco: Never   Tobacco comments:    Quit 2007  Vaping Use   Vaping Use: Never used  Substance and Sexual Activity   Alcohol use: Yes    Comment: occassionally   Drug use: No   Sexual activity: Yes    Birth control/protection: I.U.D.  Other Topics Concern   Not on file  Social History Narrative   Not on file   Social Determinants of Health   Financial Resource Strain: Not on file  Food Insecurity: Not on file  Transportation Needs: Not on file  Physical Activity: Not on file  Stress: Not on file  Social Connections: Not on file  Intimate Partner Violence: Not on file    Outpatient Medications Prior to Visit  Medication Sig Dispense Refill   aspirin EC 81 MG tablet Take 1 tablet (81 mg total) by mouth daily. Swallow whole. 30 tablet 11   cetirizine (ZYRTEC) 10 MG tablet Take 10 mg by mouth daily.     EPINEPHrine 0.3 mg/0.3 mL IJ SOAJ injection  Inject 0.3 mLs (0.3 mg total) into the muscle as needed for anaphylaxis. 2 each 1   FLUoxetine (PROZAC) 40 MG capsule Take 1 capsule (40 mg total) by mouth daily. 90 capsule 1   glimepiride (AMARYL) 2 MG tablet Take 2 mg by mouth daily.     glucose blood test strip Check 2 hour postprandial and fasting blood sugars daily; also if you feel poorly 100 each 12   hydrochlorothiazide (MICROZIDE) 12.5 MG capsule TAKE 1 CAPSULE BY MOUTH EVERY DAY 90 capsule 3   hydrocortisone 2.5 % cream Apply 1 application topically daily as needed (skin irritation).      Ibuprofen 200 MG CAPS Take 600 mg by mouth daily as needed (pain).      levonorgestrel (MIRENA) 20 MCG/24HR IUD 1 each by Intrauterine route continuous.      levothyroxine (SYNTHROID) 88 MCG tablet Take 88 mcg by mouth daily before breakfast.      lisinopril (PRINIVIL,ZESTRIL) 20 MG tablet Take 20 mg by mouth daily.  6   metFORMIN (GLUCOPHAGE-XR) 500 MG 24 hr tablet Take 1,000 mg by mouth in the morning and at bedtime.     Multiple Vitamins-Minerals (HAIR/SKIN/NAILS/BIOTIN PO) Take 1 tablet by mouth daily.      rosuvastatin (CRESTOR) 10 MG tablet Take 10 mg by mouth daily.     Cholecalciferol (VITAMIN D3) 5000 units TABS 5,000 IU OTC vitamin D3 daily. 90 tablet 3   nitrofurantoin, macrocrystal-monohydrate, (MACROBID) 100 MG capsule Take 1 capsule (100 mg total) by mouth 2 (two) times daily. 10 capsule 0   No facility-administered medications prior to visit.    Allergies  Allergen Reactions   Januvia [Sitagliptin] Other (See Comments)    Pancreatitis   Onglyza [Saxagliptin] Other (See Comments)    Pancreatitis to all DPP 4 inhibitor medications   Cherry Swelling and Itching   Lidocaine     Other reaction(s): Other (See Comments) Blisters   Peach [Prunus Persica] Other (See Comments)    Facial tingling   Saxagliptin-Metformin Er Other (See Comments)    ROS Review of Systems Review of Systems:  A fourteen system review of systems was  performed and found to be positive as per HPI.   Objective:    Physical Exam General:  Well Developed, well nourished, appropriate for stated age.  Neuro:  Alert and oriented,  extra-ocular muscles intact  HEENT:  Normocephalic, atraumatic, neck supple Skin:  no gross rash, warm, pink. Cardiac:  RRR, S1 S2 Respiratory: CTA B/L, Not using accessory muscles, speaking in full sentences- unlabored. Vascular:  Ext warm, no cyanosis apprec.; cap RF less 2 sec. No edema Psych:  No HI/SI, judgement and insight good, Euthymic mood. Full Affect.  BP 105/69   Pulse 68   Temp 97.6 F (36.4 C)   Ht 5\' 7"  (1.702 m)   Wt 180 lb (81.6 kg)   SpO2 100%   BMI 28.19 kg/m  Wt Readings from Last 3 Encounters:  06/30/21 180 lb (81.6 kg)  12/29/20 187 lb 11.2 oz (85.1 kg)  12/11/20 181 lb (82.1 kg)     Health Maintenance Due  Topic Date Due   Hepatitis C Screening  Never done   PAP SMEAR-Modifier  07/01/2017   TETANUS/TDAP  09/19/2017   FOOT EXAM  06/19/2018   COVID-19 Vaccine (4 - Booster) 11/04/2020   INFLUENZA VACCINE  04/19/2021    There are no preventive care reminders to display for this patient.  Lab Results  Component Value Date   TSH 1.75 12/18/2018   Lab Results  Component Value Date   WBC 7.1 08/18/2020   HGB 12.5 08/18/2020   HCT 37.1 08/18/2020   MCV 89.8 08/18/2020   PLT 279 08/18/2020   Lab Results  Component Value Date   NA 137 09/08/2020   K 5.4 (H) 09/08/2020   CO2 26 09/08/2020   GLUCOSE 115 (H) 09/08/2020   BUN 13 09/08/2020   CREATININE 0.75 09/08/2020   BILITOT 0.6 08/18/2020   ALKPHOS 58 08/18/2020   AST 30 08/18/2020   ALT 43 08/18/2020   PROT 7.8 08/18/2020   ALBUMIN 5.0 08/18/2020   CALCIUM 10.3 (H) 09/08/2020   ANIONGAP 10 08/18/2020   Lab Results  Component Value Date   CHOL 186 10/11/2018   Lab Results  Component Value Date   HDL 46 10/11/2018   Lab Results  Component Value Date   LDLCALC 116 10/11/2018   Lab Results  Component  Value Date   TRIG 122 10/11/2018   Lab Results  Component Value Date   CHOLHDL 3.9 06/16/2017   Lab Results  Component Value Date   HGBA1C 6.4 (A) 06/11/2020   Depression screen PHQ 2/9 06/30/2021 12/29/2020 06/11/2020 12/11/2019 04/24/2019  Decreased Interest 0 0 0 0 0  Down, Depressed, Hopeless 0 0 0 0 0  PHQ - 2 Score 0 0 0 0 0  Altered sleeping 1 0 1 0 0  Tired, decreased energy 1 0 1 0 0  Change in appetite 0 0 0 0 0  Feeling bad or failure about yourself  1 0 0 0 0  Trouble concentrating 0 0 0 0 3  Moving slowly or fidgety/restless 0 0 0 0 0  Suicidal thoughts 0 0 0 0 0  PHQ-9 Score 3 0 2 0 3  Difficult doing work/chores Not difficult at all - Not difficult at all - Somewhat difficult   GAD 7 : Generalized Anxiety Score 06/30/2021 04/24/2019  Nervous, Anxious, on Edge 1 0  Control/stop worrying 0 0  Worry too much - different things 0 0  Trouble relaxing 0 0  Restless 0 0  Easily annoyed or irritable 0 0  Afraid - awful might happen 0 0  Total GAD 7 Score 1 0  Anxiety Difficulty Not difficult at all Not difficult at all        Assessment & Plan:   Problem List Items Addressed This Visit       Cardiovascular  and Mediastinum   Hypertension associated with diabetes ([place]) (Chronic)    -Controlled. -Continue current medication regimen.         Endocrine   Diabetes ([place]) - Primary (Chronic)    -Followed by Endocrinology. -A1c 06/01/2021 was 7.7, continue to follow up with Endocrinology. -Encourage to resume physical activity level. -Will continue to monitor.      Thyroid disease- management per endocrinology Dr. Soyla Murphy (Chronic)    -Followed by Endocrinology. -On levothyroxine 88 mcg.       Hyperlipidemia associated with type 2 diabetes mellitus ([place])    -Patient had lipid panel obtained with endocrinology 06/01/2021, will attempt to request lab copy. -Continue rosuvastatin 10 mg. -Recommend to resume physical activity regimen and follow a low fat diet.       Hyperpituitarism ([place])    -Followed by Endocrinology.        Other   Adjustment disorder   Adjustment disorder: -Stable. -Continue current medication regimen. -Will continue to monitor.   No orders of the defined types were placed in this encounter.   Follow-up: Return in about 6 months (around 12/29/2021) for DM, Mood, HTN.    Lorrene Reid, PA-C

## 2021-06-30 NOTE — Assessment & Plan Note (Signed)
Controlled. Continue current medication regimen.

## 2021-06-30 NOTE — Assessment & Plan Note (Signed)
-  Followed by Endocrinology. -On levothyroxine 88 mcg.

## 2021-07-15 ENCOUNTER — Other Ambulatory Visit: Payer: Self-pay | Admitting: Physician Assistant

## 2021-07-15 ENCOUNTER — Encounter: Payer: Self-pay | Admitting: Physician Assistant

## 2021-07-15 DIAGNOSIS — F4320 Adjustment disorder, unspecified: Secondary | ICD-10-CM

## 2021-10-13 LAB — HM DIABETES EYE EXAM

## 2021-12-08 DIAGNOSIS — E039 Hypothyroidism, unspecified: Secondary | ICD-10-CM | POA: Diagnosis not present

## 2021-12-08 DIAGNOSIS — E119 Type 2 diabetes mellitus without complications: Secondary | ICD-10-CM | POA: Diagnosis not present

## 2021-12-08 DIAGNOSIS — E7800 Pure hypercholesterolemia, unspecified: Secondary | ICD-10-CM | POA: Diagnosis not present

## 2021-12-09 LAB — COMPREHENSIVE METABOLIC PANEL
ALBUMIN/GLOBULIN RATIO: 2.1
ALT(SGPT): 108 — AB (ref 3–30)
AST: 48
Albumin: 5.2
Alkaline Phosphatase: 81
BUN: 22
Bilirubin: <0.2
Calcium: 10.4
Carbon Dioxide, Total: 29
Chloride: 100
Creat: 0.84
GFR calc Af Amer: 98
GFR calc non Af Amer: 81
Glucose: 124
Potassium: 4.7
Sodium: 140
Total Protein: 7.7 g/dL

## 2021-12-09 LAB — LIPID PANEL WITH LDL/HDL RATIO
Chol/HDL Ratio: 4.05
Cholesterol, Total: 174
HDL Cholesterol: 43 (ref 35–70)
LDL-Cholesterol: 87
LDL/HDL Ratio: 2
NON HDL CHOLESTEROL: 131
Triglycerides: 221 — AB (ref 40–160)

## 2021-12-09 LAB — HEMOGLOBIN A1C: Hemoglobin-A1c: 7.8

## 2021-12-09 LAB — C-PEPTIDE: C-Peptide: 8.65

## 2021-12-09 LAB — BASIC METABOLIC PANEL
BUN: 19
Calcium: 10.2
Carbon Dioxide, Total: 28
Chloride: 100
Creat: 0.8
GFR calc Af Amer: 99
GFR calc non Af Amer: 86
Glucose: 198
Potassium: 4.9
Sodium: 140

## 2021-12-09 LAB — TSH: TSH: 0.32

## 2021-12-09 LAB — PTH, INTACT: PTH, Intact: 41.9

## 2021-12-09 LAB — CALCIUM, IONIZED: Calcium, Ion: 5.1

## 2021-12-09 LAB — VITAMIN D 25 HYDROXY (VIT D DEFICIENCY, FRACTURES): Vitamin D, 25-Hydroxy: 49.2

## 2021-12-09 LAB — GAD-65 AUTOANTIBODY: GAD65 Antibody: <5

## 2021-12-09 LAB — INSULIN, FASTING: Insulin, Fasting: 90.8

## 2021-12-15 DIAGNOSIS — E7800 Pure hypercholesterolemia, unspecified: Secondary | ICD-10-CM | POA: Diagnosis not present

## 2021-12-15 DIAGNOSIS — E119 Type 2 diabetes mellitus without complications: Secondary | ICD-10-CM | POA: Diagnosis not present

## 2021-12-15 DIAGNOSIS — E039 Hypothyroidism, unspecified: Secondary | ICD-10-CM | POA: Diagnosis not present

## 2021-12-15 DIAGNOSIS — I10 Essential (primary) hypertension: Secondary | ICD-10-CM | POA: Diagnosis not present

## 2021-12-16 DIAGNOSIS — E119 Type 2 diabetes mellitus without complications: Secondary | ICD-10-CM | POA: Diagnosis not present

## 2021-12-29 NOTE — Progress Notes (Signed)
?Established patient visit ? ? ?Patient: Jessica Chang   DOB: 03/17/1971   51 y.o. Female  MRN: [id number] ?Visit Date: 12/30/2021 ? ?Chief Complaint  ?Patient presents with  ? Follow-up  ? ?Subjective  ?  ?HPI  ?Patient presents for follow-up on diabetes mellitus, hypertension and mood.  ? ?Diabetes: Followed by endocrinology, Dr.Balan. Pt denies increased urination or thirst. Pt reports medication compliance. States Metformin was discontinued due to elevated liver enzymes and Humulin was increased to 14 units BID. Has Dexcom sensor which has been quite helpful with monitoring her sugars but also learning what foods spike her sugars. Few hypoglycemic events. States her A1c was 7.8 (12/08/21). ? ?HTN: Pt denies chest pain, palpitations, dizziness or severe edema. Taking medication as directed without side effects. ? ?HLD: Pt taking medication as directed without issues. Reports her triglycerides were elevated at 221, bad cholesterol was 87, good cholesterol was 43.  ?  ?Mood: Reports medication compliance with Prozac 40 mg. Mood has been stable. Denies labile mood, SI or HI.  ? ? ?  06/30/2021  ?  3:21 PM 12/29/2020  ?  3:09 PM 06/11/2020  ?  3:25 PM 12/11/2019  ?  3:36 PM 04/24/2019  ?  2:57 PM  ?Depression screen PHQ 2/9  ?Decreased Interest 0 0 0 0 0  ?Down, Depressed, Hopeless 0 0 0 0 0  ?PHQ - 2 Score 0 0 0 0 0  ?Altered sleeping 1 0 1 0 0  ?Tired, decreased energy 1 0 1 0 0  ?Change in appetite 0 0 0 0 0  ?Feeling bad or failure about yourself  1 0 0 0 0  ?Trouble concentrating 0 0 0 0 3  ?Moving slowly or fidgety/restless 0 0 0 0 0  ?Suicidal thoughts 0 0 0 0 0  ?PHQ-9 Score 3 0 2 0 3  ?Difficult doing work/chores Not difficult at all  Not difficult at all  Somewhat difficult  ? ? ?  06/30/2021  ?  3:21 PM 04/24/2019  ?  2:57 PM  ?GAD 7 : Generalized Anxiety Score  ?Nervous, Anxious, on Edge 1 0  ?Control/stop worrying 0 0  ?Worry too much - different things 0 0  ?Trouble relaxing 0 0  ?Restless 0 0  ?Easily  annoyed or irritable 0 0  ?Afraid - awful might happen 0 0  ?Total GAD 7 Score 1 0  ?Anxiety Difficulty Not difficult at all Not difficult at all  ? ? ? ? ?Medications: ?Outpatient Medications Prior to Visit  ?Medication Sig  ? aspirin EC 81 MG tablet Take 1 tablet (81 mg total) by mouth daily. Swallow whole.  ? cetirizine (ZYRTEC) 10 MG tablet Take 10 mg by mouth daily.  ? EPINEPHrine 0.3 mg/0.3 mL IJ SOAJ injection Inject 0.3 mLs (0.3 mg total) into the muscle as needed for anaphylaxis.  ? glimepiride (AMARYL) 2 MG tablet Take 2 mg by mouth daily.  ? glucose blood test strip Check 2 hour postprandial and fasting blood sugars daily; also if you feel poorly  ? hydrochlorothiazide (MICROZIDE) 12.5 MG capsule TAKE 1 CAPSULE BY MOUTH EVERY DAY  ? hydrocortisone 2.5 % cream Apply 1 application topically daily as needed (skin irritation).   ? Ibuprofen 200 MG CAPS Take 600 mg by mouth daily as needed (pain).   ? Insulin NPH, Human,, Isophane, (HUMULIN N KWIKPEN) 100 UNIT/ML Kiwkpen 10u [place] BID  ? levonorgestrel (MIRENA) 20 MCG/24HR IUD 1 each by Intrauterine route continuous.   ? levothyroxine (SYNTHROID)  88 MCG tablet Take 88 mcg by mouth daily before breakfast.   ? lisinopril (PRINIVIL,ZESTRIL) 20 MG tablet Take 20 mg by mouth daily.  ? rosuvastatin (CRESTOR) 10 MG tablet Take 10 mg by mouth daily.  ? [DISCONTINUED] FLUoxetine (PROZAC) 40 MG capsule TAKE 1 CAPSULE (40 MG TOTAL) BY MOUTH DAILY.  ? [DISCONTINUED] metFORMIN (GLUCOPHAGE-XR) 500 MG 24 hr tablet Take 1,000 mg by mouth in the morning and at bedtime.  ? [DISCONTINUED] Multiple Vitamins-Minerals (HAIR/SKIN/NAILS/BIOTIN PO) Take 1 tablet by mouth daily.   ? ?No facility-administered medications prior to visit.  ? ? ?Review of Systems ?Review of Systems:  ?A fourteen system review of systems was performed and found to be positive as per HPI. ? ? ?  Objective  ?  ?BP 102/66   Pulse 78   Temp 98.3 ?F (36.8 ?C)   Wt 181 lb 14.4 oz (82.5 kg)   SpO2 99%   BMI  28.49 kg/m?  ?BP Readings from Last 3 Encounters:  ?12/30/21 102/66  ?06/30/21 105/69  ?12/29/20 105/70  ? ?Wt Readings from Last 3 Encounters:  ?12/30/21 181 lb 14.4 oz (82.5 kg)  ?06/30/21 180 lb (81.6 kg)  ?12/29/20 187 lb 11.2 oz (85.1 kg)  ? ? ?Physical Exam  ?General:  Well Developed, well nourished, appropriate for stated age.  ?Neuro:  Alert and oriented,  extra-ocular muscles intact  ?HEENT:  Normocephalic, atraumatic, neck supple  ?Skin:  no gross rash, warm, pink. ?Cardiac:  RRR, S1 S2 ?Respiratory: CTA B/L  ?Vascular:  Ext warm, no cyanosis apprec.; cap RF less 2 sec. ?Psych:  No HI/SI, judgement and insight good, Euthymic mood. Full Affect. ? ? ?No results found for any visits on 12/30/21. ? Assessment & Plan  ?  ? ? ?Problem List Items Addressed This Visit   ? ?  ? Cardiovascular and Mediastinum  ? Hypertension associated with diabetes ([place]) (Chronic)  ? Relevant Medications  ? Insulin NPH, Human,, Isophane, (HUMULIN N KWIKPEN) 100 UNIT/ML Kiwkpen  ?  ? Endocrine  ? Diabetes ([place]) - Primary (Chronic)  ? Relevant Medications  ? Insulin NPH, Human,, Isophane, (HUMULIN N KWIKPEN) 100 UNIT/ML Kiwkpen  ? Hyperlipidemia associated with type 2 diabetes mellitus ([place])  ? Relevant Medications  ? Insulin NPH, Human,, Isophane, (HUMULIN N KWIKPEN) 100 UNIT/ML Kiwkpen  ?  ? Other  ? Adjustment disorder  ? Relevant Medications  ? FLUoxetine (PROZAC) 40 MG capsule  ? ?Other Visit Diagnoses   ? ? Need for shingles vaccine      ? Relevant Orders  ? Varicella-zoster vaccine IM (Completed)  ? ?  ? ?Diabetes: ?-Will request most recent labs and visit from [facility]-Endocrinology. ?-Continue current medication regimen and continuous glucose monitoring. Discussed hypoglycemia and management. ?-Continue to follow-up with Endocrinology. ? ?Hypertension associated with diabetes: ?-Controlled. Continue lisinopril 20 mg and HCTZ 12.5 mg. ?-Will request most recent labs from [place]. ?-Will continue to  monitor. ? ?Hyperlipidemia associated with type 2 diabetes mellitus: ?-Will request most recent labs from [place]. ?-Continue rosuvastatin 10 mg. ?-Continue a heart healthy diet.  ? ?Adjustment disorder: ?-Stable. ?-Continue current medication regimen. ?-Will continue to monitor. ? ? ?Return in about 6 months (around 07/01/2022) for DM, mood, HTN.  ?   ? ? ? ?Lorrene Reid, PA-C  ?[facility] ?[phone] (phone) ?[phone] (fax) ? ?[facility] ?

## 2021-12-30 ENCOUNTER — Encounter: Payer: Self-pay | Admitting: Physician Assistant

## 2021-12-30 ENCOUNTER — Ambulatory Visit: Payer: BC Managed Care – PPO | Admitting: Physician Assistant

## 2021-12-30 VITALS — BP 102/66 | HR 78 | Temp 98.3°F | Wt 181.9 lb

## 2021-12-30 DIAGNOSIS — Z23 Encounter for immunization: Secondary | ICD-10-CM | POA: Diagnosis not present

## 2021-12-30 DIAGNOSIS — E1169 Type 2 diabetes mellitus with other specified complication: Secondary | ICD-10-CM

## 2021-12-30 DIAGNOSIS — E119 Type 2 diabetes mellitus without complications: Secondary | ICD-10-CM

## 2021-12-30 DIAGNOSIS — F4320 Adjustment disorder, unspecified: Secondary | ICD-10-CM | POA: Diagnosis not present

## 2021-12-30 DIAGNOSIS — E785 Hyperlipidemia, unspecified: Secondary | ICD-10-CM

## 2021-12-30 DIAGNOSIS — I152 Hypertension secondary to endocrine disorders: Secondary | ICD-10-CM

## 2021-12-30 DIAGNOSIS — E1159 Type 2 diabetes mellitus with other circulatory complications: Secondary | ICD-10-CM

## 2021-12-30 MED ORDER — FLUOXETINE HCL 40 MG PO CAPS: 40.0000 mg | ORAL_CAPSULE | Freq: Every day | ORAL | 1 refills | Status: DC

## 2022-01-11 ENCOUNTER — Encounter: Payer: Self-pay | Admitting: Physician Assistant

## 2022-02-02 ENCOUNTER — Ambulatory Visit (HOSPITAL_COMMUNITY): Payer: BC Managed Care – PPO | Attending: Cardiology

## 2022-02-02 DIAGNOSIS — I712 Thoracic aortic aneurysm, without rupture, unspecified: Secondary | ICD-10-CM

## 2022-02-02 LAB — ECHOCARDIOGRAM COMPLETE
Area-P 1/2: 2.74 cm2
P 1/2 time: 450 msec
S' Lateral: 2.8 cm

## 2022-02-08 DIAGNOSIS — L400 Psoriasis vulgaris: Secondary | ICD-10-CM | POA: Diagnosis not present

## 2022-02-08 DIAGNOSIS — D2261 Melanocytic nevi of right upper limb, including shoulder: Secondary | ICD-10-CM | POA: Diagnosis not present

## 2022-02-08 DIAGNOSIS — L720 Epidermal cyst: Secondary | ICD-10-CM | POA: Diagnosis not present

## 2022-02-08 DIAGNOSIS — L570 Actinic keratosis: Secondary | ICD-10-CM | POA: Diagnosis not present

## 2022-02-08 DIAGNOSIS — Z85828 Personal history of other malignant neoplasm of skin: Secondary | ICD-10-CM | POA: Diagnosis not present

## 2022-02-27 ENCOUNTER — Other Ambulatory Visit: Payer: Self-pay | Admitting: Internal Medicine

## 2022-02-27 DIAGNOSIS — R0609 Other forms of dyspnea: Secondary | ICD-10-CM

## 2022-02-27 DIAGNOSIS — R079 Chest pain, unspecified: Secondary | ICD-10-CM

## 2022-02-27 DIAGNOSIS — Z8249 Family history of ischemic heart disease and other diseases of the circulatory system: Secondary | ICD-10-CM

## 2022-02-27 DIAGNOSIS — E1169 Type 2 diabetes mellitus with other specified complication: Secondary | ICD-10-CM

## 2022-02-27 DIAGNOSIS — R072 Precordial pain: Secondary | ICD-10-CM

## 2022-02-27 DIAGNOSIS — I152 Hypertension secondary to endocrine disorders: Secondary | ICD-10-CM

## 2022-02-27 DIAGNOSIS — E785 Hyperlipidemia, unspecified: Secondary | ICD-10-CM

## 2022-03-29 ENCOUNTER — Other Ambulatory Visit: Payer: Self-pay | Admitting: Internal Medicine

## 2022-03-29 DIAGNOSIS — R0609 Other forms of dyspnea: Secondary | ICD-10-CM

## 2022-03-29 DIAGNOSIS — E1169 Type 2 diabetes mellitus with other specified complication: Secondary | ICD-10-CM

## 2022-03-29 DIAGNOSIS — R079 Chest pain, unspecified: Secondary | ICD-10-CM

## 2022-03-29 DIAGNOSIS — R072 Precordial pain: Secondary | ICD-10-CM

## 2022-03-29 DIAGNOSIS — Z8249 Family history of ischemic heart disease and other diseases of the circulatory system: Secondary | ICD-10-CM

## 2022-03-29 DIAGNOSIS — E1159 Type 2 diabetes mellitus with other circulatory complications: Secondary | ICD-10-CM

## 2022-04-12 ENCOUNTER — Other Ambulatory Visit: Payer: Self-pay | Admitting: Internal Medicine

## 2022-04-12 DIAGNOSIS — E1169 Type 2 diabetes mellitus with other specified complication: Secondary | ICD-10-CM

## 2022-04-12 DIAGNOSIS — I152 Hypertension secondary to endocrine disorders: Secondary | ICD-10-CM

## 2022-04-12 DIAGNOSIS — R079 Chest pain, unspecified: Secondary | ICD-10-CM

## 2022-04-12 DIAGNOSIS — R072 Precordial pain: Secondary | ICD-10-CM

## 2022-04-12 DIAGNOSIS — Z8249 Family history of ischemic heart disease and other diseases of the circulatory system: Secondary | ICD-10-CM

## 2022-04-12 DIAGNOSIS — R0609 Other forms of dyspnea: Secondary | ICD-10-CM

## 2022-04-20 ENCOUNTER — Ambulatory Visit
Admission: EM | Admit: 2022-04-20 | Discharge: 2022-04-20 | Disposition: A | Discharge status: Home / Self Care | Payer: BC Managed Care – PPO | Attending: Internal Medicine | Admitting: Internal Medicine

## 2022-04-20 DIAGNOSIS — R051 Acute cough: Secondary | ICD-10-CM | POA: Diagnosis not present

## 2022-04-20 DIAGNOSIS — J069 Acute upper respiratory infection, unspecified: Secondary | ICD-10-CM

## 2022-04-20 MED ORDER — BENZONATATE 100 MG PO CAPS: 100.0000 mg | ORAL_CAPSULE | Freq: Three times a day (TID) | ORAL | 0 refills | Status: DC | PRN

## 2022-04-20 MED ORDER — FLUTICASONE PROPIONATE 50 MCG/ACT NA SUSP: 1.0000 | Freq: Every day | NASAL | 0 refills | Status: AC

## 2022-04-20 MED ORDER — AMOXICILLIN-POT CLAVULANATE 875-125 MG PO TABS: 1.0000 | ORAL_TABLET | Freq: Two times a day (BID) | ORAL | 0 refills | Status: DC

## 2022-04-20 NOTE — ED Provider Notes (Signed)
[facility]    CSN: [id number] Arrival date & time: 04/20/22  2951      History   Chief Complaint Chief Complaint  Patient presents with   Cough    HPI Jessica Chang is a 51 y.o. female.   Patient presents with nasal congestion, bilateral ear discomfort, cough that has been present for about 10 days.  Denies any associated sore throat, fever, chest pain, shortness of breath, nausea, vomiting, diarrhea, abdominal pain.  Patient reports the cough is productive and nonproductive at times.  Denies any known sick contacts.  Denies history of asthma or COPD and patient is not a smoker.  She has been taking Mucinex for symptoms with minimal improvement.   Cough   Past Medical History:  Diagnosis Date   Acute pancreatitis    Allergy    seasonal    Arthritis    2021 right hip   Cataract    2020   COVID    Diabetes ([place])    Gallstones    Hyperlipidemia    Hypertension    Pneumonia    Squamous cell carcinoma    skin-sx on 2021   Thyroid disease    2016    Patient Active Problem List   Diagnosis Date Noted   Thoracic aortic aneurysm without rupture ([place]) 04/05/2021   Type 2 diabetes mellitus without complications ([place]) 88/41/6606   Pancreatitis 12/29/2020   Hyperpituitarism ([place]) 12/29/2020   Food poisoning 12/29/2020   Abnormal results of liver function studies 12/29/2020   Coronary artery disease involving native coronary artery of native heart without angina pectoris 12/11/2020   Family history of aortic aneurysm 08/27/2020   Hypercholesterolemia 03/17/2020   Malignant neoplasm of skin 03/17/2020   Arm paresthesia, right 01/29/2020   Thyroid disease- management per endocrinology Dr. Soyla Murphy    Multiple allergies 05/18/2019   Pain in left arm 10/23/2018   Paresthesia of upper limb 10/23/2018   Right serous otitis media 03/20/2018   Acute otitis externa of right ear 03/20/2018   Vitamin D deficiency 06/19/2017   Adjustment disorder 03/06/2017    Diabetic cataract ([place]) 08/31/2016   h/o Drug-induced acute pancreatitis (2nd januvia or onglyza) 05/27/2016   Elevated liver enzymes 05/27/2016   Chronic Mild Hypercalcemia 05/27/2016   Overweight (BMI 25.0-29.9) 02/21/2016   Pain, abdominal, nonspecific 02/21/2016   Skin lesion 02/21/2016   Enthesopathy of hip 10/19/2014   Cramp in muscle 08/13/2013   Hyperlipidemia associated with type 2 diabetes mellitus ([place]) 10/29/2012   Hypertension associated with diabetes ([place]) 10/29/2012   Diabetes ([place])     Past Surgical History:  Procedure Laterality Date   CATARACT EXTRACTION  2020   CERVICAL SPINE SURGERY     CESAREAN SECTION  2008   CHOLECYSTECTOMY  2005   HIP ARTHROSCOPY Right 2011, 2016   KNEE ARTHROSCOPY Right 1988   SKIN CANCER EXCISION  2021   TONSILECTOMY, ADENOIDECTOMY, BILATERAL MYRINGOTOMY AND TUBES  1989   TONSILLECTOMY  1989   WISDOM TOOTH EXTRACTION  1990    OB History   No obstetric history on file.      Home Medications    Prior to Admission medications   Medication Sig Start Date End Date Taking? Authorizing Provider  amoxicillin-clavulanate (AUGMENTIN) 875-125 MG tablet Take 1 tablet by mouth every 12 (twelve) hours. 04/20/22  Yes Donell Sliwinski, Hildred Alamin E, FNP  benzonatate (TESSALON) 100 MG capsule Take 1 capsule (100 mg total) by mouth every 8 (eight) hours as needed for cough. 04/20/22  Yes  Livie Vanderhoof, Hildred Alamin E, FNP  fluticasone [facility]) 50 MCG/ACT nasal spray Place 1 spray into both nostrils daily for 3 days. 04/20/22 04/23/22 Yes Teodora Medici, FNP  aspirin EC 81 MG tablet Take 1 tablet (81 mg total) by mouth daily. Swallow whole. 12/11/20   Werner Lean, MD  cetirizine (ZYRTEC) 10 MG tablet Take 10 mg by mouth daily.    [provider]  EPINEPHrine 0.3 mg/0.3 mL IJ SOAJ injection Inject 0.3 mLs (0.3 mg total) into the muscle as needed for anaphylaxis. 04/24/19   Opalski, Neoma Laming, DO  FLUoxetine (PROZAC) 40 MG capsule Take 1 capsule (40 mg total) by mouth  daily. 12/30/21   Abonza, Maritza, PA-C  glimepiride (AMARYL) 2 MG tablet Take 2 mg by mouth daily. 08/06/20   [provider]  glucose blood test strip Check 2 hour postprandial and fasting blood sugars daily; also if you feel poorly 05/25/16   Opalski, Neoma Laming, DO  hydrochlorothiazide (MICROZIDE) 12.5 MG capsule Take 1 capsule (12.5 mg total) by mouth daily. CALL AND SCHEDULE FOLLOW UP OFFICE VISIT TO RECEIVE FURTHER REFILLS. [phone]. 2ND ATTEMPT. 03/29/22   Rudean Haskell A, MD  hydrocortisone 2.5 % cream Apply 1 application topically daily as needed (skin irritation).  07/20/20   [provider]  Ibuprofen 200 MG CAPS Take 600 mg by mouth daily as needed (pain).     [provider]  Insulin NPH, Human,, Isophane, (HUMULIN N KWIKPEN) 100 UNIT/ML Kiwkpen 10u [place] BID    [provider]  levonorgestrel (MIRENA) 20 MCG/24HR IUD 1 each by Intrauterine route continuous.  02/05/10   [provider]  levothyroxine (SYNTHROID) 88 MCG tablet Take 88 mcg by mouth daily before breakfast.  04/26/19   [provider]  lisinopril (PRINIVIL,ZESTRIL) 20 MG tablet Take 20 mg by mouth daily. 04/03/17   [provider]  rosuvastatin (CRESTOR) 10 MG tablet Take 10 mg by mouth daily. 10/20/20   [provider]    Family History Family History  Problem Relation Age of Onset   Heart disease Mother    Basal cell carcinoma Mother    Diabetes Father    Hyperlipidemia Father    Hypertension Father    Colon polyps Father 33   Prostate cancer Father 64   Melanoma Father    Aneurysm Father    AAA (abdominal aortic aneurysm) Father 6       Thoracic aorta repair and AVR   Alcohol abuse Sister    Mental illness Sister    Asthma Sister    Urticaria Sister    Allergic rhinitis Sister    Heart attack Paternal Aunt    Melanoma Paternal Aunt    Breast cancer Maternal Grandmother    AAA (abdominal aortic aneurysm) Maternal Grandfather     Diabetes Paternal Grandmother    Stroke Paternal Grandfather    Melanoma Paternal Grandfather    Colon cancer Neg Hx    Esophageal cancer Neg Hx    Rectal cancer Neg Hx    Stomach cancer Neg Hx     Social History Social History   Tobacco Use   Smoking status: Former    Packs/day: 0.25    Years: 5.00    Total pack years: 1.25    Types: Cigarettes    Quit date: 09/19/2005    Years since quitting: 16.5   Smokeless tobacco: Never   Tobacco comments:    Quit 2007  Vaping Use   Vaping Use: Never used  Substance Use Topics  Alcohol use: Yes    Comment: occassionally   Drug use: No     Allergies   Januvia [sitagliptin], Onglyza [saxagliptin], Cherry, Lidocaine, Peach [prunus persica], and Saxagliptin-metformin er   Review of Systems Review of Systems Per HPI  Physical Exam Triage Vital Signs ED Triage Vitals [04/20/22 0827]  Enc Vitals Group     BP 120/80     Pulse Rate 77     Resp 18     Temp 98 F (36.7 C)     Temp Source Oral     SpO2 97 %     Weight      Height      Head Circumference      Peak Flow      Pain Score 0     Pain Loc      Pain Edu?      Excl. in [place]?    No data found.  Updated Vital Signs BP 120/80 (BP Location: Left Arm)   Pulse 77   Temp 98 F (36.7 C) (Oral)   Resp 18   SpO2 97%   Visual Acuity Right Eye Distance:   Left Eye Distance:   Bilateral Distance:    Right Eye Near:   Left Eye Near:    Bilateral Near:     Physical Exam Constitutional:      General: She is not in acute distress.    Appearance: Normal appearance. She is not toxic-appearing or diaphoretic.  HENT:     Head: Normocephalic and atraumatic.     Right Ear: Tympanic membrane and ear canal normal.     Left Ear: Tympanic membrane and ear canal normal.     Nose: Congestion present.     Mouth/Throat:     Mouth: Mucous membranes are moist.     Pharynx: No posterior oropharyngeal erythema.  Eyes:     Extraocular Movements: Extraocular movements intact.      Conjunctiva/sclera: Conjunctivae normal.     Pupils: Pupils are equal, round, and reactive to light.  Cardiovascular:     Rate and Rhythm: Normal rate and regular rhythm.     Pulses: Normal pulses.     Heart sounds: Normal heart sounds.  Pulmonary:     Effort: Pulmonary effort is normal. No respiratory distress.     Breath sounds: Normal breath sounds. No stridor. No wheezing, rhonchi or rales.  Abdominal:     General: Abdomen is flat. Bowel sounds are normal.     Palpations: Abdomen is soft.  Musculoskeletal:        General: Normal range of motion.     Cervical back: Normal range of motion.  Skin:    General: Skin is warm and dry.  Neurological:     General: No focal deficit present.     Mental Status: She is alert and oriented to person, place, and time. Mental status is at baseline.  Psychiatric:        Mood and Affect: Mood normal.        Behavior: Behavior normal.      UC Treatments / Results  Labs (all labs ordered are listed, but only abnormal results are displayed) Labs Reviewed - No data to display  EKG   Radiology No results found.  Procedures Procedures (including critical care time)  Medications Ordered in UC Medications - No data to display  Initial Impression / Assessment and Plan / UC Course  I have reviewed the triage vital signs and the nursing notes.  Pertinent  labs & imaging results that were available during my care of the patient were reviewed by me and considered in my medical decision making (see chart for details).     Symptoms are most likely viral, although given duration of symptoms will opt to treat with antibiotic.  Do not think chest imaging is necessary given no adventitious lung sounds found on exam.  Benzonatate and Flonase also prescribed for patient to take for symptoms.  Do not think viral testing is necessary given duration of symptoms.  Patient was given strict return precautions.  Patient verbalized understanding and was  agreeable with plan. Final Clinical Impressions(s) / UC Diagnoses   Final diagnoses:  Acute upper respiratory infection  Acute cough     Discharge Instructions      You have been prescribed 3 medications to help alleviate symptoms.  Please follow-up if symptoms persist or worsen.  Instead of azithromycin, I am going to prescribe Augmentin antibiotic instead which will be just as helpful.    ED Prescriptions     Medication Sig Dispense Auth. Provider   amoxicillin-clavulanate (AUGMENTIN) 875-125 MG tablet Take 1 tablet by mouth every 12 (twelve) hours. 14 tablet [place], [place] E, [place]   fluticasone [facility]) 50 MCG/ACT nasal spray Place 1 spray into both nostrils daily for 3 days. 16 g [place], Hildred Alamin E, [place]   benzonatate (TESSALON) 100 MG capsule Take 1 capsule (100 mg total) by mouth every 8 (eight) hours as needed for cough. 21 capsule [place], Michele Rockers,       PDMP not reviewed this encounter.   Teodora Medici,  04/20/22 [phone]

## 2022-04-20 NOTE — Discharge Instructions (Addendum)
You have been prescribed 3 medications to help alleviate symptoms.  Please follow-up if symptoms persist or worsen.  Instead of azithromycin, I am going to prescribe Augmentin antibiotic instead which will be just as helpful.

## 2022-04-20 NOTE — ED Triage Notes (Signed)
Pt c/o bilat ear pressure, nasal congestion, cough x 10 days

## 2022-04-22 ENCOUNTER — Encounter: Payer: Self-pay | Admitting: Internal Medicine

## 2022-04-22 ENCOUNTER — Ambulatory Visit: Payer: BC Managed Care – PPO | Admitting: Internal Medicine

## 2022-04-22 VITALS — BP 115/78 | HR 73 | Ht 67.0 in | Wt 183.0 lb

## 2022-04-22 DIAGNOSIS — I152 Hypertension secondary to endocrine disorders: Secondary | ICD-10-CM

## 2022-04-22 DIAGNOSIS — Z8249 Family history of ischemic heart disease and other diseases of the circulatory system: Secondary | ICD-10-CM

## 2022-04-22 DIAGNOSIS — R079 Chest pain, unspecified: Secondary | ICD-10-CM

## 2022-04-22 DIAGNOSIS — E785 Hyperlipidemia, unspecified: Secondary | ICD-10-CM

## 2022-04-22 DIAGNOSIS — E1159 Type 2 diabetes mellitus with other circulatory complications: Secondary | ICD-10-CM | POA: Diagnosis not present

## 2022-04-22 DIAGNOSIS — E1169 Type 2 diabetes mellitus with other specified complication: Secondary | ICD-10-CM

## 2022-04-22 DIAGNOSIS — R072 Precordial pain: Secondary | ICD-10-CM

## 2022-04-22 DIAGNOSIS — I2510 Atherosclerotic heart disease of native coronary artery without angina pectoris: Secondary | ICD-10-CM | POA: Diagnosis not present

## 2022-04-22 DIAGNOSIS — R0609 Other forms of dyspnea: Secondary | ICD-10-CM

## 2022-04-22 MED ORDER — ROSUVASTATIN CALCIUM 20 MG PO TABS: 20.0000 mg | ORAL_TABLET | Freq: Every day | ORAL | 3 refills | Status: DC

## 2022-04-22 MED ORDER — HYDROCHLOROTHIAZIDE 12.5 MG PO CAPS: 12.5000 mg | ORAL_CAPSULE | Freq: Every day | ORAL | 3 refills | Status: DC

## 2022-04-22 NOTE — Progress Notes (Signed)
Cardiology Office Note:    Date:  04/22/2022   ID:  [name], DOB January 02, 1971, MRN [id number]  PCP:  Lorrene Reid, PA-C  [facility] Cardiologist: Rudean Haskell MD [place] Electrophysiologist:  None   CC: Nonobstructive CAD follow up  History of Present Illness:    Delorese Sellin is a 51 y.o. female with a hx of Diabetes with HTN, Chronic HTN of pregnancy (circa 2007)  HLD, Prior COVID infection in August who presents for evaluation 2022:  In interim of this visit, patient had mild non-obstructive CAD by Cardiac CT.  2023: Aorta looks ok in 2023  Patient notes that she is doing decent.   Since last visit notes that she has returned to working out with no issues . There are no interval hospital/ED visit.   Walking and weight training with no symptoms.  No chest pain or pressure with exercise; randomly on occasion has had nocturnal chest pain (twice in the same week)  Resolved.  No chest pains with food; but laying down night that improves at [place].   No SOB/DOE and no PND/Orthopnea.  No weight gain or leg swelling.  No palpitations or syncope.  Ambulatory BP ~ < 120/80s  Past Medical History:  Diagnosis Date   Acute pancreatitis    Allergy    seasonal    Arthritis    2021 right hip   Cataract    2020   COVID    Diabetes ([place])    Gallstones    Hyperlipidemia    Hypertension    Pneumonia    Squamous cell carcinoma    skin-sx on 2021   Thyroid disease    2016    Past Surgical History:  Procedure Laterality Date   CATARACT EXTRACTION  2020   CERVICAL SPINE SURGERY     CESAREAN SECTION  2008   CHOLECYSTECTOMY  2005   HIP ARTHROSCOPY Right 2011, 2016   KNEE ARTHROSCOPY Right 1988   SKIN CANCER EXCISION  2021   TONSILECTOMY, ADENOIDECTOMY, BILATERAL MYRINGOTOMY AND TUBES  1989   TONSILLECTOMY  1989   WISDOM TOOTH EXTRACTION  1990    Current Medications: Current Meds  Medication Sig   amoxicillin-clavulanate (AUGMENTIN) 875-125 MG tablet  Take 1 tablet by mouth every 12 (twelve) hours.   aspirin EC 81 MG tablet Take 1 tablet (81 mg total) by mouth daily. Swallow whole.   augmented betamethasone dipropionate (DIPROLENE-AF) 0.05 % cream Apply topically as needed. scoroisis   benzonatate (TESSALON) 100 MG capsule Take 1 capsule (100 mg total) by mouth every 8 (eight) hours as needed for cough.   cetirizine (ZYRTEC) 10 MG tablet Take 10 mg by mouth daily.   clobetasol (TEMOVATE) 0.05 % external solution Apply 1 Application topically as needed.   Continuous Blood Gluc Sensor (DEXCOM G7 SENSOR) MISC Inject into the skin once a week.   EPINEPHrine 0.3 mg/0.3 mL IJ SOAJ injection Inject 0.3 mLs (0.3 mg total) into the muscle as needed for anaphylaxis.   FLUoxetine (PROZAC) 40 MG capsule Take 1 capsule (40 mg total) by mouth daily.   fluticasone (FLONASE) 50 MCG/ACT nasal spray Place 1 spray into both nostrils daily for 3 days.   glimepiride (AMARYL) 2 MG tablet Take 2 mg by mouth daily.   glucose blood test strip Check 2 hour postprandial and fasting blood sugars daily; also if you feel poorly   hydrocortisone 2.5 % cream Apply 1 application topically daily as needed (skin irritation).    Ibuprofen 200 MG CAPS  Take 600 mg by mouth daily as needed (pain).    Insulin NPH, Human,, Isophane, (HUMULIN N KWIKPEN) 100 UNIT/ML Kiwkpen 10u [place] BID   JARDIANCE 25 MG TABS tablet Take 25 mg by mouth daily.   levonorgestrel (MIRENA) 20 MCG/24HR IUD 1 each by Intrauterine route continuous.    levothyroxine (SYNTHROID) 88 MCG tablet Take 88 mcg by mouth daily before breakfast.    lisinopril (PRINIVIL,ZESTRIL) 20 MG tablet Take 20 mg by mouth daily.   rosuvastatin (CRESTOR) 20 MG tablet Take 1 tablet (20 mg total) by mouth daily.   valACYclovir (VALTREX) 500 MG tablet Take 500 mg by mouth as needed. breakouts   [DISCONTINUED] hydrochlorothiazide (MICROZIDE) 12.5 MG capsule Take 1 capsule (12.5 mg total) by mouth daily. CALL AND SCHEDULE FOLLOW UP OFFICE  VISIT TO RECEIVE FURTHER REFILLS. [phone]. 2ND ATTEMPT.   [DISCONTINUED] rosuvastatin (CRESTOR) 10 MG tablet Take 10 mg by mouth daily.     Allergies:   Januvia [sitagliptin], Onglyza [saxagliptin], Cherry, Lidocaine, Peach [prunus persica], and Saxagliptin-metformin er   Social History   Socioeconomic History   Marital status: Married    Spouse name: Not on file   Number of children: 1   Years of education: Not on file   Highest education level: Not on file  Occupational History   Occupation: physical therapy  Tobacco Use   Smoking status: Former    Packs/day: 0.25    Years: 5.00    Total pack years: 1.25    Types: Cigarettes    Quit date: 09/19/2005    Years since quitting: 16.6   Smokeless tobacco: Never   Tobacco comments:    Quit 2007  Vaping Use   Vaping Use: Never used  Substance and Sexual Activity   Alcohol use: Yes    Comment: occassionally   Drug use: No   Sexual activity: Yes    Birth control/protection: I.U.D.  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  Family History: The patient's family history includes AAA (abdominal aortic aneurysm) in her maternal grandfather; AAA (abdominal aortic aneurysm) (age of onset: 41) in her father; Alcohol abuse in her sister; Allergic rhinitis in her sister; Aneurysm in her father; Asthma in her sister; Basal cell carcinoma in her mother; Breast cancer in her maternal grandmother; Colon polyps (age of onset: 56) in her father; Diabetes in her father and paternal grandmother; Heart attack in her paternal aunt; Heart disease in her mother; Hyperlipidemia in her father;  Hypertension in her father; Melanoma in her father, paternal aunt, and paternal grandfather; Mental illness in her sister; Prostate cancer (age of onset: 29) in her father; Stroke in her paternal grandfather; Urticaria in her sister. There is no history of Colon cancer, Esophageal cancer, Rectal cancer, or Stomach cancer. Father had Bental Procedure: for thoracic aortic aneursym. Mother had HFrecoveredEF  ROS:   Please see the history of present illness.    All other systems reviewed and are negative.  EKGs/Labs/Other Studies Reviewed:    The following studies were reviewed today:  EKG:   04/22/22: SR rate 73 WNL 08/19/20:  SR 91  WNL 08/27/20 SR 80 WNL  Transthoracic Echocardiogram: Date: Aorta ULN Results:  1. Left ventricular ejection fraction, by estimation, is 60 to 65%. Left  ventricular ejection fraction by 3D volume is 60 %. The left ventricle has  normal function. The left ventricle has no regional wall motion  abnormalities. Left ventricular diastolic   parameters were normal. The average left ventricular global longitudinal  strain is -20.2 %. The global longitudinal strain is normal.   2. Right ventricular systolic function is normal. The right ventricular  size is normal. Tricuspid regurgitation signal is inadequate for assessing  PA pressure.   3. The mitral valve is grossly normal. Trivial mitral valve  regurgitation. No evidence of mitral stenosis.   4. The aortic valve is tricuspid. Aortic valve regurgitation is not  visualized. No aortic stenosis is present.   5. There is mild dilatation of the ascending aorta, measuring 37 mm.   6. The inferior vena cava is normal in size with greater than 50%  respiratory variability, suggesting right atrial pressure of 3 mmHg.    Cardiac CT: Date:09/21/20 Results:  LAD system: Mixed plaque proximal and mid LAD, mild (<50%) stenosis.   Circumflex system: Mixed plaque proximal LCx, mild (<50%) stenosis.   RCA system: Mixed  plaque noted in the proximal, mid, and distal RCA. Stenosis appears mild (<50%).  IMPRESSION: No evidence for hemodynamically significant coronary disease.   Loralie Champagne    Recent Labs: 12/08/2021: ALT(SGPT) 108; BUN 19; BUN 22; Creat 0.8; Creat 0.84; Potassium 4.9; Potassium 4.7; Sodium 140; Sodium 140; TSH 0.32  Recent Lipid Panel    Component Value Date/Time   CHOL 174 12/08/2021 0000   TRIG 221 (A) 12/08/2021 0000   HDL 43 12/08/2021 0000   CHOLHDL 4.05 12/08/2021 0000   CHOLHDL 4.0 05/20/2016 0815   VLDL 28 05/20/2016 0815   LDLCALC 116 10/11/2018 0000   LDLCALC 108 (H) 06/16/2017 0850   Physical Exam:    VS:  BP 115/78   Pulse 73   Ht '5\' 7"'$  (1.702 m)   Wt 183 lb (83 kg)   SpO2 98%   BMI 28.66 kg/m     Wt Readings from Last 3 Encounters:  04/22/22 183 lb (83 kg)  12/30/21 181 lb 14.4 oz (82.5 kg)  06/30/21 180 lb (81.6 kg)    Gen: No distress Neck: No JVD Cardiac: No Rubs or Gallops, no murmur, RRRr + radial pulses Respiratory: Clear to auscultation bilaterally, normal effort, normal  respiratory rate GI: Soft, nontender, non-distended  MS: No  edema;  moves all extremities Integument: Skin feels  Neuro:  At time of evaluation, alert and oriented to person/place/time/situation  Psych: Normal affect, patient feels well  ASSESSMENT:    1. Coronary artery disease involving native coronary artery of native heart without angina pectoris   2. Hyperlipidemia associated with type 2 diabetes mellitus ([place])   3. Hypertension associated with diabetes ([place])   4. Chest pain of uncertain etiology   5. Family history of aortic aneurysm   6. Precordial pain   7. DOE (dyspnea on exertion)     PLAN:    Coronary Artery Disease Nonobstructive HLD with DM - asymptomatic  - on ASA 81 mg - continue statin, goal LDL <  vs 55, will increase dose to 20 mg PO daily, and labs in three months - she is already working out and has really worked to optimize her diet  Family  history of Thoracic and Abdominal Aortic Aneurysm - borderline  measurements, Echo in 2025  HTN with DM -HCTZ 12.5 mg PO daily- refill today - lisinopril 20 mg  One Year APP Two Years me   Medication Adjustments/Labs and Tests Ordered: Current medicines are reviewed at length with the patient today.  Concerns regarding medicines are outlined above.  Orders Placed This Encounter  Procedures   ALT   Lipid panel   EKG 12-Lead   Meds ordered this encounter  Medications   hydrochlorothiazide (MICROZIDE) 12.5 MG capsule    Sig: Take 1 capsule (12.5 mg total) by mouth daily. CALL AND SCHEDULE FOLLOW UP OFFICE VISIT TO RECEIVE FURTHER REFILLS. [phone]. 2ND ATTEMPT.    Dispense:  90 capsule    Refill:  3   rosuvastatin (CRESTOR) 20 MG tablet    Sig: Take 1 tablet (20 mg total) by mouth daily.    Dispense:  90 tablet    Refill:  3    Patient Instructions  Medication Instructions:  Your physician has recommended you make the following change in your medication:  INCREASE: rosuvastatin (Crestor) to 40 mg by mouth once daily REFILLED: HCTZ 12.5 mg  *If you need a refill on your cardiac medications before your next appointment, please call your pharmacy*   Lab Work: IN 3 MONTHS: fasting lipid panel and ALT ( nothing to eat or drink 8-12 hours prior except water/ black coffee)    If you have labs (blood work) drawn today and your tests are completely normal, you will receive your results only by: [place] (if you have MyChart) OR A paper copy in the mail If you have any lab test that is abnormal or we need to change your treatment, we will call you to review the results.   Testing/Procedures: NONE   Follow-Up: At [facility], you and your health needs are our priority.  As part of our continuing mission to provide you with exceptional heart care, we have created designated Provider Care Teams.  These Care Teams include your primary Cardiologist (physician) and  Advanced Practice Providers (APPs -  Physician Assistants and Nurse Practitioners) who all work together to provide you with the care you need, when you need it.   Your next appointment:   1 year(s)  The format for your next appointment:   In Person  Provider:   Melina Copa, PA-C, Ermalinda Barrios, PA-C, or Christen Bame, NP     Then, Rudean Haskell, MD will plan to see you again in 2 year(s).{  Important Information About Sugar         Signed, Werner Lean, MD  04/22/2022 3:38 PM    [facility]

## 2022-04-22 NOTE — Patient Instructions (Signed)
Medication Instructions:  Your physician has recommended you make the following change in your medication:  INCREASE: rosuvastatin (Crestor) to 40 mg by mouth once daily REFILLED: HCTZ 12.5 mg  *If you need a refill on your cardiac medications before your next appointment, please call your pharmacy*   Lab Work: IN 3 MONTHS: fasting lipid panel and ALT ( nothing to eat or drink 8-12 hours prior except water/ black coffee)    If you have labs (blood work) drawn today and your tests are completely normal, you will receive your results only by: [place] (if you have MyChart) OR A paper copy in the mail If you have any lab test that is abnormal or we need to change your treatment, we will call you to review the results.   Testing/Procedures: NONE   Follow-Up: At [facility], you and your health needs are our priority.  As part of our continuing mission to provide you with exceptional heart care, we have created designated Provider Care Teams.  These Care Teams include your primary Cardiologist (physician) and Advanced Practice Providers (APPs -  Physician Assistants and Nurse Practitioners) who all work together to provide you with the care you need, when you need it.   Your next appointment:   1 year(s)  The format for your next appointment:   In Person  Provider:   Melina Copa, PA-C, Ermalinda Barrios, PA-C, or Christen Bame, NP     Then, Rudean Haskell, MD will plan to see you again in 2 year(s).{  Important Information About Sugar

## 2022-06-13 DIAGNOSIS — D485 Neoplasm of uncertain behavior of skin: Secondary | ICD-10-CM | POA: Diagnosis not present

## 2022-06-13 DIAGNOSIS — Z85828 Personal history of other malignant neoplasm of skin: Secondary | ICD-10-CM | POA: Diagnosis not present

## 2022-06-13 DIAGNOSIS — B079 Viral wart, unspecified: Secondary | ICD-10-CM | POA: Diagnosis not present

## 2022-06-13 DIAGNOSIS — L400 Psoriasis vulgaris: Secondary | ICD-10-CM | POA: Diagnosis not present

## 2022-06-21 DIAGNOSIS — E119 Type 2 diabetes mellitus without complications: Secondary | ICD-10-CM | POA: Diagnosis not present

## 2022-06-21 DIAGNOSIS — E7800 Pure hypercholesterolemia, unspecified: Secondary | ICD-10-CM | POA: Diagnosis not present

## 2022-06-21 DIAGNOSIS — R945 Abnormal results of liver function studies: Secondary | ICD-10-CM | POA: Diagnosis not present

## 2022-06-21 DIAGNOSIS — E039 Hypothyroidism, unspecified: Secondary | ICD-10-CM | POA: Diagnosis not present

## 2022-06-21 LAB — HEMOGLOBIN A1C: Hemoglobin A1C: 7.3

## 2022-06-28 DIAGNOSIS — E119 Type 2 diabetes mellitus without complications: Secondary | ICD-10-CM | POA: Diagnosis not present

## 2022-06-28 DIAGNOSIS — Z23 Encounter for immunization: Secondary | ICD-10-CM | POA: Diagnosis not present

## 2022-06-28 DIAGNOSIS — E039 Hypothyroidism, unspecified: Secondary | ICD-10-CM | POA: Diagnosis not present

## 2022-06-28 DIAGNOSIS — R945 Abnormal results of liver function studies: Secondary | ICD-10-CM | POA: Diagnosis not present

## 2022-06-28 DIAGNOSIS — I10 Essential (primary) hypertension: Secondary | ICD-10-CM | POA: Diagnosis not present

## 2022-06-29 ENCOUNTER — Ambulatory Visit: Payer: BC Managed Care – PPO | Admitting: Physician Assistant

## 2022-06-29 ENCOUNTER — Encounter: Payer: Self-pay | Admitting: Physician Assistant

## 2022-06-29 VITALS — BP 105/67 | HR 76 | Temp 97.3°F | Ht 67.0 in | Wt 181.0 lb

## 2022-06-29 DIAGNOSIS — F4320 Adjustment disorder, unspecified: Secondary | ICD-10-CM

## 2022-06-29 DIAGNOSIS — E119 Type 2 diabetes mellitus without complications: Secondary | ICD-10-CM

## 2022-06-29 DIAGNOSIS — I152 Hypertension secondary to endocrine disorders: Secondary | ICD-10-CM

## 2022-06-29 DIAGNOSIS — E785 Hyperlipidemia, unspecified: Secondary | ICD-10-CM

## 2022-06-29 DIAGNOSIS — E079 Disorder of thyroid, unspecified: Secondary | ICD-10-CM

## 2022-06-29 DIAGNOSIS — E1169 Type 2 diabetes mellitus with other specified complication: Secondary | ICD-10-CM | POA: Diagnosis not present

## 2022-06-29 DIAGNOSIS — E1159 Type 2 diabetes mellitus with other circulatory complications: Secondary | ICD-10-CM | POA: Diagnosis not present

## 2022-06-29 MED ORDER — FLUOXETINE HCL 40 MG PO CAPS: 40.0000 mg | ORAL_CAPSULE | Freq: Every day | ORAL | 1 refills | Status: DC

## 2022-06-29 NOTE — Assessment & Plan Note (Signed)
-  Controlled. Continue Lisinopril 20 mg daily and HCTZ 12.5 mg daily. Will request most recent labs with endocrinology.

## 2022-06-29 NOTE — Assessment & Plan Note (Signed)
-  Last lipid panel normal with LDL 87, LDL goal<55. Patient will have labs repeated with cardiology. Recommend to continue rosuvastatin 20 mg daily.

## 2022-06-29 NOTE — Assessment & Plan Note (Signed)
Stable.  Continue Prozac 40 mg daily. 

## 2022-06-29 NOTE — Assessment & Plan Note (Signed)
-  Followed by endocrinology, Dr. Chalmers Cater. Will request most recent labs and note. Patient with a hx of pancreatitis so oral medications are limited and has tried and failed long-acting insulin.

## 2022-06-29 NOTE — Assessment & Plan Note (Signed)
-  Managed by endocrinology. Will request most recent labs. On levothyroxine 88 mcg daily.

## 2022-06-29 NOTE — Progress Notes (Signed)
Established patient visit   Patient: Jessica Chang   DOB: 28-Dec-1970   51 y.o. Female  MRN: [id number] Visit Date: 06/29/2022  Chief Complaint  Patient presents with   Follow-up   Subjective    HPI  Patient presents for chronic follow-up.   Diabetes mellitus: No increased urination or thirst. Pt reports will be placed on insulin pump tomorrow. Patient reports sugars continue to fluctuate, recent A1c was 7.2. Patient continues to closely monitor carbohydrate intake.   HTN: No chest pain, palpitations, dizziness, syncope or lower extremity swelling. Taking medication as directed without side effects.   HLD: Pt reports tolerating rosuvastatin 20 mg. No worsening joint pain from baseline.   Thyroid: Followed by endocrinology. Remains on levothyroxine 88 mcg.  Mood: Reports medication compliance with Prozac 40 mg daily. No changes with mood which has been stable.      06/29/2022    3:17 PM 06/30/2021    3:21 PM 12/29/2020    3:09 PM 06/11/2020    3:25 PM 12/11/2019    3:36 PM  Depression screen PHQ 2/9  Decreased Interest 0 0 0 0 0  Down, Depressed, Hopeless 0 0 0 0 0  PHQ - 2 Score 0 0 0 0 0  Altered sleeping 0 1 0 1 0  Tired, decreased energy 1 1 0 1 0  Change in appetite 0 0 0 0 0  Feeling bad or failure about yourself  0 1 0 0 0  Trouble concentrating 0 0 0 0 0  Moving slowly or fidgety/restless 0 0 0 0 0  Suicidal thoughts 0 0 0 0 0  PHQ-9 Score 1 3 0 2 0  Difficult doing work/chores Not difficult at all Not difficult at all  Not difficult at all       06/29/2022    3:17 PM 06/30/2021    3:21 PM 04/24/2019    2:57 PM  GAD 7 : Generalized Anxiety Score  Nervous, Anxious, on Edge 1 1 0  Control/stop worrying 0 0 0  Worry too much - different things 0 0 0  Trouble relaxing 0 0 0  Restless 0 0 0  Easily annoyed or irritable 0 0 0  Afraid - awful might happen 0 0 0  Total GAD 7 Score 1 1 0  Anxiety Difficulty Not difficult at all Not difficult at all Not difficult  at all      Medications: Outpatient Medications Prior to Visit  Medication Sig   aspirin EC 81 MG tablet Take 1 tablet (81 mg total) by mouth daily. Swallow whole.   augmented betamethasone dipropionate (DIPROLENE-AF) 0.05 % cream Apply topically as needed. scoroisis   cetirizine (ZYRTEC) 10 MG tablet Take 10 mg by mouth daily.   clobetasol (TEMOVATE) 0.05 % external solution Apply 1 Application topically as needed.   Continuous Blood Gluc Sensor (DEXCOM G7 SENSOR) MISC Inject into the skin once a week.   EPINEPHrine 0.3 mg/0.3 mL IJ SOAJ injection Inject 0.3 mLs (0.3 mg total) into the muscle as needed for anaphylaxis.   glimepiride (AMARYL) 2 MG tablet Take 2 mg by mouth daily.   glucose blood test strip Check 2 hour postprandial and fasting blood sugars daily; also if you feel poorly   hydrochlorothiazide (MICROZIDE) 12.5 MG capsule Take 1 capsule (12.5 mg total) by mouth daily. CALL AND SCHEDULE FOLLOW UP OFFICE VISIT TO RECEIVE FURTHER REFILLS. [phone]. 2ND ATTEMPT.   hydrocortisone 2.5 % cream Apply 1 application topically daily as needed (skin irritation).  Ibuprofen 200 MG CAPS Take 600 mg by mouth daily as needed (pain).    Insulin NPH, Human,, Isophane, (HUMULIN N KWIKPEN) 100 UNIT/ML Kiwkpen 10u [place] BID   JARDIANCE 25 MG TABS tablet Take 25 mg by mouth daily.   levonorgestrel (MIRENA) 20 MCG/24HR IUD 1 each by Intrauterine route continuous.    levothyroxine (SYNTHROID) 88 MCG tablet Take 88 mcg by mouth daily before breakfast.    lisinopril (PRINIVIL,ZESTRIL) 20 MG tablet Take 20 mg by mouth daily.   rosuvastatin (CRESTOR) 20 MG tablet Take 1 tablet (20 mg total) by mouth daily.   valACYclovir (VALTREX) 500 MG tablet Take 500 mg by mouth as needed. breakouts   [DISCONTINUED] FLUoxetine (PROZAC) 40 MG capsule Take 1 capsule (40 mg total) by mouth daily.   fluticasone (FLONASE) 50 MCG/ACT nasal spray Place 1 spray into both nostrils daily for 3 days.   [DISCONTINUED]  amoxicillin-clavulanate (AUGMENTIN) 875-125 MG tablet Take 1 tablet by mouth every 12 (twelve) hours. (Patient not taking: Reported on 06/29/2022)   [DISCONTINUED] benzonatate (TESSALON) 100 MG capsule Take 1 capsule (100 mg total) by mouth every 8 (eight) hours as needed for cough. (Patient not taking: Reported on 06/29/2022)   No facility-administered medications prior to visit.    Review of Systems Review of Systems:  A fourteen system review of systems was performed and found to be positive as per HPI.  Last CBC Lab Results  Component Value Date   WBC 7.1 08/18/2020   HGB 12.5 08/18/2020   HCT 37.1 08/18/2020   MCV 89.8 08/18/2020   MCH 30.3 08/18/2020   RDW 12.1 08/18/2020   PLT 279 97/67/3419   Last metabolic panel Lab Results  Component Value Date   GLUCOSE 115 (H) 09/08/2020   NA 140 12/08/2021   NA 140 12/08/2021   K 4.9 12/08/2021   K 4.7 12/08/2021   CL 100 12/08/2021   CL 100 12/08/2021   CO2 28 12/08/2021   CO2 29 12/08/2021   BUN 19 12/08/2021   BUN 22 12/08/2021   CREATININE 0.8 12/08/2021   CREATININE 0.84 12/08/2021   GFRNONAA 86 12/08/2021   GFRNONAA 81 12/08/2021   CALCIUM 10.2 12/08/2021   CALCIUM 10.4 12/08/2021   PHOS 4.2 05/20/2016   PROT 7.7 12/08/2021   ALBUMIN 5.2 12/08/2021   LABGLOB 2.5 06/16/2017   AGRATIO 2.0 06/16/2017   BILITOT 0.6 08/18/2020   ALKPHOS 81 12/08/2021   AST 48 12/08/2021   ALT 108 (A) 12/08/2021   ANIONGAP 10 08/18/2020   Last lipids Lab Results  Component Value Date   CHOL 174 12/08/2021   HDL 43 12/08/2021   LDLCALC 116 10/11/2018   TRIG 221 (A) 12/08/2021   CHOLHDL 4.05 12/08/2021   Last hemoglobin A1c Lab Results  Component Value Date   HGBA1C 7.8 12/08/2021   Last thyroid functions Lab Results  Component Value Date   TSH 0.32 12/08/2021       Objective    BP 105/67   Pulse 76   Temp (!) 97.3 F (36.3 C) (Temporal)   Ht '5\' 7"'$  (1.702 m)   Wt 181 lb (82.1 kg)   SpO2 99%   BMI 28.35 kg/m   BP Readings from Last 3 Encounters:  06/29/22 105/67  04/22/22 115/78  04/20/22 120/80   Wt Readings from Last 3 Encounters:  06/29/22 181 lb (82.1 kg)  04/22/22 183 lb (83 kg)  12/30/21 181 lb 14.4 oz (82.5 kg)    Physical Exam  General:  Well Developed, well nourished, appropriate for stated age.  Neuro:  Alert and oriented,  extra-ocular muscles intact  HEENT:  Normocephalic, atraumatic, neck supple  Skin:  no gross rash, warm, pink. Cardiac:  RRR, S1 S2 Respiratory: CTA B/L  Vascular:  Ext warm, no cyanosis apprec.; cap RF less 2 sec. Psych:  No HI/SI, judgement and insight good, Euthymic mood. Full Affect.   No results found for any visits on 06/29/22.  Assessment & Plan      Problem List Items Addressed This Visit       Cardiovascular and Mediastinum   Hypertension associated with diabetes ([place]) (Chronic)    -Controlled. Continue Lisinopril 20 mg daily and HCTZ 12.5 mg daily. Will request most recent labs with endocrinology.        Endocrine   Diabetes ([place]) - Primary (Chronic)    -Followed by endocrinology, Dr. Chalmers Cater. Will request most recent labs and note. Patient with a hx of pancreatitis so oral medications are limited and has tried and failed long-acting insulin.      Thyroid disease- management per endocrinology Dr. Soyla Murphy (Chronic)    -Managed by endocrinology. Will request most recent labs. On levothyroxine 88 mcg daily.       Hyperlipidemia associated with type 2 diabetes mellitus ([place])    -Last lipid panel normal with LDL 87, LDL goal<55. Patient will have labs repeated with cardiology. Recommend to continue rosuvastatin 20 mg daily.         Other   Adjustment disorder    -Stable. Continue Prozac 40 mg daily.       Relevant Medications   FLUoxetine (PROZAC) 40 MG capsule    Return in about 6 months (around 12/29/2022) for DM, HTN, HLD.        Lorrene Reid, PA-C  [facility] at [facility] [phone]  (phone) [phone] (fax)  [place]

## 2022-06-29 NOTE — Assessment & Plan Note (Signed)
>>  ASSESSMENT AND PLAN FOR DIABETES (HCC) WRITTEN ON 06/29/2022  3:50 PM BY ABONZA, MARITZA, PA-C  -Followed by endocrinology, Dr. Talmage Nap. Will request most recent labs and note. Patient with a hx of pancreatitis so oral medications are limited and has tried and failed long-acting insulin.

## 2022-07-12 DIAGNOSIS — M542 Cervicalgia: Secondary | ICD-10-CM | POA: Diagnosis not present

## 2022-07-20 ENCOUNTER — Encounter: Payer: Self-pay | Admitting: Physician Assistant

## 2022-07-25 ENCOUNTER — Telehealth: Payer: Self-pay

## 2022-07-25 NOTE — Patient Outreach (Signed)
  Care Coordination   07/25/2022 Name: Jessica Chang MRN: [id number] DOB: [birth year]   Care Coordination Outreach Attempts:  An unsuccessful telephone outreach was attempted today to offer the patient information about available care coordination services as a benefit of their health plan.   Follow Up Plan:  Additional outreach attempts will be made to offer the patient care coordination information and services.   Encounter Outcome:  No Answer  Care Coordination Interventions Activated:  No   Care Coordination Interventions:  No, not indicated    Jone Baseman, RN, MSN [facility] Care Management Care Management Coordinator Direct Line [phone]

## 2022-08-01 ENCOUNTER — Ambulatory Visit: Payer: BC Managed Care – PPO | Attending: Internal Medicine

## 2022-08-01 DIAGNOSIS — E785 Hyperlipidemia, unspecified: Secondary | ICD-10-CM | POA: Diagnosis not present

## 2022-08-01 DIAGNOSIS — I2510 Atherosclerotic heart disease of native coronary artery without angina pectoris: Secondary | ICD-10-CM

## 2022-08-01 DIAGNOSIS — E1169 Type 2 diabetes mellitus with other specified complication: Secondary | ICD-10-CM

## 2022-08-01 LAB — LIPID PANEL
Chol/HDL Ratio: 3.5 ratio (ref 0.0–4.4)
Cholesterol, Total: 180 mg/dL (ref 100–199)
HDL: 52 mg/dL (ref >39)
LDL Chol Calc (NIH): 106 mg/dL — ABNORMAL HIGH (ref 0–99)
Triglycerides: 122 mg/dL (ref 0–149)
VLDL Cholesterol Cal: 22 mg/dL (ref 5–40)

## 2022-08-01 LAB — ALT: ALT: 76 IU/L — ABNORMAL HIGH (ref 0–32)

## 2022-08-02 DIAGNOSIS — Z01419 Encounter for gynecological examination (general) (routine) without abnormal findings: Secondary | ICD-10-CM | POA: Diagnosis not present

## 2022-08-02 DIAGNOSIS — Z1231 Encounter for screening mammogram for malignant neoplasm of breast: Secondary | ICD-10-CM | POA: Diagnosis not present

## 2022-08-02 DIAGNOSIS — Z6827 Body mass index (BMI) 27.0-27.9, adult: Secondary | ICD-10-CM | POA: Diagnosis not present

## 2022-08-02 DIAGNOSIS — N912 Amenorrhea, unspecified: Secondary | ICD-10-CM | POA: Diagnosis not present

## 2022-08-02 LAB — HM MAMMOGRAPHY

## 2022-08-18 ENCOUNTER — Telehealth: Payer: Self-pay

## 2022-08-18 NOTE — Patient Outreach (Signed)
  Care Coordination   08/18/2022 Name: Raymie Trani MRN: [id number] DOB: 19-Jun-1971   Care Coordination Outreach Attempts:  A second unsuccessful outreach was attempted today to offer the patient with information about available care coordination services as a benefit of their health plan.     Follow Up Plan:  Additional outreach attempts will be made to offer the patient care coordination information and services.   Encounter Outcome:  No Answer   Care Coordination Interventions:  No, not indicated    Jone Baseman, RN, MSN [place] [facility] Care Management Coordinator Direct Line [phone]

## 2022-09-02 ENCOUNTER — Telehealth: Payer: Self-pay

## 2022-09-02 NOTE — Patient Outreach (Signed)
  Care Coordination   09/02/2022 Name: Jessica Chang MRN: [id number] DOB: 18-Aug-1971   Care Coordination Outreach Attempts:  A third unsuccessful outreach was attempted today to offer the patient with information about available care coordination services as a benefit of their health plan.   Follow Up Plan:  No further outreach attempts will be made at this time. We have been unable to contact the patient to offer or enroll patient in care coordination services  Encounter Outcome:  No Answer   Care Coordination Interventions:  No, not indicated    Jone Baseman, RN, MSN [place] [facility] Care Management Coordinator Direct Line [phone]

## 2022-10-26 DIAGNOSIS — E039 Hypothyroidism, unspecified: Secondary | ICD-10-CM | POA: Diagnosis not present

## 2022-10-26 DIAGNOSIS — I10 Essential (primary) hypertension: Secondary | ICD-10-CM | POA: Diagnosis not present

## 2022-10-26 DIAGNOSIS — E1165 Type 2 diabetes mellitus with hyperglycemia: Secondary | ICD-10-CM | POA: Diagnosis not present

## 2022-10-26 DIAGNOSIS — Z13220 Encounter for screening for lipoid disorders: Secondary | ICD-10-CM | POA: Diagnosis not present

## 2022-10-26 DIAGNOSIS — Z130 Encounter for screening for diseases of the blood and blood-forming organs and certain disorders involving the immune mechanism: Secondary | ICD-10-CM | POA: Diagnosis not present

## 2022-10-26 DIAGNOSIS — N951 Menopausal and female climacteric states: Secondary | ICD-10-CM | POA: Diagnosis not present

## 2022-10-26 DIAGNOSIS — Z6828 Body mass index (BMI) 28.0-28.9, adult: Secondary | ICD-10-CM | POA: Diagnosis not present

## 2022-11-01 DIAGNOSIS — Z1331 Encounter for screening for depression: Secondary | ICD-10-CM | POA: Diagnosis not present

## 2022-11-01 DIAGNOSIS — Z1339 Encounter for screening examination for other mental health and behavioral disorders: Secondary | ICD-10-CM | POA: Diagnosis not present

## 2022-11-01 DIAGNOSIS — R635 Abnormal weight gain: Secondary | ICD-10-CM | POA: Diagnosis not present

## 2022-11-01 DIAGNOSIS — N951 Menopausal and female climacteric states: Secondary | ICD-10-CM | POA: Diagnosis not present

## 2022-11-01 DIAGNOSIS — I10 Essential (primary) hypertension: Secondary | ICD-10-CM | POA: Diagnosis not present

## 2022-11-01 DIAGNOSIS — E039 Hypothyroidism, unspecified: Secondary | ICD-10-CM | POA: Diagnosis not present

## 2022-11-08 DIAGNOSIS — E1165 Type 2 diabetes mellitus with hyperglycemia: Secondary | ICD-10-CM | POA: Diagnosis not present

## 2022-11-08 DIAGNOSIS — Z6827 Body mass index (BMI) 27.0-27.9, adult: Secondary | ICD-10-CM | POA: Diagnosis not present

## 2022-11-15 DIAGNOSIS — E1165 Type 2 diabetes mellitus with hyperglycemia: Secondary | ICD-10-CM | POA: Diagnosis not present

## 2022-11-15 DIAGNOSIS — I10 Essential (primary) hypertension: Secondary | ICD-10-CM | POA: Diagnosis not present

## 2022-11-15 DIAGNOSIS — Z6827 Body mass index (BMI) 27.0-27.9, adult: Secondary | ICD-10-CM | POA: Diagnosis not present

## 2022-11-15 DIAGNOSIS — E039 Hypothyroidism, unspecified: Secondary | ICD-10-CM | POA: Diagnosis not present

## 2022-11-22 DIAGNOSIS — Z6827 Body mass index (BMI) 27.0-27.9, adult: Secondary | ICD-10-CM | POA: Diagnosis not present

## 2022-11-22 DIAGNOSIS — E039 Hypothyroidism, unspecified: Secondary | ICD-10-CM | POA: Diagnosis not present

## 2022-11-22 DIAGNOSIS — E1165 Type 2 diabetes mellitus with hyperglycemia: Secondary | ICD-10-CM | POA: Diagnosis not present

## 2022-12-13 DIAGNOSIS — M5412 Radiculopathy, cervical region: Secondary | ICD-10-CM | POA: Diagnosis not present

## 2022-12-27 DIAGNOSIS — M5412 Radiculopathy, cervical region: Secondary | ICD-10-CM | POA: Diagnosis not present

## 2022-12-28 DIAGNOSIS — E039 Hypothyroidism, unspecified: Secondary | ICD-10-CM | POA: Diagnosis not present

## 2022-12-28 DIAGNOSIS — E119 Type 2 diabetes mellitus without complications: Secondary | ICD-10-CM | POA: Diagnosis not present

## 2022-12-28 DIAGNOSIS — E7800 Pure hypercholesterolemia, unspecified: Secondary | ICD-10-CM | POA: Diagnosis not present

## 2022-12-28 LAB — COMPREHENSIVE METABOLIC PANEL
Albumin: 4.8 (ref 3.5–5.0)
Calcium: 9.9 (ref 8.7–10.7)
Globulin: 2.1
eGFR: 106

## 2022-12-28 LAB — BASIC METABOLIC PANEL
BUN: 25 — AB (ref 4–21)
CO2: 22 (ref 13–22)
Chloride: 103 (ref 99–108)
Creatinine: 0.7 (ref 0.5–1.1)
Glucose: 150
Potassium: 4.8 meq/L (ref 3.5–5.1)
Sodium: 140 (ref 137–147)

## 2022-12-28 LAB — LIPID PANEL
Cholesterol: 176 (ref 0–200)
HDL: 43 (ref 35–70)
LDL Cholesterol: 100
Triglycerides: 194 — AB (ref 40–160)

## 2022-12-28 LAB — HEMOGLOBIN A1C: Hemoglobin A1C: 7.1

## 2022-12-28 LAB — HEPATIC FUNCTION PANEL
ALT: 52 U/L — AB (ref 7–35)
AST: 32 (ref 13–35)
Alkaline Phosphatase: 77 (ref 25–125)
Bilirubin, Total: 0.3

## 2022-12-28 LAB — TSH: TSH: 1.66 (ref 0.41–5.90)

## 2022-12-29 ENCOUNTER — Encounter: Payer: Self-pay | Admitting: Nurse Practitioner

## 2022-12-29 ENCOUNTER — Ambulatory Visit: Payer: BC Managed Care – PPO | Admitting: Nurse Practitioner

## 2022-12-29 VITALS — BP 128/79 | HR 64 | Ht 67.0 in | Wt 183.0 lb

## 2022-12-29 DIAGNOSIS — Z794 Long term (current) use of insulin: Secondary | ICD-10-CM

## 2022-12-29 DIAGNOSIS — R945 Abnormal results of liver function studies: Secondary | ICD-10-CM | POA: Diagnosis not present

## 2022-12-29 DIAGNOSIS — Z23 Encounter for immunization: Secondary | ICD-10-CM

## 2022-12-29 DIAGNOSIS — I10 Essential (primary) hypertension: Secondary | ICD-10-CM | POA: Diagnosis not present

## 2022-12-29 DIAGNOSIS — E785 Hyperlipidemia, unspecified: Secondary | ICD-10-CM

## 2022-12-29 DIAGNOSIS — E119 Type 2 diabetes mellitus without complications: Secondary | ICD-10-CM | POA: Diagnosis not present

## 2022-12-29 DIAGNOSIS — E1159 Type 2 diabetes mellitus with other circulatory complications: Secondary | ICD-10-CM | POA: Diagnosis not present

## 2022-12-29 DIAGNOSIS — E1169 Type 2 diabetes mellitus with other specified complication: Secondary | ICD-10-CM | POA: Diagnosis not present

## 2022-12-29 DIAGNOSIS — F4320 Adjustment disorder, unspecified: Secondary | ICD-10-CM

## 2022-12-29 DIAGNOSIS — I152 Hypertension secondary to endocrine disorders: Secondary | ICD-10-CM | POA: Diagnosis not present

## 2022-12-29 DIAGNOSIS — E039 Hypothyroidism, unspecified: Secondary | ICD-10-CM | POA: Diagnosis not present

## 2022-12-29 MED ORDER — FLUOXETINE HCL 40 MG PO CAPS: 40.0000 mg | ORAL_CAPSULE | Freq: Every day | ORAL | 1 refills | Status: DC

## 2022-12-29 NOTE — Progress Notes (Signed)
Established patient visit   Patient: Jessica Chang   DOB: 11/21/1970   52 y.o. Female  MRN: [id number] Visit Date: 12/29/2022   Chief Complaint  Patient presents with   Medical Management of Chronic Issues   Subjective    HPI  Follow up  -GAD/depression -well managed on current dose prozac.  -diabetes managed per endocrinology -due for 2nd shingles vaccine.  -no current  concerns or complaints  -She denies chest pain, chest pressure, or shortness of breath. She denies headaches or visual disturbances. She denies abdominal pain, nausea, vomiting, or changes in bowel or bladder habits.     Medications: Outpatient Medications Prior to Visit  Medication Sig   aspirin EC 81 MG tablet Take 1 tablet (81 mg total) by mouth daily. Swallow whole.   augmented betamethasone dipropionate (DIPROLENE-AF) 0.05 % cream Apply topically as needed. scoroisis   cetirizine (ZYRTEC) 10 MG tablet Take 10 mg by mouth daily.   clobetasol (TEMOVATE) 0.05 % external solution Apply 1 Application topically as needed.   EPINEPHrine 0.3 mg/0.3 mL IJ SOAJ injection Inject 0.3 mLs (0.3 mg total) into the muscle as needed for anaphylaxis.   glucose blood test strip Check 2 hour postprandial and fasting blood sugars daily; also if you feel poorly   hydrochlorothiazide (MICROZIDE) 12.5 MG capsule Take 1 capsule (12.5 mg total) by mouth daily. CALL AND SCHEDULE FOLLOW UP OFFICE VISIT TO RECEIVE FURTHER REFILLS. [phone]. 2ND ATTEMPT.   hydrocortisone 2.5 % cream Apply 1 application topically daily as needed (skin irritation).    Ibuprofen 200 MG CAPS Take 600 mg by mouth daily as needed (pain).    JARDIANCE 25 MG TABS tablet Take 25 mg by mouth daily.   levonorgestrel (MIRENA) 20 MCG/24HR IUD 1 each by Intrauterine route continuous.    levothyroxine (SYNTHROID) 88 MCG tablet Take 75 mcg by mouth daily before breakfast.   lisinopril (PRINIVIL,ZESTRIL) 20 MG tablet Take 20 mg by mouth daily.   rosuvastatin  (CRESTOR) 20 MG tablet Take 1 tablet (20 mg total) by mouth daily.   valACYclovir (VALTREX) 500 MG tablet Take 500 mg by mouth as needed. breakouts   [DISCONTINUED] FLUoxetine (PROZAC) 40 MG capsule Take 1 capsule (40 mg total) by mouth daily.   fluticasone (FLONASE) 50 MCG/ACT nasal spray Place 1 spray into both nostrils daily for 3 days.   Insulin NPH, Human,, Isophane, (HUMULIN N KWIKPEN) 100 UNIT/ML Kiwkpen 10u [place] BID (Patient not taking: Reported on 12/29/2022)   [DISCONTINUED] Continuous Blood Gluc Sensor (DEXCOM G7 SENSOR) MISC Inject into the skin once a week.   [DISCONTINUED] glimepiride (AMARYL) 2 MG tablet Take 2 mg by mouth daily.   No facility-administered medications prior to visit.    Review of Systems See HPI    Last CBC Lab Results  Component Value Date   WBC 7.1 08/18/2020   HGB 12.5 08/18/2020   HCT 37.1 08/18/2020   MCV 89.8 08/18/2020   MCH 30.3 08/18/2020   RDW 12.1 08/18/2020   PLT 279 08/18/2020   Last metabolic panel Lab Results  Component Value Date   GLUCOSE 115 (H) 09/08/2020   NA 140 12/08/2021   NA 140 12/08/2021   K 4.9 12/08/2021   K 4.7 12/08/2021   CL 100 12/08/2021   CL 100 12/08/2021   CO2 28 12/08/2021   CO2 29 12/08/2021   BUN 19 12/08/2021   BUN 22 12/08/2021   CREATININE 0.8 12/08/2021   CREATININE 0.84 12/08/2021   GFRNONAA 86 12/08/2021  GFRNONAA 81 12/08/2021   CALCIUM 10.2 12/08/2021   CALCIUM 10.4 12/08/2021   PHOS 4.2 05/20/2016   PROT 7.7 12/08/2021   ALBUMIN 5.2 12/08/2021   LABGLOB 2.5 06/16/2017   AGRATIO 2.0 06/16/2017   BILITOT 0.6 08/18/2020   ALKPHOS 81 12/08/2021   AST 48 12/08/2021   ALT 76 (H) 08/01/2022   ANIONGAP 10 08/18/2020   Last lipids Lab Results  Component Value Date   CHOL 180 08/01/2022   HDL 52 08/01/2022   LDLCALC 106 (H) 08/01/2022   TRIG 122 08/01/2022   CHOLHDL 3.5 08/01/2022   Last hemoglobin A1c Lab Results  Component Value Date   HGBA1C 7.3 06/21/2022   Last thyroid  functions Lab Results  Component Value Date   TSH 0.32 12/08/2021   Last vitamin D Lab Results  Component Value Date   VD25OH 49.2 12/08/2021       Objective     Today's Vitals   12/29/22 1527  BP: 128/79  Pulse: 64  SpO2: 98%  Weight: 183 lb (83 kg)  Height: 5\' 7"  (1.702 m)   Body mass index is 28.66 kg/m.  BP Readings from Last 3 Encounters:  12/29/22 128/79  06/29/22 105/67  04/22/22 115/78    Wt Readings from Last 3 Encounters:  12/29/22 183 lb (83 kg)  06/29/22 181 lb (82.1 kg)  04/22/22 183 lb (83 kg)    Physical Exam Vitals and nursing note reviewed.  Constitutional:      Appearance: Normal appearance. She is well-developed.  HENT:     Head: Normocephalic and atraumatic.     Nose: Nose normal.     Mouth/Throat:     Mouth: Mucous membranes are moist.     Pharynx: Oropharynx is clear.  Eyes:     Extraocular Movements: Extraocular movements intact.     Conjunctiva/sclera: Conjunctivae normal.     Pupils: Pupils are equal, round, and reactive to light.  Neck:     Vascular: No carotid bruit.  Cardiovascular:     Rate and Rhythm: Normal rate and regular rhythm.     Pulses: Normal pulses.     Heart sounds: Normal heart sounds.  Pulmonary:     Effort: Pulmonary effort is normal.     Breath sounds: Normal breath sounds.  Abdominal:     Palpations: Abdomen is soft.  Musculoskeletal:        General: Normal range of motion.     Cervical back: Normal range of motion and neck supple.  Lymphadenopathy:     Cervical: No cervical adenopathy.  Skin:    General: Skin is warm and dry.     Capillary Refill: Capillary refill takes less than 2 seconds.  Neurological:     General: No focal deficit present.     Mental Status: She is alert and oriented to person, place, and time.  Psychiatric:        Mood and Affect: Mood normal.        Behavior: Behavior normal.        Thought Content: Thought content normal.        Judgment: Judgment normal.       Assessment & Plan    Adjustment disorder, unspecified type Assessment & Plan: -Stable. Continue Prozac 40 mg daily.   Orders: -     FLUoxetine HCl; Take 1 capsule (40 mg total) by mouth daily.  Dispense: 90 capsule; Refill: 1  Need for shingles vaccine -     Varicella-zoster vaccine IM  Hypertension  associated with diabetes (HCC) Assessment & Plan: BP stable. Continue current medication.    Hyperlipidemia associated with type 2 diabetes mellitus (HCC) Assessment & Plan: Stable. Continue crestor 20 mg daily    Type 2 diabetes mellitus without complication, with long-term current use of insulin (HCC) Assessment & Plan: Managed per endocrinology       Return in about 6 months (around 06/30/2023) for mood.         Carlean Jews, NP  [facility] at [facility] [phone] (phone) [phone] (fax)  [facility]

## 2022-12-30 ENCOUNTER — Encounter: Payer: Self-pay | Admitting: Family Medicine

## 2023-01-04 DIAGNOSIS — M79602 Pain in left arm: Secondary | ICD-10-CM | POA: Diagnosis not present

## 2023-01-04 DIAGNOSIS — M542 Cervicalgia: Secondary | ICD-10-CM | POA: Diagnosis not present

## 2023-01-11 DIAGNOSIS — R202 Paresthesia of skin: Secondary | ICD-10-CM | POA: Diagnosis not present

## 2023-01-11 DIAGNOSIS — M542 Cervicalgia: Secondary | ICD-10-CM | POA: Diagnosis not present

## 2023-01-11 DIAGNOSIS — M79602 Pain in left arm: Secondary | ICD-10-CM | POA: Diagnosis not present

## 2023-01-19 LAB — HM DIABETES EYE EXAM

## 2023-01-20 DIAGNOSIS — M79602 Pain in left arm: Secondary | ICD-10-CM | POA: Diagnosis not present

## 2023-01-20 DIAGNOSIS — R202 Paresthesia of skin: Secondary | ICD-10-CM | POA: Diagnosis not present

## 2023-01-20 DIAGNOSIS — M542 Cervicalgia: Secondary | ICD-10-CM | POA: Diagnosis not present

## 2023-01-24 DIAGNOSIS — M5412 Radiculopathy, cervical region: Secondary | ICD-10-CM | POA: Diagnosis not present

## 2023-01-24 DIAGNOSIS — Z6828 Body mass index (BMI) 28.0-28.9, adult: Secondary | ICD-10-CM | POA: Diagnosis not present

## 2023-01-31 NOTE — Assessment & Plan Note (Signed)
BP stable.  Continue current medication. 

## 2023-01-31 NOTE — Assessment & Plan Note (Signed)
Stable.  Continue Prozac 40 mg daily. 

## 2023-01-31 NOTE — Assessment & Plan Note (Signed)
Stable. Continue crestor 20 mg daily. 

## 2023-01-31 NOTE — Assessment & Plan Note (Signed)
Managed per endocrinology

## 2023-03-08 DIAGNOSIS — E039 Hypothyroidism, unspecified: Secondary | ICD-10-CM | POA: Diagnosis not present

## 2023-03-16 DIAGNOSIS — M25511 Pain in right shoulder: Secondary | ICD-10-CM | POA: Diagnosis not present

## 2023-03-16 DIAGNOSIS — M542 Cervicalgia: Secondary | ICD-10-CM | POA: Diagnosis not present

## 2023-04-10 DIAGNOSIS — M542 Cervicalgia: Secondary | ICD-10-CM | POA: Diagnosis not present

## 2023-04-10 DIAGNOSIS — M25511 Pain in right shoulder: Secondary | ICD-10-CM | POA: Diagnosis not present

## 2023-04-17 DIAGNOSIS — M25511 Pain in right shoulder: Secondary | ICD-10-CM | POA: Diagnosis not present

## 2023-04-17 DIAGNOSIS — M542 Cervicalgia: Secondary | ICD-10-CM | POA: Diagnosis not present

## 2023-04-20 ENCOUNTER — Other Ambulatory Visit: Payer: Self-pay | Admitting: Internal Medicine

## 2023-04-20 DIAGNOSIS — R079 Chest pain, unspecified: Secondary | ICD-10-CM

## 2023-04-20 DIAGNOSIS — Z8249 Family history of ischemic heart disease and other diseases of the circulatory system: Secondary | ICD-10-CM

## 2023-04-20 DIAGNOSIS — E785 Hyperlipidemia, unspecified: Secondary | ICD-10-CM

## 2023-04-20 DIAGNOSIS — R072 Precordial pain: Secondary | ICD-10-CM

## 2023-04-20 DIAGNOSIS — E1159 Type 2 diabetes mellitus with other circulatory complications: Secondary | ICD-10-CM

## 2023-04-20 DIAGNOSIS — R0609 Other forms of dyspnea: Secondary | ICD-10-CM

## 2023-04-26 DIAGNOSIS — M25511 Pain in right shoulder: Secondary | ICD-10-CM | POA: Diagnosis not present

## 2023-05-01 DIAGNOSIS — D225 Melanocytic nevi of trunk: Secondary | ICD-10-CM | POA: Diagnosis not present

## 2023-05-01 DIAGNOSIS — C44622 Squamous cell carcinoma of skin of right upper limb, including shoulder: Secondary | ICD-10-CM | POA: Diagnosis not present

## 2023-05-01 DIAGNOSIS — Z85828 Personal history of other malignant neoplasm of skin: Secondary | ICD-10-CM | POA: Diagnosis not present

## 2023-05-01 DIAGNOSIS — L814 Other melanin hyperpigmentation: Secondary | ICD-10-CM | POA: Diagnosis not present

## 2023-05-01 DIAGNOSIS — L570 Actinic keratosis: Secondary | ICD-10-CM | POA: Diagnosis not present

## 2023-05-01 DIAGNOSIS — D485 Neoplasm of uncertain behavior of skin: Secondary | ICD-10-CM | POA: Diagnosis not present

## 2023-05-15 ENCOUNTER — Other Ambulatory Visit: Payer: Self-pay | Admitting: Internal Medicine

## 2023-05-15 DIAGNOSIS — R079 Chest pain, unspecified: Secondary | ICD-10-CM

## 2023-05-15 DIAGNOSIS — R072 Precordial pain: Secondary | ICD-10-CM

## 2023-05-15 DIAGNOSIS — R0609 Other forms of dyspnea: Secondary | ICD-10-CM

## 2023-05-15 DIAGNOSIS — Z8249 Family history of ischemic heart disease and other diseases of the circulatory system: Secondary | ICD-10-CM

## 2023-05-15 DIAGNOSIS — E1159 Type 2 diabetes mellitus with other circulatory complications: Secondary | ICD-10-CM

## 2023-05-15 DIAGNOSIS — E785 Hyperlipidemia, unspecified: Secondary | ICD-10-CM

## 2023-06-20 NOTE — Progress Notes (Unsigned)
Cardiology Office Note    Date:  06/21/2023  ID:  Jessica Chang, DOB 07-25-1971, MRN [id number] PCP:  Melida Quitter, PA  Cardiologist:  Christell Constant, MD  Electrophysiologist:  None   Chief Complaint: Follow up for nonobstructive CAD  History of Present Illness: Jessica Chang is a 52 y.o. female with visit-pertinent history of nonobstructive CAD by cardiac CT in 2023, diabetes mellitus, hypertension, mild dilation of aorta/aortic root, pancreatitis, arthritis, thyroid disease, prior elevated LFTs in setting of TB-exposure treatment.  First seen by Dr. Izora Ribas in 08/2020 for chest discomfort and shortness of breath.  He underwent coronary CTA which showed a CAC of 971, placing her in the 99th percentile for age and gender, noted to have extensive, age advanced coronary plaque that is not hemodynamically significant based on FFR.  Echocardiogram on 04/02/2021 dictated LVEF of 60 to 65%, LV with normal function and no RWMA, no significant valvular normalities, aortic dilation was noted at 39 mm.  Repeat echo for monitoring of aorta in 01/2022 showed LVEF 55 to 60% G1 DD, mitral valve regurgitation, mild AR, AV sclerosis without evidence of stenosis.  Borderline dilation of the aortic root measuring 37 mm and borderline dilation of ascending aorta measuring 39 mm.  Today she presents for follow-up, she reports she has been doing very well.  She does report 2 episodes of nocturnal chest discomfort 6 weeks ago.  Reports that she awoke with mid chest pain that resolved after an hour.  On review she has had this previously, she is unsure if this is related to acid reflux. This was described similarly in prior visit. She denies any chest pain on exertion, shortness of breath, lower extremity edema.  She also endorses a sensation of feeling her normal heartbeat when at rest and not distracted, reports this has been ongoing for years and is not bothersome to her.  Denies feelings of  palpitations, fast heartbeats or fluttering sensation.  Labwork independently reviewed: 12/28/22: TSH 0.175, hemoglobin A1c 7.1,  Creatinine 0.66, potassium 4.8, sodium 140, AST 32, ALT 52 Total cholesterol 176, triglycerides 194, HDL 43, LDL 100 03/08/23: TSH 1.66  ROS: .   Today she denies shortness of breath, lower extremity edema, palpitations, melena, hematuria, hemoptysis, diaphoresis, weakness, presyncope, syncope, orthopnea, and PND.  All other systems are reviewed and otherwise negative. Studies Reviewed: Marland Kitchen   EKG:  EKG is ordered today, personally reviewed, demonstrating  EKG Interpretation Date/Time:  Wednesday June 21 2023 13:40:01 EDT Ventricular Rate:  74 PR Interval:  178 QRS Duration:  80 QT Interval:  408 QTC Calculation: 452 R Axis:   15  Text Interpretation: Normal sinus rhythm Low voltage QRS Confirmed by Reather Littler [phone]) on 06/21/2023 1:45:04 PM    CV Studies: Cardiac studies reviewed are outlined and summarized above. Otherwise please see EMR for full report. Current Reported Medications:.    Current Meds  Medication Sig   aspirin EC 81 MG tablet Take 1 tablet (81 mg total) by mouth daily. Swallow whole.   augmented betamethasone dipropionate (DIPROLENE-AF) 0.05 % cream Apply topically as needed. scoroisis   cetirizine (ZYRTEC) 10 MG tablet Take 10 mg by mouth daily.   clobetasol (TEMOVATE) 0.05 % external solution Apply 1 Application topically as needed.   EPINEPHrine 0.3 mg/0.3 mL IJ SOAJ injection Inject 0.3 mLs (0.3 mg total) into the muscle as needed for anaphylaxis.   FLUoxetine (PROZAC) 40 MG capsule Take 1 capsule (40 mg total) by mouth daily.  glucose blood test strip Check 2 hour postprandial and fasting blood sugars daily; also if you feel poorly   hydrochlorothiazide (MICROZIDE) 12.5 MG capsule TAKE 1 CAPSULE BY MOUTH EVERY DAY   hydrocortisone 2.5 % cream Apply 1 application topically daily as needed (skin irritation).    Ibuprofen 200 MG CAPS  Take 600 mg by mouth daily as needed (pain).    Insulin Human (INSULIN PUMP) SOLN Dexcom omnipod 5, 150 units every 3 days   JARDIANCE 25 MG TABS tablet Take 25 mg by mouth daily.   levonorgestrel (MIRENA) 20 MCG/24HR IUD 1 each by Intrauterine route continuous.    levothyroxine (SYNTHROID) 88 MCG tablet Take 75 mcg by mouth daily before breakfast.   lisinopril (PRINIVIL,ZESTRIL) 20 MG tablet Take 20 mg by mouth daily.   rosuvastatin (CRESTOR) 40 MG tablet Take 1 tablet (40 mg total) by mouth daily.   valACYclovir (VALTREX) 500 MG tablet Take 500 mg by mouth as needed. breakouts   [DISCONTINUED] rosuvastatin (CRESTOR) 20 MG tablet TAKE 1 TABLET BY MOUTH EVERY DAY   Physical Exam:    VS:  BP 116/76   Pulse 74   Ht 5\' 7"  (1.702 m)   Wt 183 lb 3.2 oz (83.1 kg)   SpO2 97%   BMI 28.69 kg/m    Wt Readings from Last 3 Encounters:  06/21/23 183 lb 3.2 oz (83.1 kg)  12/29/22 183 lb (83 kg)  06/29/22 181 lb (82.1 kg)    GEN: Well nourished, well developed in no acute distress NECK: No JVD; No carotid bruits CARDIAC: RRR, no murmurs, rubs, gallops RESPIRATORY:  Clear to auscultation without rales, wheezing or rhonchi  ABDOMEN: Soft, non-tender, non-distended EXTREMITIES:  No edema; No acute deformity     Asessement and Plan:.    Nonobstructive CAD: Coronary CTA showed a CAC of 971, placing her in the 99th percentile for age and gender, noted to have extensive, age advanced coronary plaque that is not hemodynamically significant based on FFR.  Today she endorses 2 episodes of nocturnal chest pain greater than 6 weeks ago, pain resolved within an hour, similar to events last year. Episodes occurred within the same week. She denies chest pain or shortness of breath on exertion.  Discussed PET stress, she deferred for now and will notify if she has any exertional symptoms or changes in symptoms. Heart healthy diet and regular cardiovascular exercise encouraged.  Reviewed ED precautions. Will  increase Crestor, please see below. Continue aspirin 81 mg daily, hydrochlorothiazide 12.5 mg daily, lisinopril 20 mg daily.  Hyperlipidemia: Last lipid profile on 12/28/2022 indicated total cholesterol 176, triglycerides 194, HDL 43 and LDL 100, AST 32, ALT 52.  Given known nonobstructive CAD her LDL goal is less than 70.  Check LFTs today given history of elevated LFTs. Will increase her Crestor to 40 mg daily with repeat LFTs and fasting lipid profile in 2 months.  If still not at goal with increase will refer to lipid clinic.   Mild dilation of aorta: Noted to have stable dilation of ascending aorta measuring 39 mm on echo in 2022 and 2023.  Also noted to have borderline dilation of aortic root measuring 37 mm on echo in 2023. Repeat echocardiogram for monitoring.  HTN: Blood pressure well controlled today at 116/76. Continue antihypertensive regimen as noted above.  DM type 2: Last hemoglobin A1c 7.1, monitored and managed per PCP.  Hypothyroidism: Last TSH 1.66.  Monitored and managed per PCP.    Disposition: F/u with Dr. Izora Ribas  in one year or sooner if needed.   Signed, Rip Harbour, NP

## 2023-06-21 ENCOUNTER — Ambulatory Visit: Payer: BC Managed Care – PPO | Attending: Physician Assistant | Admitting: Cardiology

## 2023-06-21 ENCOUNTER — Encounter: Payer: Self-pay | Admitting: Physician Assistant

## 2023-06-21 VITALS — BP 116/76 | HR 74 | Ht 67.0 in | Wt 183.2 lb

## 2023-06-21 DIAGNOSIS — I2510 Atherosclerotic heart disease of native coronary artery without angina pectoris: Secondary | ICD-10-CM | POA: Diagnosis not present

## 2023-06-21 DIAGNOSIS — Z79899 Other long term (current) drug therapy: Secondary | ICD-10-CM | POA: Diagnosis not present

## 2023-06-21 DIAGNOSIS — I77819 Aortic ectasia, unspecified site: Secondary | ICD-10-CM | POA: Diagnosis not present

## 2023-06-21 DIAGNOSIS — E1159 Type 2 diabetes mellitus with other circulatory complications: Secondary | ICD-10-CM

## 2023-06-21 DIAGNOSIS — I152 Hypertension secondary to endocrine disorders: Secondary | ICD-10-CM

## 2023-06-21 DIAGNOSIS — Z8249 Family history of ischemic heart disease and other diseases of the circulatory system: Secondary | ICD-10-CM

## 2023-06-21 DIAGNOSIS — E785 Hyperlipidemia, unspecified: Secondary | ICD-10-CM

## 2023-06-21 DIAGNOSIS — E1169 Type 2 diabetes mellitus with other specified complication: Secondary | ICD-10-CM

## 2023-06-21 MED ORDER — ROSUVASTATIN CALCIUM 40 MG PO TABS: 40.0000 mg | ORAL_TABLET | Freq: Every day | ORAL | 3 refills | Status: DC

## 2023-06-21 NOTE — Patient Instructions (Signed)
Medication Instructions:   INCREASE YOUR ROSUVASTATIN (CRESTOR) TO 40 MG BY MOUTH   *If you need a refill on your cardiac medications before your next appointment, please call your pharmacy*   Lab Work:  1.)  TODAY--LFTs  2.)  IN 2 MONTHS HERE IN THE OFFICE--LIPIDS AND LFTs--PLEASE COME FASTING TO THIS LAB APPOINTMENT  If you have labs (blood work) drawn today and your tests are completely normal, you will receive your results only by: MyChart Message (if you have MyChart) OR A paper copy in the mail If you have any lab test that is abnormal or we need to change your treatment, we will call you to review the results.   Testing/Procedures:  Your physician has requested that you have an echocardiogram. Echocardiography is a painless test that uses sound waves to create images of your heart. It provides your doctor with information about the size and shape of your heart and how well your heart's chambers and valves are working. This procedure takes approximately one hour. There are no restrictions for this procedure. Please do NOT wear cologne, perfume, aftershave, or lotions (deodorant is allowed). Please arrive 15 minutes prior to your appointment time.    Follow-Up: At [facility], you and your health needs are our priority.  As part of our continuing mission to provide you with exceptional heart care, we have created designated Provider Care Teams.  These Care Teams include your primary Cardiologist (physician) and Advanced Practice Providers (APPs -  Physician Assistants and Nurse Practitioners) who all work together to provide you with the care you need, when you need it.  We recommend signing up for the patient portal called "MyChart".  Sign up information is provided on this After Visit Summary.  MyChart is used to connect with patients for Virtual Visits (Telemedicine).  Patients are able to view lab/test results, encounter notes, upcoming appointments, etc.  Non-urgent  messages can be sent to your provider as well.   To learn more about what you can do with MyChart, go to [email or web address].    Your next appointment:   1 YEAR   Provider:   DR. Izora Ribas

## 2023-06-22 ENCOUNTER — Other Ambulatory Visit: Payer: Self-pay | Admitting: Nurse Practitioner

## 2023-06-22 ENCOUNTER — Telehealth: Payer: Self-pay

## 2023-06-22 DIAGNOSIS — F4320 Adjustment disorder, unspecified: Secondary | ICD-10-CM

## 2023-06-22 LAB — HEPATIC FUNCTION PANEL
ALT: 84 [IU]/L — ABNORMAL HIGH (ref 0–32)
AST: 46 [IU]/L — ABNORMAL HIGH (ref 0–40)
Albumin: 5.2 g/dL — ABNORMAL HIGH (ref 3.8–4.9)
Alkaline Phosphatase: 70 [IU]/L (ref 44–121)
Bilirubin Total: 0.3 mg/dL (ref 0.0–1.2)
Bilirubin, Direct: <0.1 mg/dL (ref 0.00–0.40)
Total Protein: 7.8 g/dL (ref 6.0–8.5)

## 2023-06-22 NOTE — Telephone Encounter (Signed)
Spoke with patient about recent lab work, no changes in care. No needs at this time

## 2023-07-03 ENCOUNTER — Encounter: Payer: Self-pay | Admitting: Family Medicine

## 2023-07-03 ENCOUNTER — Ambulatory Visit: Payer: BC Managed Care – PPO | Admitting: Family Medicine

## 2023-07-03 VITALS — BP 118/70 | HR 70 | Resp 18 | Ht 67.0 in | Wt 187.0 lb

## 2023-07-03 DIAGNOSIS — Z23 Encounter for immunization: Secondary | ICD-10-CM | POA: Diagnosis not present

## 2023-07-03 DIAGNOSIS — E785 Hyperlipidemia, unspecified: Secondary | ICD-10-CM

## 2023-07-03 DIAGNOSIS — E1159 Type 2 diabetes mellitus with other circulatory complications: Secondary | ICD-10-CM

## 2023-07-03 DIAGNOSIS — E119 Type 2 diabetes mellitus without complications: Secondary | ICD-10-CM | POA: Diagnosis not present

## 2023-07-03 DIAGNOSIS — Z794 Long term (current) use of insulin: Secondary | ICD-10-CM

## 2023-07-03 DIAGNOSIS — F411 Generalized anxiety disorder: Secondary | ICD-10-CM

## 2023-07-03 DIAGNOSIS — E1169 Type 2 diabetes mellitus with other specified complication: Secondary | ICD-10-CM | POA: Diagnosis not present

## 2023-07-03 DIAGNOSIS — I152 Hypertension secondary to endocrine disorders: Secondary | ICD-10-CM

## 2023-07-03 LAB — POCT GLYCOSYLATED HEMOGLOBIN (HGB A1C): HbA1c POC (<> result, manual entry): 8.1 % (ref 4.0–5.6)

## 2023-07-03 NOTE — Assessment & Plan Note (Signed)
Last lipid panel 6 months ago: LDL 100, HDL 43, triglycerides 194.  Repeat lipid panel and CMP.

## 2023-07-03 NOTE — Patient Instructions (Addendum)
We will schedule your annual physical appointment about 3 months from now.  Once we get a copy of the labs from your endocrinologist, I will make sure that there are no additional labs that we need to track.  If we do, we can have those drawn the morning of your appointment.

## 2023-07-03 NOTE — Progress Notes (Signed)
Established Patient Office Visit  Subjective   Patient ID: Jessica Chang, female    DOB: October 09, 1970  Age: 52 y.o. MRN: [id number]  Chief Complaint  Patient presents with   Anxiety   Depression   Diabetes    HPI Jessica Chang is a 52 y.o. female presenting today for follow up of mood. Mood: Patient is here to follow up for depression and anxiety, currently managing with Prozac 40 mg daily. Taking medication without side effects, reports excellent compliance with treatment. Denies mood changes or SI/HI. She feels mood is  fairly stable  since last visit.  She believes that her decreased interest and energy is more so related to chronic pain than anything else.  She would like to stay at her current dose of Prozac for now.     07/03/2023    2:47 PM 12/29/2022    3:30 PM 06/29/2022    3:17 PM  Depression screen PHQ 2/9  Decreased Interest 1 0 0  Down, Depressed, Hopeless 1 0 0  PHQ - 2 Score 2 0 0  Altered sleeping 0 1 0  Tired, decreased energy 2 1 1   Change in appetite 2 0 0  Feeling bad or failure about yourself  1 0 0  Trouble concentrating 0 0 0  Moving slowly or fidgety/restless 0 0 0  Suicidal thoughts 0 0 0  PHQ-9 Score 7 2 1   Difficult doing work/chores Not difficult at all  Not difficult at all       07/03/2023    2:47 PM 06/29/2022    3:17 PM 06/30/2021    3:21 PM 04/24/2019    2:57 PM  GAD 7 : Generalized Anxiety Score  Nervous, Anxious, on Edge 0 1 1 0  Control/stop worrying 0 0 0 0  Worry too much - different things 0 0 0 0  Trouble relaxing 0 0 0 0  Restless 0 0 0 0  Easily annoyed or irritable 0 0 0 0  Afraid - awful might happen 1 0 0 0  Total GAD 7 Score 1 1 1  0  Anxiety Difficulty Not difficult at all Not difficult at all Not difficult at all Not difficult at all   Outpatient Medications Prior to Visit  Medication Sig   aspirin EC 81 MG tablet Take 1 tablet (81 mg total) by mouth daily. Swallow whole.   augmented betamethasone dipropionate  (DIPROLENE-AF) 0.05 % cream Apply topically as needed. scoroisis   cetirizine (ZYRTEC) 10 MG tablet Take 10 mg by mouth daily.   clobetasol (TEMOVATE) 0.05 % external solution Apply 1 Application topically as needed.   EPINEPHrine 0.3 mg/0.3 mL IJ SOAJ injection Inject 0.3 mLs (0.3 mg total) into the muscle as needed for anaphylaxis.   FLUoxetine (PROZAC) 40 MG capsule TAKE 1 CAPSULE (40 MG TOTAL) BY MOUTH DAILY.   fluticasone (FLONASE) 50 MCG/ACT nasal spray Place 1 spray into both nostrils daily for 3 days.   glucose blood test strip Check 2 hour postprandial and fasting blood sugars daily; also if you feel poorly   hydrochlorothiazide (MICROZIDE) 12.5 MG capsule TAKE 1 CAPSULE BY MOUTH EVERY DAY   hydrocortisone 2.5 % cream Apply 1 application topically daily as needed (skin irritation).    Ibuprofen 200 MG CAPS Take 600 mg by mouth daily as needed (pain).    Insulin Human (INSULIN PUMP) SOLN Dexcom omnipod 5, 150 units every 3 days   Insulin NPH, Human,, Isophane, (HUMULIN N KWIKPEN) 100 UNIT/ML Kiwkpen  (Patient not  taking: Reported on 06/21/2023)   JARDIANCE 25 MG TABS tablet Take 25 mg by mouth daily.   levonorgestrel (MIRENA) 20 MCG/24HR IUD 1 each by Intrauterine route continuous.    levothyroxine (SYNTHROID) 88 MCG tablet Take 75 mcg by mouth daily before breakfast.   lisinopril (PRINIVIL,ZESTRIL) 20 MG tablet Take 20 mg by mouth daily.   rosuvastatin (CRESTOR) 40 MG tablet Take 1 tablet (40 mg total) by mouth daily.   valACYclovir (VALTREX) 500 MG tablet Take 500 mg by mouth as needed. breakouts   No facility-administered medications prior to visit.    ROS Negative unless otherwise noted in HPI   Objective:     BP 118/70 (BP Location: Left Arm, Patient Position: Sitting, Cuff Size: Normal)   Pulse 70   Resp 18   Ht 5\' 7"  (1.702 m)   Wt 187 lb (84.8 kg)   SpO2 98%   BMI 29.29 kg/m   Physical Exam Constitutional:      General: She is not in acute distress.     Appearance: Normal appearance.  HENT:     Head: Normocephalic and atraumatic.  Cardiovascular:     Rate and Rhythm: Normal rate and regular rhythm.     Heart sounds: No murmur heard.    No friction rub. No gallop.  Pulmonary:     Effort: Pulmonary effort is normal. No respiratory distress.     Breath sounds: No wheezing, rhonchi or rales.  Skin:    General: Skin is warm and dry.  Neurological:     Mental Status: She is alert and oriented to person, place, and time.    Diabetic Foot Exam - Simple   Simple Foot Form Diabetic Foot exam was performed with the following findings: Yes 07/03/2023  3:10 PM  Visual Inspection No deformities, no ulcerations, no other skin breakdown bilaterally: Yes Sensation Testing Intact to touch and monofilament testing bilaterally: Yes Pulse Check Posterior Tibialis and Dorsalis pulse intact bilaterally: Yes Comments     Assessment & Plan:  Type 2 diabetes mellitus without complication, with long-term current use of insulin (HCC) Assessment & Plan: Managed by endocrinology.  A1c likely elevated due to steroid shots, COVID infection, and use of Paxlovid within the past few months.  Upcoming labs with endocrinology next week and upcoming office visit next month.  Orders: -     POCT glycosylated hemoglobin (Hb A1C)  Need for influenza vaccination -     Flu vaccine trivalent PF, 6mos and older(Flulaval,Afluria,Fluarix,Fluzone)  Hyperlipidemia associated with type 2 diabetes mellitus (HCC) Assessment & Plan: Last lipid panel 6 months ago: LDL 100, HDL 43, triglycerides 194.  Repeat lipid panel and CMP.   Hypertension associated with diabetes (HCC) Assessment & Plan: BP goal <130/80.  Stable, at goal.  Also followed by cardiology.  Continue hydrochlorothiazide 12.5 mg daily, lisinopril 20 mg daily.  Will continue to monitor.   GAD (generalized anxiety disorder) Assessment & Plan: PHQ-9 score slightly elevated at 7, GAD-7 score stable at 1.   Continue Prozac 40 mg daily.  Will continue to monitor.   Need for Tdap vaccination -     Tdap vaccine greater than or equal to 7yo IM  Dr. Talmage Nap at [facility].  Return in about 3 months (around 10/03/2023) for annual physical, morning appointment.    Melida Quitter, PA

## 2023-07-03 NOTE — Assessment & Plan Note (Addendum)
BP goal <130/80.  Stable, at goal.  Also followed by cardiology.  Continue hydrochlorothiazide 12.5 mg daily, lisinopril 20 mg daily.  Will continue to monitor.

## 2023-07-03 NOTE — Assessment & Plan Note (Signed)
PHQ-9 score slightly elevated at 7, GAD-7 score stable at 1.  Continue Prozac 40 mg daily.  Will continue to monitor.

## 2023-07-03 NOTE — Assessment & Plan Note (Signed)
Managed by endocrinology.  A1c likely elevated due to steroid shots, COVID infection, and use of Paxlovid within the past few months.  Upcoming labs with endocrinology next week and upcoming office visit next month.

## 2023-07-05 ENCOUNTER — Encounter: Payer: Self-pay | Admitting: Family Medicine

## 2023-07-06 ENCOUNTER — Ambulatory Visit (HOSPITAL_COMMUNITY): Payer: BC Managed Care – PPO | Attending: Cardiology

## 2023-07-06 DIAGNOSIS — I2510 Atherosclerotic heart disease of native coronary artery without angina pectoris: Secondary | ICD-10-CM | POA: Diagnosis not present

## 2023-07-06 DIAGNOSIS — Z8249 Family history of ischemic heart disease and other diseases of the circulatory system: Secondary | ICD-10-CM | POA: Diagnosis not present

## 2023-07-06 DIAGNOSIS — I77819 Aortic ectasia, unspecified site: Secondary | ICD-10-CM | POA: Diagnosis not present

## 2023-07-06 LAB — ECHOCARDIOGRAM COMPLETE
Area-P 1/2: 3.2 cm2
S' Lateral: 2.6 cm

## 2023-07-10 ENCOUNTER — Telehealth: Payer: Self-pay

## 2023-07-10 NOTE — Telephone Encounter (Signed)
-----   Message from Rip Harbour sent at 07/07/2023  4:16 PM EDT ----- Please let Jessica Chang know that her heart squeeze and function were normal. There are no significant valvular abnormalities. There is mild dilation of the aortic root and ascending aorta, unchanged from prior echocardiograms. Will continue to monitor yearly, follow up with Dr. Izora Ribas next year as planned.

## 2023-07-10 NOTE — Telephone Encounter (Signed)
Left voicemail to return call to office.

## 2023-07-24 DIAGNOSIS — M47812 Spondylosis without myelopathy or radiculopathy, cervical region: Secondary | ICD-10-CM | POA: Diagnosis not present

## 2023-08-01 DIAGNOSIS — M47812 Spondylosis without myelopathy or radiculopathy, cervical region: Secondary | ICD-10-CM | POA: Diagnosis not present

## 2023-08-09 DIAGNOSIS — E119 Type 2 diabetes mellitus without complications: Secondary | ICD-10-CM | POA: Diagnosis not present

## 2023-08-09 DIAGNOSIS — E7800 Pure hypercholesterolemia, unspecified: Secondary | ICD-10-CM | POA: Diagnosis not present

## 2023-08-09 DIAGNOSIS — E039 Hypothyroidism, unspecified: Secondary | ICD-10-CM | POA: Diagnosis not present

## 2023-08-09 LAB — BASIC METABOLIC PANEL
BUN: 22 — AB (ref 4–21)
CO2: 23 — AB (ref 13–22)
Chloride: 99 (ref 99–108)
Creatinine: 0.8 (ref 0.5–1.1)
Glucose: 136
Potassium: 4.5 meq/L (ref 3.5–5.1)
Sodium: 139 (ref 137–147)

## 2023-08-09 LAB — LIPID PANEL
Cholesterol: 165 (ref 0–200)
HDL: 39 (ref 35–70)
LDL Cholesterol: 91
Triglycerides: 202 — AB (ref 40–160)

## 2023-08-09 LAB — COMPREHENSIVE METABOLIC PANEL
Albumin: 5 (ref 3.5–5.0)
Calcium: 10.6 (ref 8.7–10.7)
Globulin: 2.3
eGFR: 90

## 2023-08-09 LAB — HEMOGLOBIN A1C: Hemoglobin A1C: 8.1

## 2023-08-09 LAB — HEPATIC FUNCTION PANEL
ALT: 75 U/L — AB (ref 7–35)
AST: 41 — AB (ref 13–35)
Alkaline Phosphatase: 67 (ref 25–125)
Bilirubin, Total: 0.4

## 2023-08-09 LAB — TSH: TSH: 1.52 (ref 0.41–5.90)

## 2023-08-16 DIAGNOSIS — E119 Type 2 diabetes mellitus without complications: Secondary | ICD-10-CM | POA: Diagnosis not present

## 2023-08-16 DIAGNOSIS — E039 Hypothyroidism, unspecified: Secondary | ICD-10-CM | POA: Diagnosis not present

## 2023-08-16 DIAGNOSIS — R945 Abnormal results of liver function studies: Secondary | ICD-10-CM | POA: Diagnosis not present

## 2023-08-16 DIAGNOSIS — I10 Essential (primary) hypertension: Secondary | ICD-10-CM | POA: Diagnosis not present

## 2023-08-21 ENCOUNTER — Ambulatory Visit: Payer: BC Managed Care – PPO | Attending: Cardiology

## 2023-08-21 DIAGNOSIS — I2510 Atherosclerotic heart disease of native coronary artery without angina pectoris: Secondary | ICD-10-CM

## 2023-08-21 DIAGNOSIS — Z8249 Family history of ischemic heart disease and other diseases of the circulatory system: Secondary | ICD-10-CM

## 2023-08-21 DIAGNOSIS — E1169 Type 2 diabetes mellitus with other specified complication: Secondary | ICD-10-CM

## 2023-08-21 DIAGNOSIS — Z79899 Other long term (current) drug therapy: Secondary | ICD-10-CM

## 2023-08-21 DIAGNOSIS — I77819 Aortic ectasia, unspecified site: Secondary | ICD-10-CM

## 2023-08-24 DIAGNOSIS — R8290 Unspecified abnormal findings in urine: Secondary | ICD-10-CM | POA: Diagnosis not present

## 2023-08-24 DIAGNOSIS — Z01419 Encounter for gynecological examination (general) (routine) without abnormal findings: Secondary | ICD-10-CM | POA: Diagnosis not present

## 2023-08-24 DIAGNOSIS — Z1231 Encounter for screening mammogram for malignant neoplasm of breast: Secondary | ICD-10-CM | POA: Diagnosis not present

## 2023-08-24 DIAGNOSIS — Z6829 Body mass index (BMI) 29.0-29.9, adult: Secondary | ICD-10-CM | POA: Diagnosis not present

## 2023-08-24 DIAGNOSIS — R81 Glycosuria: Secondary | ICD-10-CM | POA: Diagnosis not present

## 2023-09-17 ENCOUNTER — Other Ambulatory Visit: Payer: Self-pay | Admitting: Internal Medicine

## 2023-09-17 DIAGNOSIS — R072 Precordial pain: Secondary | ICD-10-CM

## 2023-09-17 DIAGNOSIS — R0609 Other forms of dyspnea: Secondary | ICD-10-CM

## 2023-09-17 DIAGNOSIS — I152 Hypertension secondary to endocrine disorders: Secondary | ICD-10-CM

## 2023-09-17 DIAGNOSIS — E1169 Type 2 diabetes mellitus with other specified complication: Secondary | ICD-10-CM

## 2023-09-17 DIAGNOSIS — R079 Chest pain, unspecified: Secondary | ICD-10-CM

## 2023-09-17 DIAGNOSIS — Z8249 Family history of ischemic heart disease and other diseases of the circulatory system: Secondary | ICD-10-CM

## 2023-09-21 DIAGNOSIS — M25551 Pain in right hip: Secondary | ICD-10-CM | POA: Diagnosis not present

## 2023-09-25 DIAGNOSIS — M25551 Pain in right hip: Secondary | ICD-10-CM | POA: Diagnosis not present

## 2023-10-04 ENCOUNTER — Ambulatory Visit (INDEPENDENT_AMBULATORY_CARE_PROVIDER_SITE_OTHER): Payer: BC Managed Care – PPO | Admitting: Family Medicine

## 2023-10-04 ENCOUNTER — Encounter: Payer: Self-pay | Admitting: Family Medicine

## 2023-10-04 VITALS — BP 125/76 | HR 75 | Ht 67.0 in | Wt 187.8 lb

## 2023-10-04 DIAGNOSIS — E559 Vitamin D deficiency, unspecified: Secondary | ICD-10-CM | POA: Diagnosis not present

## 2023-10-04 DIAGNOSIS — F411 Generalized anxiety disorder: Secondary | ICD-10-CM | POA: Diagnosis not present

## 2023-10-04 DIAGNOSIS — E119 Type 2 diabetes mellitus without complications: Secondary | ICD-10-CM | POA: Diagnosis not present

## 2023-10-04 DIAGNOSIS — Z794 Long term (current) use of insulin: Secondary | ICD-10-CM

## 2023-10-04 DIAGNOSIS — Z0000 Encounter for general adult medical examination without abnormal findings: Secondary | ICD-10-CM | POA: Diagnosis not present

## 2023-10-04 DIAGNOSIS — Z1159 Encounter for screening for other viral diseases: Secondary | ICD-10-CM

## 2023-10-04 LAB — POCT UA - MICROALBUMIN
Creatinine, POC: 50 mg/dL
Microalbumin Ur, POC: 10 mg/L

## 2023-10-04 MED ORDER — FLUOXETINE HCL 40 MG PO CAPS: 40.0000 mg | ORAL_CAPSULE | Freq: Every day | ORAL | 1 refills | Status: DC

## 2023-10-04 NOTE — Assessment & Plan Note (Signed)
 PHQ-9 score 0, GAD-7 score stable at 2.  Continue Prozac  40 mg daily.  Will continue to monitor.

## 2023-10-04 NOTE — Progress Notes (Signed)
 Complete physical exam  Patient: Jessica Chang   DOB: 06/24/1971   53 y.o. Female  MRN: [id number]  Subjective:    Chief Complaint  Patient presents with   Annual Exam    Jessica Chang is a 53 y.o. female who presents today for a complete physical exam. She reports consuming a  low-carb  diet.  She tries to stay active but her walking routine has been limited by hip pain.  She is being evaluated and followed by [facility] to get her pain under control.  She generally feels fairly well. She reports sleeping well, though her partner reports that she has started snoring. She does not have additional problems to discuss today.  Dr. Balan continues to follow type 2 diabetes and hypothyroidism, CMP, A1c, lipid panel, TSH were checked on 08/09/2023.   Most recent fall risk assessment:    10/04/2023    2:46 PM  Fall Risk   Falls in the past year? 0  Number falls in past yr: 0  Injury with Fall? 0  Risk for fall due to : No Fall Risks  Follow up Falls evaluation completed     Most recent depression and anxiety screenings:    10/04/2023    2:46 PM 07/03/2023    2:47 PM  PHQ 2/9 Scores  PHQ - 2 Score 0 2  PHQ- 9 Score 0 7      10/04/2023    2:46 PM 07/03/2023    2:47 PM 06/29/2022    3:17 PM 06/30/2021    3:21 PM  GAD 7 : Generalized Anxiety Score  Nervous, Anxious, on Edge 1 0 1 1  Control/stop worrying 0 0 0 0  Worry too much - different things 0 0 0 0  Trouble relaxing 0 0 0 0  Restless 0 0 0 0  Easily annoyed or irritable 1 0 0 0  Afraid - awful might happen 0 1 0 0  Total GAD 7 Score 2 1 1 1   Anxiety Difficulty Not difficult at all Not difficult at all Not difficult at all Not difficult at all    Patient Active Problem List   Diagnosis Date Noted   Thoracic aortic aneurysm without rupture (HCC) 04/05/2021   Hyperpituitarism (HCC) 12/29/2020   Coronary artery disease involving native coronary artery of native heart without angina pectoris 12/11/2020   Family  history of aortic aneurysm 08/27/2020   Malignant neoplasm of skin 03/17/2020   Thyroid  disease- management per endocrinology Dr. Rosco Companion    Multiple allergies 05/18/2019   Type 2 diabetes mellitus without complication (HCC) 01/23/2019   Vitamin D  deficiency 06/19/2017   GAD (generalized anxiety disorder) 03/06/2017   Diabetic cataract (HCC) 08/31/2016   h/o Drug-induced acute pancreatitis (2nd [place] or onglyza) 05/27/2016   Chronic Mild Hypercalcemia 05/27/2016   Overweight (BMI 25.0-29.9) 02/21/2016   Enthesopathy of hip 10/19/2014   Hyperlipidemia associated with type 2 diabetes mellitus (HCC) 10/29/2012   Hypertension associated with diabetes (HCC) 10/29/2012    Past Surgical History:  Procedure Laterality Date   CATARACT EXTRACTION  2020   CERVICAL SPINE SURGERY     CESAREAN SECTION  2008   CHOLECYSTECTOMY  2005   HIP ARTHROSCOPY Right 2011, 2016   KNEE ARTHROSCOPY Right 1988   SKIN CANCER EXCISION  2021   TONSILECTOMY, ADENOIDECTOMY, BILATERAL MYRINGOTOMY AND TUBES  1989   TONSILLECTOMY  1989   WISDOM TOOTH EXTRACTION  1990   Social History   Tobacco Use   Smoking status: Former  Current packs/day: 0.00    Average packs/day: 0.3 packs/day for 5.0 years (1.3 ttl pk-yrs)    Types: Cigarettes    Start date: 09/19/2000    Quit date: 09/19/2005    Years since quitting: 18.0   Smokeless tobacco: Never   Tobacco comments:    Quit 2007  Vaping Use   Vaping status: Never Used  Substance Use Topics   Alcohol use: Yes    Comment: occassionally   Drug use: No   Family History  Problem Relation Age of Onset   Heart disease Mother    Basal cell carcinoma Mother    Diabetes Father    Hyperlipidemia Father    Hypertension Father    Colon polyps Father 36   Prostate cancer Father 76   Melanoma Father    Aneurysm Father    AAA (abdominal aortic aneurysm) Father 49       Thoracic aorta repair and AVR   Alcohol abuse Sister    Mental illness Sister    Asthma Sister     Urticaria Sister    Allergic rhinitis Sister    Heart attack Paternal Aunt    Melanoma Paternal Aunt    Breast cancer Maternal Grandmother    AAA (abdominal aortic aneurysm) Maternal Grandfather    Diabetes Paternal Grandmother    Stroke Paternal Grandfather    Melanoma Paternal Grandfather    Colon cancer Neg Hx    Esophageal cancer Neg Hx    Rectal cancer Neg Hx    Stomach cancer Neg Hx    Allergies  Allergen Reactions   Januvia [Sitagliptin] Other (See Comments)    Pancreatitis   Onglyza [Saxagliptin] Other (See Comments)    Pancreatitis to all DPP 4 inhibitor medications   Cherry Swelling and Itching   Lidocaine     Other reaction(s): Other (See Comments) Blisters   Peach [Prunus Persica] Other (See Comments)    Facial tingling   Saxagliptin-Metformin Er Other (See Comments)     Patient Care Team: Noreene Bearded, PA as PCP - General (Family Medicine) Jann Melody, MD as PCP - Cardiology (Cardiology) [place], Amy, MD as Consulting Physician (Dermatology) Marcelino Sera, MD as Attending Physician (Orthopedic Surgery) Graciella Lavender, PA as Physician Assistant (Gastroenterology) Tasia Farr, MD as Consulting Physician (Endocrinology) Ashby Lawman, MD as Consulting Physician (Obstetrics and Gynecology) Nelva Bang, OD as Referring Physician (Optometry) Ronni Colace, MD as Consulting Physician (Ophthalmology) Jann Melody, MD as Consulting Physician (Cardiology)   Outpatient Medications Prior to Visit  Medication Sig   aspirin  EC 81 MG tablet Take 1 tablet (81 mg total) by mouth daily. Swallow whole.   augmented betamethasone  dipropionate (DIPROLENE -AF) 0.05 % cream Apply topically as needed. scoroisis   cetirizine (ZYRTEC) 10 MG tablet Take 10 mg by mouth daily.   clobetasol (TEMOVATE) 0.05 % external solution Apply 1 Application topically as needed.   Continuous Glucose Sensor (DEXCOM G6 SENSOR) MISC Apply 1 each  topically as directed.   Continuous Glucose Transmitter (DEXCOM G6 TRANSMITTER) MISC APPLY AS DIRECTED 90   EPINEPHrine  0.3 mg/0.3 mL IJ SOAJ injection Inject 0.3 mLs (0.3 mg total) into the muscle as needed for anaphylaxis.   glucose blood test strip Check 2 hour postprandial and fasting blood sugars daily; also if you feel poorly   HUMALOG 100 UNIT/ML injection Inject 50 Units into the skin daily. By insulin  pump   hydrochlorothiazide  (MICROZIDE ) 12.5 MG capsule TAKE 1 CAPSULE BY MOUTH EVERY DAY   hydrocortisone  2.5 % cream Apply 1 application topically daily as needed (skin irritation).    Ibuprofen 200 MG CAPS Take 600 mg by mouth daily as needed (pain).    Insulin  Disposable Pump (OMNIPOD 5 [id number] PODS GEN 5) MISC SMARTSIG:3 SUB-Q Every 3 Days   Insulin  Human (INSULIN  PUMP) SOLN Dexcom omnipod 5, 150 units every 3 days   Insulin  NPH, Human,, Isophane, (HUMULIN N KWIKPEN) 100 UNIT/ML Kiwkpen    JARDIANCE 25 MG TABS tablet Take 25 mg by mouth daily.   levonorgestrel  (MIRENA ) 20 MCG/24HR IUD 1 each by Intrauterine route continuous.    levothyroxine (SYNTHROID) 88 MCG tablet Take 75 mcg by mouth daily before breakfast.   lisinopril  (PRINIVIL ,ZESTRIL ) 20 MG tablet Take 20 mg by mouth daily.   rosuvastatin  (CRESTOR ) 40 MG tablet Take 1 tablet (40 mg total) by mouth daily.   valACYclovir (VALTREX) 500 MG tablet Take 500 mg by mouth as needed. breakouts   [DISCONTINUED] FLUoxetine  (PROZAC ) 40 MG capsule TAKE 1 CAPSULE (40 MG TOTAL) BY MOUTH DAILY.   fluticasone  (FLONASE ) 50 MCG/ACT nasal spray Place 1 spray into both nostrils daily for 3 days.   No facility-administered medications prior to visit.    Review of Systems  Constitutional:  Negative for chills, fever and malaise/fatigue.  HENT:  Negative for congestion and hearing loss.   Eyes:  Negative for blurred vision and double vision.  Respiratory:  Negative for cough and shortness of breath.   Cardiovascular:  Negative for chest pain,  palpitations and leg swelling.  Gastrointestinal:  Negative for abdominal pain, constipation, diarrhea and heartburn.  Genitourinary:  Negative for frequency and urgency.  Musculoskeletal:  Positive for joint pain (hip, see HPI). Negative for myalgias and neck pain.  Neurological:  Negative for headaches.  Endo/Heme/Allergies:  Negative for polydipsia.  Psychiatric/Behavioral:  Negative for depression. The patient is not nervous/anxious and does not have insomnia.       Objective:    BP 125/76   Pulse 75   Ht 5\' 7"  (1.702 m)   Wt 187 lb 12 oz (85.2 kg)   SpO2 96%   BMI 29.41 kg/m    Physical Exam Constitutional:      General: She is not in acute distress.    Appearance: Normal appearance.  HENT:     Head: Normocephalic and atraumatic.     Right Ear: Tympanic membrane, ear canal and external ear normal.     Left Ear: Tympanic membrane, ear canal and external ear normal.     Nose: Nose normal.     Mouth/Throat:     Mouth: Mucous membranes are moist.     Pharynx: No oropharyngeal exudate or posterior oropharyngeal erythema.  Eyes:     Extraocular Movements: Extraocular movements intact.     Conjunctiva/sclera: Conjunctivae normal.     Pupils: Pupils are equal, round, and reactive to light.  Neck:     Thyroid : No thyroid  mass, thyromegaly or thyroid  tenderness.  Cardiovascular:     Rate and Rhythm: Normal rate and regular rhythm.     Heart sounds: Normal heart sounds. No murmur heard.    No friction rub. No gallop.  Pulmonary:     Effort: Pulmonary effort is normal. No respiratory distress.     Breath sounds: Normal breath sounds. No wheezing, rhonchi or rales.  Abdominal:     General: Abdomen is flat. Bowel sounds are normal. There is no distension.     Palpations: There is no mass.     Tenderness: There  is no abdominal tenderness. There is no guarding.  Musculoskeletal:        General: Normal range of motion.     Cervical back: Normal range of motion and neck supple.   Lymphadenopathy:     Cervical: No cervical adenopathy.  Skin:    General: Skin is warm and dry.  Neurological:     Mental Status: She is alert and oriented to person, place, and time.     Cranial Nerves: No cranial nerve deficit.     Motor: No weakness.     Deep Tendon Reflexes: Reflexes normal.  Psychiatric:        Mood and Affect: Mood normal.    Results for orders placed or performed in visit on 10/04/23  POCT UA - Microalbumin  Result Value Ref Range   Microalbumin Ur, POC 10 mg/L   Creatinine, POC 50 mg/dL   Albumin/Creatinine Ratio, Urine, POC 30-300mg /g       Assessment & Plan:    Routine Health Maintenance and Physical Exam  Immunization History  Administered Date(s) Administered   Influenza Inj Mdck Quad With Preservative 07/21/2017   Influenza, Quadrivalent, Recombinant, Inj, Pf 06/15/2020   Influenza, Seasonal, Injecte, Preservative Fre 07/03/2023   MMR 04/18/2008   Moderna SARS-COV2 Booster Vaccination 08/12/2020   PFIZER(Purple Top)SARS-COV-2 Vaccination 10/08/2019, 10/29/2019   Pfizer Covid-19 Vaccine Bivalent Booster 76yrs & up 06/11/2021   Pneumococcal Polysaccharide-23 08/31/2016   Tdap 07/03/2023   Zoster Recombinant(Shingrix ) 12/30/2021, 12/29/2022    Health Maintenance  Topic Date Due   Hepatitis C Screening  Never done   Pneumococcal Vaccine 60-41 Years old (2 of 2 - PCV) 08/31/2017   Diabetic kidney evaluation - eGFR measurement  12/09/2022   COVID-19 Vaccine (4 - 2024-25 season) 10/20/2023 (Originally 05/21/2023)   HEMOGLOBIN A1C  01/01/2024   OPHTHALMOLOGY EXAM  01/19/2024   Cervical Cancer Screening (HPV/Pap Cotest)  06/02/2024   FOOT EXAM  07/02/2024   MAMMOGRAM  08/02/2024   Diabetic kidney evaluation - Urine ACR  10/03/2024   Colonoscopy  06/17/2027   DTaP/Tdap/Td (2 - Td or Tdap) 07/02/2033   INFLUENZA VACCINE  Completed   HIV Screening  Completed   Zoster Vaccines- Shingrix   Completed   HPV VACCINES  Aged Out    Reviewed most  recent labs including CMP, lipid panel, A1C, TSH from endocrinology.  Ordering CBC, vitamin D , and hepatitis C screening labs to be completed within the next week.  Will also collect urine sample for urine microalbumin.  Discussed health benefits of physical activity, and encouraged her to engage in regular exercise appropriate for her age and condition.  Wellness examination  Vitamin D  deficiency -     VITAMIN D  25 Hydroxy (Vit-D Deficiency, Fractures); Future  GAD (generalized anxiety disorder) Assessment & Plan: PHQ-9 score 0, GAD-7 score stable at 2.  Continue Prozac  40 mg daily.  Will continue to monitor.  Orders: -     FLUoxetine  HCl; Take 1 capsule (40 mg total) by mouth daily.  Dispense: 90 capsule; Refill: 1 -     CBC with Differential/Platelet; Future  Screening for viral disease -     Hepatitis C antibody; Future  Type 2 diabetes mellitus without complication, with long-term current use of insulin  (HCC) -     POCT UA - Microalbumin    Return in about 1 week (around 10/11/2023) for nonfasting labs; 6 months for mood (labs with Dr. Ronelle Coffee).     Noreene Bearded, PA

## 2023-10-10 ENCOUNTER — Encounter: Payer: Self-pay | Admitting: Family Medicine

## 2023-10-11 ENCOUNTER — Encounter: Payer: Self-pay | Admitting: Family Medicine

## 2023-10-13 ENCOUNTER — Other Ambulatory Visit: Payer: BC Managed Care – PPO

## 2023-10-13 ENCOUNTER — Other Ambulatory Visit: Payer: Self-pay

## 2023-10-13 ENCOUNTER — Ambulatory Visit
Admission: EM | Admit: 2023-10-13 | Discharge: 2023-10-13 | Disposition: A | Discharge status: Home / Self Care | Payer: BC Managed Care – PPO | Attending: Physician Assistant | Admitting: Physician Assistant

## 2023-10-13 VITALS — BP 117/79 | HR 81 | Temp 98.2°F | Resp 18

## 2023-10-13 DIAGNOSIS — J029 Acute pharyngitis, unspecified: Secondary | ICD-10-CM

## 2023-10-13 DIAGNOSIS — E559 Vitamin D deficiency, unspecified: Secondary | ICD-10-CM

## 2023-10-13 DIAGNOSIS — R591 Generalized enlarged lymph nodes: Secondary | ICD-10-CM | POA: Diagnosis not present

## 2023-10-13 DIAGNOSIS — F411 Generalized anxiety disorder: Secondary | ICD-10-CM | POA: Diagnosis not present

## 2023-10-13 DIAGNOSIS — Z1159 Encounter for screening for other viral diseases: Secondary | ICD-10-CM

## 2023-10-13 LAB — POCT MONO SCREEN (KUC): Mono, POC: NEGATIVE

## 2023-10-13 LAB — POCT RAPID STREP A (OFFICE)

## 2023-10-13 MED ORDER — AMOXICILLIN-POT CLAVULANATE 875-125 MG PO TABS: 1.0000 | ORAL_TABLET | Freq: Two times a day (BID) | ORAL | 0 refills | Status: DC

## 2023-10-13 NOTE — Discharge Instructions (Signed)
Your strep and mono were negative.  You already have a CBC pending with your primary care and they will contact you if this is abnormal.  As we discussed, usually swollen lymph nodes are related to an infection so we are going to start Augmentin twice daily for 7 days.  Gargle with warm salt water and use Tylenol ibuprofen as needed.  If your symptoms are not improving within a week you should follow-up with your primary care as you may need additional testing including blood work and/or imaging.  If anything worsens you have sudden increase in size of the lymph node, increased pain, fever, night sweats, weakness, body aches, difficulty swallowing you need to be seen immediately.

## 2023-10-13 NOTE — ED Triage Notes (Addendum)
Pt reports swollen ?lymph node left side of neck x 4 days. States it is tender to touch and she can feel it when she swallows. Reports "cough and sinus stuff" for the last 6 weeks. Current blood sugar 137 per pt dexcom

## 2023-10-13 NOTE — ED Provider Notes (Signed)
[facility]    CSN: [id number] Arrival date & time: 10/13/23  1554      History   Chief Complaint Chief Complaint  Patient presents with   Neck Pain    HPI Aldonia Keeven is a 53 y.o. female.   Patient presents today with a 4 to 5-day history of enlarging and painful lymph node.  She reports that this is not particularly tender at rest but does feel strange with swallowing or if she palpates the area.  She has had ongoing congestion and cough for about 6 to 8 weeks that is at its baseline.  Denies any significant change in her symptoms including development of fever, shortness of breath, difficulty swallowing, dysphagia, globus sensation.  She does have a history of hypothyroidism but denies history of thyroiditis or thyroid nodule.  She denies any night sweats, unintentional weight loss, fevers.  She has not tried any over-the-counter medication for symptom management.  She denies any known sick contacts.  She did see her primary care last week and had blood work drawn today but has not yet received these results.  She is able to eat and drink normally without difficulty.  Denies any recent antibiotics or steroids.  She reports that discomfort is rated 2 on a 0-10 pain scale, described as a tightness with periodic sharp pain, no alleviating factors identified.    Past Medical History:  Diagnosis Date   Acute pancreatitis    Allergy    seasonal    Arthritis    2021 right hip   Cataract    2020   COVID    Diabetes (HCC)    Gallstones    Hyperlipidemia    Hypertension    Pneumonia    Squamous cell carcinoma    skin-sx on 2021   Thyroid disease    2016    Patient Active Problem List   Diagnosis Date Noted   Thoracic aortic aneurysm without rupture (HCC) 04/05/2021   Hyperpituitarism (HCC) 12/29/2020   Coronary artery disease involving native coronary artery of native heart without angina pectoris 12/11/2020   Family history of aortic aneurysm 08/27/2020    Malignant neoplasm of skin 03/17/2020   Thyroid disease- management per endocrinology Dr. Romero Belling    Multiple allergies 05/18/2019   Type 2 diabetes mellitus without complication (HCC) 01/23/2019   Vitamin D deficiency 06/19/2017   GAD (generalized anxiety disorder) 03/06/2017   Diabetic cataract (HCC) 08/31/2016   h/o Drug-induced acute pancreatitis (2nd [place] or onglyza) 05/27/2016   Chronic Mild Hypercalcemia 05/27/2016   Overweight (BMI 25.0-29.9) 02/21/2016   Enthesopathy of hip 10/19/2014   Hyperlipidemia associated with type 2 diabetes mellitus (HCC) 10/29/2012   Hypertension associated with diabetes (HCC) 10/29/2012    Past Surgical History:  Procedure Laterality Date   CATARACT EXTRACTION  2020   CERVICAL SPINE SURGERY     CESAREAN SECTION  2008   CHOLECYSTECTOMY  2005   HIP ARTHROSCOPY Right 2011, 2016   KNEE ARTHROSCOPY Right 1988   SKIN CANCER EXCISION  2021   TONSILECTOMY, ADENOIDECTOMY, BILATERAL MYRINGOTOMY AND TUBES  1989   TONSILLECTOMY  1989   WISDOM TOOTH EXTRACTION  1990    OB History   No obstetric history on file.      Home Medications    Prior to Admission medications   Medication Sig Start Date End Date Taking? Authorizing Provider  amoxicillin-clavulanate (AUGMENTIN) 875-125 MG tablet Take 1 tablet by mouth every 12 (twelve) hours. 10/13/23  Yes Sereena Marando, Noberto Retort,  PA-C  aspirin EC 81 MG tablet Take 1 tablet (81 mg total) by mouth daily. Swallow whole. 12/11/20  Yes Chandrasekhar, Mahesh A, MD  augmented betamethasone dipropionate (DIPROLENE-AF) 0.05 % cream Apply topically as needed. scoroisis 02/09/22  Yes [provider]  cetirizine (ZYRTEC) 10 MG tablet Take 10 mg by mouth daily.   Yes [provider]  clobetasol (TEMOVATE) 0.05 % external solution Apply 1 Application topically as needed. 02/08/22  Yes [provider]  Continuous Glucose Sensor (DEXCOM G6 SENSOR) MISC Apply 1 each topically as directed. 09/08/23  Yes  [provider]  EPINEPHrine 0.3 mg/0.3 mL IJ SOAJ injection Inject 0.3 mLs (0.3 mg total) into the muscle as needed for anaphylaxis. 04/24/19  Yes Opalski, Gavin Pound, DO  FLUoxetine (PROZAC) 40 MG capsule Take 1 capsule (40 mg total) by mouth daily. 10/04/23  Yes Saralyn Pilar A, PA  HUMALOG 100 UNIT/ML injection Inject 50 Units into the skin daily. By insulin pump 09/17/23  Yes [provider]  hydrochlorothiazide (MICROZIDE) 12.5 MG capsule TAKE 1 CAPSULE BY MOUTH EVERY DAY 09/18/23  Yes Chandrasekhar, Mahesh A, MD  hydrocortisone 2.5 % cream Apply 1 application topically daily as needed (skin irritation).  07/20/20  Yes [provider]  Insulin Disposable Pump (OMNIPOD 5 DEXG7G6 PODS GEN 5) MISC SMARTSIG:3 SUB-Q Every 3 Days 09/17/23  Yes [provider]  Insulin Human (INSULIN PUMP) SOLN Dexcom omnipod 5, 150 units every 3 days   Yes [provider]  JARDIANCE 25 MG TABS tablet Take 25 mg by mouth daily. 01/27/22  Yes [provider]  levonorgestrel (MIRENA) 20 MCG/24HR IUD 1 each by Intrauterine route continuous.  02/05/10  Yes [provider]  levothyroxine (SYNTHROID) 88 MCG tablet Take 75 mcg by mouth daily before breakfast. 04/26/19  Yes [provider]  lisinopril (PRINIVIL,ZESTRIL) 20 MG tablet Take 20 mg by mouth daily. 04/03/17  Yes [provider]  rosuvastatin (CRESTOR) 40 MG tablet Take 1 tablet (40 mg total) by mouth daily. 06/21/23  Yes West, Katlyn D, NP  valACYclovir (VALTREX) 500 MG tablet Take 500 mg by mouth as needed. breakouts 04/19/22  Yes [provider]  Continuous Glucose Transmitter (DEXCOM G6 TRANSMITTER) MISC APPLY AS DIRECTED 90 07/06/23   [provider]  fluticasone (FLONASE) 50 MCG/ACT nasal spray Place 1 spray into both nostrils daily for 3 days. Patient not taking: Reported on 10/13/2023 04/20/22 04/23/22  Gustavus Bryant, FNP  glucose blood test strip Check 2 hour postprandial and  fasting blood sugars daily; also if you feel poorly 05/25/16   Thomasene Lot, DO  Ibuprofen 200 MG CAPS Take 600 mg by mouth daily as needed (pain).  Patient not taking: Reported on 10/13/2023    [provider]  Insulin NPH, Human,, Isophane, (HUMULIN N KWIKPEN) 100 UNIT/ML Kiwkpen     [provider]    Family History Family History  Problem Relation Age of Onset   Heart disease Mother    Basal cell carcinoma Mother    Diabetes Father    Hyperlipidemia Father    Hypertension Father    Colon polyps Father 58   Prostate cancer Father 48   Melanoma Father    Aneurysm Father    AAA (abdominal aortic aneurysm) Father 94       Thoracic aorta repair and AVR   Alcohol abuse Sister    Mental illness Sister    Asthma Sister    Urticaria Sister    Allergic rhinitis Sister  Heart attack Paternal Aunt    Melanoma Paternal Aunt    Breast cancer Maternal Grandmother    AAA (abdominal aortic aneurysm) Maternal Grandfather    Diabetes Paternal Grandmother    Stroke Paternal Grandfather    Melanoma Paternal Grandfather    Colon cancer Neg Hx    Esophageal cancer Neg Hx    Rectal cancer Neg Hx    Stomach cancer Neg Hx     Social History Social History   Tobacco Use   Smoking status: Former    Current packs/day: 0.00    Average packs/day: 0.3 packs/day for 5.0 years (1.3 ttl pk-yrs)    Types: Cigarettes    Start date: 09/19/2000    Quit date: 09/19/2005    Years since quitting: 18.0   Smokeless tobacco: Never   Tobacco comments:    Quit 2007  Vaping Use   Vaping status: Never Used  Substance Use Topics   Alcohol use: Yes    Comment: occassionally   Drug use: No     Allergies   Januvia [sitagliptin], Onglyza [saxagliptin], Cherry, Lidocaine, Peach [prunus persica], and Saxagliptin-metformin er   Review of Systems Review of Systems  Constitutional:  Positive for activity change. Negative for appetite change, fatigue and fever.  HENT:  Positive for  congestion. Negative for sinus pressure, sneezing, sore throat, trouble swallowing and voice change.   Respiratory:  Positive for cough. Negative for shortness of breath.   Cardiovascular:  Negative for chest pain.  Gastrointestinal:  Negative for abdominal pain, diarrhea, nausea and vomiting.  Hematological:  Positive for adenopathy.     Physical Exam Triage Vital Signs ED Triage Vitals  Encounter Vitals Group     BP 10/13/23 1629 117/79     Systolic BP Percentile --      Diastolic BP Percentile --      Pulse Rate 10/13/23 1629 81     Resp 10/13/23 1629 18     Temp 10/13/23 1629 98.2 F (36.8 C)     Temp Source 10/13/23 1629 Oral     SpO2 10/13/23 1629 96 %     Weight --      Height --      Head Circumference --      Peak Flow --      Pain Score 10/13/23 1625 2     Pain Loc --      Pain Education --      Exclude from Growth Chart --    No data found.  Updated Vital Signs BP 117/79 (BP Location: Left Arm)   Pulse 81   Temp 98.2 F (36.8 C) (Oral)   Resp 18   SpO2 96%   Visual Acuity Right Eye Distance:   Left Eye Distance:   Bilateral Distance:    Right Eye Near:   Left Eye Near:    Bilateral Near:     Physical Exam Vitals reviewed.  Constitutional:      General: She is awake. She is not in acute distress.    Appearance: Normal appearance. She is well-developed. She is not ill-appearing.     Comments: Very pleasant female appear stated age in no acute distress sitting comfortably in exam room  HENT:     Head: Normocephalic and atraumatic.     Right Ear: Tympanic membrane, ear canal and external ear normal. Tympanic membrane is not erythematous or bulging.     Left Ear: Tympanic membrane, ear canal and external ear normal. Tympanic membrane is not erythematous or  bulging.     Nose:     Right Sinus: No maxillary sinus tenderness or frontal sinus tenderness.     Left Sinus: No maxillary sinus tenderness or frontal sinus tenderness.     Mouth/Throat:      Pharynx: Uvula midline. Postnasal drip present. No oropharyngeal exudate or posterior oropharyngeal erythema.     Tonsils: 0 on the right. 0 on the left.  Cardiovascular:     Rate and Rhythm: Normal rate and regular rhythm.     Heart sounds: Normal heart sounds, S1 normal and S2 normal. No murmur heard. Pulmonary:     Effort: Pulmonary effort is normal.     Breath sounds: Normal breath sounds. No wheezing, rhonchi or rales.     Comments: Clear to auscultation bilaterally Lymphadenopathy:     Head:     Right side of head: Submandibular adenopathy present. No submental or tonsillar adenopathy.     Left side of head: Submandibular adenopathy present. No submental or tonsillar adenopathy.     Cervical: No cervical adenopathy.     Right cervical: No superficial or deep cervical adenopathy.    Left cervical: No superficial or deep cervical adenopathy.     Upper Body:     Right upper body: No supraclavicular adenopathy.     Left upper body: No supraclavicular adenopathy.     Comments: Enlarged, mobile, mildly tender submandibular adenopathy noted bilaterally but slightly increased on left measuring approximately 1.5 x 1.5 cm compared to right which measures approximately 1 x 1 cm  Psychiatric:        Behavior: Behavior is cooperative.      UC Treatments / Results  Labs (all labs ordered are listed, but only abnormal results are displayed) Labs Reviewed  POCT RAPID STREP A (OFFICE) - Normal  POCT MONO SCREEN ([facility]) - Normal    EKG   Radiology No results found.  Procedures Procedures (including critical care time)  Medications Ordered in UC Medications - No data to display  Initial Impression / Assessment and Plan / UC Course  I have reviewed the triage vital signs and the nursing notes.  Pertinent labs & imaging results that were available during my care of the patient were reviewed by me and considered in my medical decision making (see chart for details).     Patient is  well-appearing, afebrile, nontoxic, nontachycardic.  No alarm symptoms that warrant emergent evaluation or imaging.  Strep testing was negative.  Mono testing was also negative.  We did discuss potential utility of basic blood work, however, patient had a CBC drawn earlier today with her PCP and given likely low yield of metabolic panel and other testing will defer additional blood work for the time being.  We discussed that lymph nodes in this area tend to be reactive from there is a head or neck infection so we will start Augmentin twice daily for 7 days.  She was encouraged to use Tylenol ibuprofen as well as gargling with warm salt water.  Recommend close follow-up with her primary care as if her symptoms do not resolve quickly she would need to have additional blood work and potentially imaging that we do not have the ability to arrange an urgent care.  We discussed that if anything worsens and she has sudden swelling of lymph node, difficulty swallowing, swelling of her throat, muffled voice, shortness of breath, nausea/vomiting she needs to be seen immediately.  Strict return precautions given.  All questions answered to patient satisfaction.  Final Clinical Impressions(s) / UC Diagnoses   Final diagnoses:  Lymphadenopathy  Sore throat     Discharge Instructions      Your strep and mono were negative.  You already have a CBC pending with your primary care and they will contact you if this is abnormal.  As we discussed, usually swollen lymph nodes are related to an infection so we are going to start Augmentin twice daily for 7 days.  Gargle with warm salt water and use Tylenol ibuprofen as needed.  If your symptoms are not improving within a week you should follow-up with your primary care as you may need additional testing including blood work and/or imaging.  If anything worsens you have sudden increase in size of the lymph node, increased pain, fever, night sweats, weakness, body aches,  difficulty swallowing you need to be seen immediately.     ED Prescriptions     Medication Sig Dispense Auth. Provider   amoxicillin-clavulanate (AUGMENTIN) 875-125 MG tablet Take 1 tablet by mouth every 12 (twelve) hours. 14 tablet [place] Welliver, Noberto Retort, PA-C      PDMP not reviewed this encounter.   Jeani Hawking, PA-C 10/13/23 1719

## 2023-10-14 LAB — CBC WITH DIFFERENTIAL/PLATELET
Basophils Absolute: 0 10*3/uL (ref 0.0–0.2)
Basos: 1 %
EOS (ABSOLUTE): 0.3 10*3/uL (ref 0.0–0.4)
Eos: 3 %
Hematocrit: 42.1 % (ref 34.0–46.6)
Hemoglobin: 14.2 g/dL (ref 11.1–15.9)
Immature Grans (Abs): 0.1 10*3/uL (ref 0.0–0.1)
Immature Granulocytes: 1 %
Lymphocytes Absolute: 2.5 10*3/uL (ref 0.7–3.1)
Lymphs: 30 %
MCH: 30.9 pg (ref 26.6–33.0)
MCHC: 33.7 g/dL (ref 31.5–35.7)
MCV: 92 fL (ref 79–97)
Monocytes Absolute: 0.6 10*3/uL (ref 0.1–0.9)
Monocytes: 7 %
Neutrophils Absolute: 4.9 10*3/uL (ref 1.4–7.0)
Neutrophils: 58 %
Platelets: 291 10*3/uL (ref 150–450)
RBC: 4.59 x10E6/uL (ref 3.77–5.28)
RDW: 12.2 % (ref 11.7–15.4)
WBC: 8.4 10*3/uL (ref 3.4–10.8)

## 2023-10-14 LAB — HEPATITIS C ANTIBODY: Hep C Virus Ab: NONREACTIVE

## 2023-10-14 LAB — VITAMIN D 25 HYDROXY (VIT D DEFICIENCY, FRACTURES): Vit D, 25-Hydroxy: 34.1 ng/mL (ref 30.0–100.0)

## 2023-10-16 ENCOUNTER — Encounter: Payer: Self-pay | Admitting: Family Medicine

## 2023-10-26 ENCOUNTER — Encounter: Payer: Self-pay | Admitting: Family Medicine

## 2023-10-31 ENCOUNTER — Ambulatory Visit: Payer: BC Managed Care – PPO | Admitting: Internal Medicine

## 2023-10-31 ENCOUNTER — Ambulatory Visit: Payer: Self-pay | Admitting: Family Medicine

## 2023-10-31 ENCOUNTER — Encounter: Payer: Self-pay | Admitting: Internal Medicine

## 2023-10-31 VITALS — BP 98/70 | HR 76 | Temp 98.3°F | Ht 67.0 in | Wt 187.0 lb

## 2023-10-31 DIAGNOSIS — R300 Dysuria: Secondary | ICD-10-CM

## 2023-10-31 DIAGNOSIS — N3090 Cystitis, unspecified without hematuria: Secondary | ICD-10-CM | POA: Diagnosis not present

## 2023-10-31 DIAGNOSIS — R82998 Other abnormal findings in urine: Secondary | ICD-10-CM

## 2023-10-31 DIAGNOSIS — R350 Frequency of micturition: Secondary | ICD-10-CM

## 2023-10-31 LAB — POCT URINALYSIS DIP (CLINITEK)
Bilirubin, UA: NEGATIVE
Glucose, UA: 500 mg/dL — AB
Ketones, POC UA: NEGATIVE mg/dL
Nitrite, UA: POSITIVE — AB
Spec Grav, UA: 1.01 (ref 1.010–1.025)
Urobilinogen, UA: 0.2 U/dL
pH, UA: 6 (ref 5.0–8.0)

## 2023-10-31 MED ORDER — CLOTRIMAZOLE-BETAMETHASONE 1-0.05 % EX CREA: TOPICAL_CREAM | CUTANEOUS | 0 refills | Status: AC

## 2023-10-31 MED ORDER — CIPROFLOXACIN HCL 500 MG PO TABS: 500.0000 mg | ORAL_TABLET | Freq: Two times a day (BID) | ORAL | 0 refills | Status: AC

## 2023-10-31 NOTE — Progress Notes (Signed)
Patient Care Team: Melida Quitter, PA as PCP - General (Family Medicine) Christell Constant, MD as PCP - Cardiology (Cardiology) [place], Amy, MD as Consulting Physician (Dermatology) Clarita Leber, MD as Attending Physician (Orthopedic Surgery) Unk Lightning, [place] as Physician Assistant (Gastroenterology) Dorisann Frames, MD as Consulting Physician (Endocrinology) Zelphia Cairo, MD as Consulting Physician (Obstetrics and Gynecology) Fredrich Birks, OD as Referring Physician (Optometry) Mia Creek, MD as Consulting Physician (Ophthalmology) Christell Constant, MD as Consulting Physician (Cardiology)  Visit Date: 10/31/23  Subjective:   Chief Complaint  Patient presents with   Urinary Frequency        Dysuria    Took azo last night and this morning, symptoms started yesterday evening.   Patient ZO:XWRUEAVW Krawiec,Female DOB:[birth year],52 y.o. UJW:[id number]   53 y.o. Female presents today for acute sick visit with Urinary Frequency and Dysuria. Reports that her symptoms began yesterday evening. Endorses severe urinary urgency, with only about 15 seconds to get to the bathroom. Elaborates about her dysuria being a cramping and burning pain towards the end of stream. Notes that she does believe she has had an UTI once several years ago. States she did take an Azo last night and this morning. Does endorse being sexually active, however does not attribute symptoms to recent intercourse. Denies CVA tenderness.   Also mentions that she has been experiencing vaginal pruritus and dryness. She states that she does have psoriasis, which does occasionally occur inguinally, however she also notes that 10/13/23 she did have lymphadenopathy, which was treated with 875-125 mg Amoxicillin x7 days.   Past Medical History:  Diagnosis Date   Acute pancreatitis    Allergy    seasonal    Arthritis    2021 right hip   Cataract    2020   COVID    Diabetes (HCC)     Gallstones    Hyperlipidemia    Hypertension    Pneumonia    Squamous cell carcinoma    skin-sx on 2021   Thyroid disease    2016    Allergies  Allergen Reactions   Januvia [Sitagliptin] Other (See Comments)    Pancreatitis   Onglyza [Saxagliptin] Other (See Comments)    Pancreatitis to all DPP 4 inhibitor medications   Cherry Swelling and Itching   Lidocaine     Other reaction(s): Other (See Comments) Blisters   Peach [Prunus Persica] Other (See Comments)    Facial tingling   Saxagliptin-Metformin Er Other (See Comments)    Family History  Problem Relation Age of Onset   Heart disease Mother    Basal cell carcinoma Mother    Diabetes Father    Hyperlipidemia Father    Hypertension Father    Colon polyps Father 6   Prostate cancer Father 26   Melanoma Father    Aneurysm Father    AAA (abdominal aortic aneurysm) Father 59       Thoracic aorta repair and AVR   Alcohol abuse Sister    Mental illness Sister    Asthma Sister    Urticaria Sister    Allergic rhinitis Sister    Heart attack Paternal Aunt    Melanoma Paternal Aunt    Breast cancer Maternal Grandmother    AAA (abdominal aortic aneurysm) Maternal Grandfather    Diabetes Paternal Grandmother    Stroke Paternal Grandfather    Melanoma Paternal Grandfather    Colon cancer Neg Hx    Esophageal cancer Neg Hx    Rectal cancer  Neg Hx    Stomach cancer Neg Hx    Social History   Social History Narrative   Not on file   Review of Systems  Genitourinary:  Positive for dysuria (burning & cramping towards end of stream), frequency and urgency (15 second warning).       (+) Vaginal Dryness  Skin:  Positive for itching (vaginal).     Objective:  Vitals: BP 98/70   Pulse 76   Temp 98.3 F (36.8 C)   Ht 5\' 7"  (1.702 m)   Wt 187 lb (84.8 kg)   SpO2 98%   BMI 29.29 kg/m   Physical Exam Vitals and nursing note reviewed.  Constitutional:      General: She is not in acute distress.    Appearance: Normal  appearance. She is not toxic-appearing.  HENT:     Head: Normocephalic and atraumatic.  Pulmonary:     Effort: Pulmonary effort is normal.  Abdominal:     Tenderness: There is no right CVA tenderness or left CVA tenderness.  Skin:    General: Skin is warm and dry.  Neurological:     Mental Status: She is alert and oriented to person, place, and time. Mental status is at baseline.  Psychiatric:        Mood and Affect: Mood normal.        Behavior: Behavior normal.        Thought Content: Thought content normal.        Judgment: Judgment normal.     Results:  Studies Obtained And Personally Reviewed By Me: Labs:     Component Value Date/Time   NA 139 08/09/2023 0000   NA 140 12/08/2021 0000   NA 140 12/08/2021 0000   K 4.5 08/09/2023 0000   K 4.9 12/08/2021 0000   K 4.7 12/08/2021 0000   CL 99 08/09/2023 0000   CL 100 12/08/2021 0000   CL 100 12/08/2021 0000   CO2 23 (A) 08/09/2023 0000   CO2 28 12/08/2021 0000   CO2 29 12/08/2021 0000   GLUCOSE 115 (H) 09/08/2020 1342   GLUCOSE 146 (H) 08/18/2020 1022   BUN 22 (A) 08/09/2023 0000   BUN 19 12/08/2021 0000   BUN 22 12/08/2021 0000   CREATININE 0.8 08/09/2023 0000   CREATININE 0.8 12/08/2021 0000   CREATININE 0.84 12/08/2021 0000   CALCIUM 10.6 08/09/2023 0000   CALCIUM 10.2 12/08/2021 0000   CALCIUM 10.4 12/08/2021 0000   PROT 7.8 06/21/2023 1426   ALBUMIN 5.0 08/09/2023 0000   ALBUMIN 5.2 (H) 06/21/2023 1426   ALBUMIN 5.2 12/08/2021 0000   AST 41 (A) 08/09/2023 0000   AST 48 12/08/2021 0000   ALT 75 (A) 08/09/2023 0000   ALT 108 (A) 12/08/2021 0000   ALKPHOS 67 08/09/2023 0000   ALKPHOS 81 12/08/2021 0000   BILITOT 0.3 06/21/2023 1426   GFRNONAA 86 12/08/2021 0000   GFRNONAA 81 12/08/2021 0000   GFRNONAA >60 08/18/2020 1022   GFRNONAA >89 05/20/2016 0815   GFRAA 99 12/08/2021 0000   GFRAA 98 12/08/2021 0000   GFRAA >89 05/20/2016 0815    Lab Results  Component Value Date   WBC 8.4 10/13/2023   HGB  14.2 10/13/2023   HCT 42.1 10/13/2023   MCV 92 10/13/2023   PLT 291 10/13/2023   Lab Results  Component Value Date   CHOL 165 08/09/2023   HDL 39 08/09/2023   LDLCALC 91 08/09/2023   TRIG 202 (A) 08/09/2023  CHOLHDL 3.5 08/01/2022   Lab Results  Component Value Date   HGBA1C 8.1 08/09/2023    Lab Results  Component Value Date   TSH 1.52 08/09/2023    Results for orders placed or performed in visit on 10/31/23  POCT URINALYSIS DIP (CLINITEK)  Result Value Ref Range   Color, UA yellow yellow   Clarity, UA cloudy (A) clear   Glucose, UA =500 (A) negative mg/dL   Bilirubin, UA negative negative   Ketones, POC UA negative negative mg/dL   Spec Grav, UA 1.610 9.604 - 1.025   Blood, UA large (A) negative   pH, UA 6.0 5.0 - 8.0   POC PROTEIN,UA trace negative, trace   Urobilinogen, UA 0.2 0.2 or 1.0 E.U./dL   Nitrite, UA Positive (A) Negative   Leukocytes, UA Trace (A) Negative   Assessment & Plan:   Dysuria; Urinary Frequency; Leukocytic Urine: UA resulted with cloudy urine, large amounts of blood, nitrite pos and slightly leukocytic. Endorses urgency, frequency, and dysuria; denies CVA tenderness. Sending in 500 mg Cipro - take 1 tablet (500 mg total) by mouth 2 (two) times daily for 3 day. Also endorsed some vaginal pruritus, sending in Lotrisone cream - apply to genital area twice daily if needed for external itching. If symptoms don't improve or they worsen despite treatment, let us know.     I,Emily Lagle,acting as a Neurosurgeon for Margaree Mackintosh, MD.,have documented all relevant documentation on the behalf of Margaree Mackintosh, MD,as directed by  Margaree Mackintosh, MD while in the presence of Margaree Mackintosh, MD.   ***

## 2023-10-31 NOTE — Telephone Encounter (Signed)
Called patient regarding prescription request: no answer left message: will attempt to reach back out to patient for triage.

## 2023-10-31 NOTE — Telephone Encounter (Signed)
Copied from CRM [id number]. Topic: Clinical - Medication Refill >> Oct 31, 2023  8:12 AM Hector Shade B wrote: Most Recent Primary Care Visit:  Provider: [facility]  Department: [facility]  Visit Type: LAB VISIT  Date: 10/13/2023  Medication: Medication for UTI;   Has the patient contacted their pharmacy? No (Agent: If no, request that the patient contact the pharmacy for the refill. If patient does not wish to contact the pharmacy document the reason why and proceed with request.) (Agent: If yes, when and what did the pharmacy advise?)  Is this the correct pharmacy for this prescription? Yes If no, delete pharmacy and type the correct one.  This is the patient's preferred pharmacy:  [facility] [id number] [place], [place] - [address] [place] [address] Phone: [phone] Fax: [phone]   Has the prescription been filled recently? No  Is the patient out of the medication? No Patient   Has the patient been seen for an appointment in the last year OR does the patient have an upcoming appointment? Yes  Can we respond through MyChart? Yes  Agent: Please be advised that Rx refills may take up to 3 business days. We ask that you follow-up with your pharmacy.

## 2023-10-31 NOTE — Telephone Encounter (Signed)
Copied from CRM [id number]. Topic: Clinical - Medication Refill >> Oct 31, 2023  8:12 AM Hector Shade B wrote: Most Recent Primary Care Visit:  Provider: [facility]  Department: [facility]  Visit Type: LAB VISIT  Date: 10/13/2023  Medication: Medication for UTI;   Has the patient contacted their pharmacy? No (Agent: If no, request that the patient contact the pharmacy for the refill. If patient does not wish to contact the pharmacy document the reason why and proceed with request.) (Agent: If yes, when and what did the pharmacy advise?)  Is this the correct pharmacy for this prescription? Yes If no, delete pharmacy and type the correct one.  This is the patient's preferred pharmacy:  [facility] [phone] Ginette Otto, [place] - [address] [place] [address] Phone: [phone] Fax: [phone]   Has the prescription been filled recently? No  Is the patient out of the medication? No Patient   Has the patient been seen for an appointment in the last year OR does the patient have an upcoming appointment? Yes  Can we respond through MyChart? Yes  Agent: Please be advised that Rx refills may take up to 3 business days. We ask that you follow-up with your pharmacy.   Chief Complaint: Pain with urination Symptoms: pain and frequency Frequency: constant Pertinent Negatives: Patient denies fever or flank pain Disposition: [] ED /[] Urgent Care (no appt availability in office) / [x] Appointment(In office/virtual)/ []  [facility]/ [] Home Care/ [] Refused Recommended Disposition /[] [employer] [facility]/ []  Follow-up with PCP Additional Notes: Pt requesting appt for today, no avail appts at [facility], pt schedule today at [facility] at 3pm  Reason for Disposition  Urinating more frequently than usual (i.e., frequency)  Answer Assessment - Initial Assessment Questions 1. SYMPTOM: "What's the main symptom you're concerned about?" (e.g.,  frequency, incontinence)     Cramping pain with urination  2. ONSET: "When did the  pain  start?"     2 days ago  3. PAIN: "Is there any pain?" If Yes, ask: "How bad is it?" (Scale: 1-10; mild, moderate, severe)     Mild to moderate  4. CAUSE: "What do you think is causing the symptoms?"     Urinary tract infections  5. OTHER SYMPTOMS: "Do you have any other symptoms?" (e.g., blood in urine, fever, flank pain, pain with urination)     Urinary frequency  6. PREGNANCY: "Is there any chance you are pregnant?" "When was your last menstrual period?"     No  Protocols used: Urinary Symptoms-A-AH

## 2023-11-01 NOTE — Patient Instructions (Addendum)
It was a pleasure to see you today. Please take cipro twice daily for 3 days pending urine culture results. May use Lotrisone cream as needed for genital itching.

## 2023-11-02 LAB — URINE CULTURE
MICRO NUMBER:: 16069697
SPECIMEN QUALITY:: ADEQUATE

## 2023-12-14 DIAGNOSIS — E7800 Pure hypercholesterolemia, unspecified: Secondary | ICD-10-CM | POA: Diagnosis not present

## 2023-12-14 DIAGNOSIS — E119 Type 2 diabetes mellitus without complications: Secondary | ICD-10-CM | POA: Diagnosis not present

## 2023-12-14 DIAGNOSIS — E039 Hypothyroidism, unspecified: Secondary | ICD-10-CM | POA: Diagnosis not present

## 2023-12-21 DIAGNOSIS — R945 Abnormal results of liver function studies: Secondary | ICD-10-CM | POA: Diagnosis not present

## 2023-12-21 DIAGNOSIS — E039 Hypothyroidism, unspecified: Secondary | ICD-10-CM | POA: Diagnosis not present

## 2023-12-21 DIAGNOSIS — I10 Essential (primary) hypertension: Secondary | ICD-10-CM | POA: Diagnosis not present

## 2023-12-21 DIAGNOSIS — E119 Type 2 diabetes mellitus without complications: Secondary | ICD-10-CM | POA: Diagnosis not present

## 2024-02-26 ENCOUNTER — Telehealth: Payer: Self-pay | Admitting: Internal Medicine

## 2024-02-26 NOTE — Telephone Encounter (Signed)
 Pt c/o of Chest Pain: STAT if active CP, including tightness, pressure, jaw pain, radiating pain to shoulder/upper arm/back, CP unrelieved by Nitro. Symptoms reported of SOB, nausea, vomiting, sweating.  1. Are you having CP right now? No    2. Are you experiencing any other symptoms (ex. SOB, nausea, vomiting, sweating)? Just gets nervous, pulse going a little "wacky" at times    3. Is your CP continuous or coming and going? Coming and going    4. Have you taken Nitroglycerin ? No    5. How long have you been experiencing CP? About a month     6. If NO CP at time of call then end call with telling Pt to call back or call 911 if Chest pain returns prior to return call from triage team.   Patient states her husband recently had a calcium  score that came back as zero. She reports her husband looks like a "walking heart attack" so it caused her to wonder what her is. She saw on mychart that hers was 900 and states that was never addressed with her, so she is unsure if this could be the cause of the CP due to it making her nervous. Visit was scheduled for 06/26 prior to pt reporting symptoms. Please advise.

## 2024-02-26 NOTE — Telephone Encounter (Signed)
 Left message for patient to call back

## 2024-02-29 NOTE — Telephone Encounter (Signed)
 Called pt to f/u c/o CP.  Pt reports did not need assistance with this reports called to schedule an OV with MD.  Pt is scheduled to see MD 03/14/24.  Reports had 5-7 days with CP in the middle of the night or at random times. Has not had any CP in the at least a week.  Has notimmediate needs at this time.   Pt reports BP and HR are higher than normal for her but not terrible 140/90-90's.  Advised pt to call back if has further concerns or go to ED for evaluation.

## 2024-03-14 ENCOUNTER — Encounter: Payer: Self-pay | Admitting: Internal Medicine

## 2024-03-14 ENCOUNTER — Ambulatory Visit: Attending: Internal Medicine | Admitting: Internal Medicine

## 2024-03-14 VITALS — BP 118/77 | HR 74 | Ht 67.0 in | Wt 191.0 lb

## 2024-03-14 DIAGNOSIS — E1169 Type 2 diabetes mellitus with other specified complication: Secondary | ICD-10-CM | POA: Diagnosis not present

## 2024-03-14 DIAGNOSIS — E1159 Type 2 diabetes mellitus with other circulatory complications: Secondary | ICD-10-CM | POA: Diagnosis not present

## 2024-03-14 DIAGNOSIS — Z8249 Family history of ischemic heart disease and other diseases of the circulatory system: Secondary | ICD-10-CM

## 2024-03-14 DIAGNOSIS — E785 Hyperlipidemia, unspecified: Secondary | ICD-10-CM

## 2024-03-14 DIAGNOSIS — I2510 Atherosclerotic heart disease of native coronary artery without angina pectoris: Secondary | ICD-10-CM

## 2024-03-14 DIAGNOSIS — I152 Hypertension secondary to endocrine disorders: Secondary | ICD-10-CM

## 2024-03-14 DIAGNOSIS — R0789 Other chest pain: Secondary | ICD-10-CM

## 2024-03-14 MED ORDER — EZETIMIBE 10 MG PO TABS: 10.0000 mg | ORAL_TABLET | Freq: Every day | ORAL | 3 refills | Status: AC

## 2024-03-14 NOTE — Progress Notes (Signed)
 Cardiology Office Note:  .    Date:  03/14/2024  ID:  Jessica Chang, DOB 1970-11-07, MRN [id number] PCP: Jessica Chang  [facility] Providers Cardiologist:  Jessica DELENA Leavens, MD     CC: CAD f/u   History of Present Illness: Jessica Chang is a 52 y.o. female with chronic hypertension, diabetes, and coronary artery disease who presents for a two-year follow-up visit.  She has a history of chronic hypertension, diabetes, and mild obstructive coronary artery disease. Her current medication regimen includes hydrochlorothiazide , an ACE inhibitor, and aspirin . She has been focusing on aggressive secondary prevention since 2021.  She experienced a recent episode of atypical chest pain, described as a burning sensation between her shoulder blades, about a month ago. She has not been as physically active due to hip and back pain, which she attributes to potential back issues. She is managing the pain conservatively without surgical interventions.  Recent lab work from [facility] showed an elevated blood sugar with an A1c of 8%, and elevated LDL cholesterol at 94 mg/dL, with triglycerides at 202 mg/dL. She has been working on improving her blood sugar levels, noting a current A1c of 7.1%.  She has a family history of thoracic aortic and abdominal aortic aneurysm, but her aortic measurements have been normal. She reports occasional shortness of breath, which occurs sporadically, such as when standing in the clinic.   Relevant histories: .  Social  - brother has CAC score of 25, husband zero calcium  score - works at [employer] - 2021: established Care - 2022: non obstructive CAD - 2023: no aortopathy noted (fhx of aortic aneurysm without dissection) ROS: As per HPI.   Studies Reviewed: .     Cardiac Studies & Procedures   ______________________________________________________________________________________________      ECHOCARDIOGRAM  ECHOCARDIOGRAM COMPLETE 07/06/2023  Narrative ECHOCARDIOGRAM REPORT    Patient Name:   Jessica Chang Date of Exam: 07/06/2023 Medical Rec #:  [id number]      Height:       67.0 in Accession #:    [id number]     Weight:       187.0 lb Date of Birth:  August 02, 1971     BSA:          1.965 m Patient Age:    51 years       BP:           116/76 mmHg Patient Gender: F              HR:           87 bpm. Exam Location:  [address]  Procedure: 2D Echo, Cardiac Doppler, Color Doppler, 3D Echo and Strain Analysis  Indications:    I25.10 CAD I77.819 Dilated aorta  History:        Patient has prior history of Echocardiogram examinations, most recent 02/02/2022. CAD, Dilated aorta; Risk Factors:Hypertension, Diabetes and Dyslipidemia.  Sonographer:    Elsie Bohr RDCS Referring Phys: [id number] KATLYN D WEST  IMPRESSIONS   1. Left ventricular ejection fraction, by estimation, is 60 to 65%. The left ventricle has normal function. The left ventricle has no regional wall motion abnormalities. Left ventricular diastolic parameters were normal. The average left ventricular global longitudinal strain is 18.4 %. The global longitudinal strain is normal. 2. Right ventricular systolic function is normal. The right ventricular size is normal. Tricuspid regurgitation signal is inadequate for assessing PA pressure. 3. The mitral valve is normal in  structure. No evidence of mitral valve regurgitation. No evidence of mitral stenosis. 4. The aortic valve is tricuspid. Aortic valve regurgitation is not visualized. No aortic stenosis is present. 5. Aortic dilatation noted. There is mild dilatation of the aortic root, measuring 38 mm. There is mild dilatation of the ascending aorta, measuring 38 mm. 6. The inferior vena cava is normal in size with greater than 50% respiratory variability, suggesting right atrial pressure of 3 mmHg.  FINDINGS Left Ventricle: Left ventricular ejection  fraction, by estimation, is 60 to 65%. The left ventricle has normal function. The left ventricle has no regional wall motion abnormalities. The average left ventricular global longitudinal strain is 18.4 %. The global longitudinal strain is normal. The left ventricular internal cavity size was normal in size. There is no left ventricular hypertrophy. Left ventricular diastolic parameters were normal.  Right Ventricle: The right ventricular size is normal. No increase in right ventricular wall thickness. Right ventricular systolic function is normal. Tricuspid regurgitation signal is inadequate for assessing PA pressure.  Left Atrium: Left atrial size was normal in size.  Right Atrium: Right atrial size was normal in size.  Pericardium: There is no evidence of pericardial effusion.  Mitral Valve: The mitral valve is normal in structure. No evidence of mitral valve regurgitation. No evidence of mitral valve stenosis.  Tricuspid Valve: The tricuspid valve is normal in structure. Tricuspid valve regurgitation is trivial.  Aortic Valve: The aortic valve is tricuspid. Aortic valve regurgitation is not visualized. No aortic stenosis is present.  Pulmonic Valve: The pulmonic valve was normal in structure. Pulmonic valve regurgitation is not visualized.  Aorta: Aortic dilatation noted. There is mild dilatation of the aortic root, measuring 38 mm. There is mild dilatation of the ascending aorta, measuring 38 mm.  Venous: The inferior vena cava is normal in size with greater than 50% respiratory variability, suggesting right atrial pressure of 3 mmHg.  IAS/Shunts: No atrial level shunt detected by color flow Doppler.   LEFT VENTRICLE PLAX 2D LVIDd:         4.60 cm   Diastology LVIDs:         2.60 cm   LV e' medial:    9.14 cm/s LV PW:         1.60 cm   LV E/e' medial:  8.6 LV IVS:        1.10 cm   LV e' lateral:   14.20 cm/s LVOT diam:     2.30 cm   LV E/e' lateral: 5.5 LV SV:         85 LV  SV Index:   43        2D Longitudinal Strain LVOT Area:     4.15 cm  2D Strain GLS (A2C):   20.2 % 2D Strain GLS (A3C):   16.5 % 2D Strain GLS (A4C):   18.6 % 2D Strain GLS Avg:     18.4 %  3D Volume EF: 3D EF:        61 % LV EDV:       130 ml LV ESV:       51 ml LV SV:        79 ml  RIGHT VENTRICLE             IVC RV S prime:     20.50 cm/s  IVC diam: 1.30 cm TAPSE (M-mode): 2.3 cm  LEFT ATRIUM             Index  RIGHT ATRIUM           Index LA diam:        4.20 cm 2.14 cm/m   RA Pressure: 3.00 mmHg LA Vol (A2C):   44.8 ml 22.80 ml/m  RA Area:     12.90 cm LA Vol (A4C):   52.2 ml 26.56 ml/m  RA Volume:   26.20 ml  13.33 ml/m LA Biplane Vol: 50.1 ml 25.49 ml/m AORTIC VALVE LVOT Vmax:   109.00 cm/s LVOT Vmean:  70.400 cm/s LVOT VTI:    0.204 m  AORTA Ao Root diam: 3.80 cm Ao Asc diam:  3.80 cm  MITRAL VALVE               TRICUSPID VALVE MV Area (PHT): 3.20 cm    Estimated RAP:  3.00 mmHg MV Decel Time: 237 msec MV E velocity: 78.60 cm/s  SHUNTS MV A velocity: 69.20 cm/s  Systemic VTI:  0.20 m MV E/A ratio:  1.14        Systemic Diam: 2.30 cm  Dalton McleanMD Electronically signed by Ezra Kanner Signature Date/Time: 07/06/2023/4:14:10 PM    Final      CT SCANS  CT CORONARY FRACTIONAL FLOW RESERVE DATA PREP 09/21/2020  Narrative CLINICAL DATA:  Chest pain  EXAM: CT FFR  MEDICATIONS: No additional medications.  TECHNIQUE: The coronary CTA was sent for FFR  FINDINGS: No modeled stenoses > 30%.  IMPRESSION: No evidence for hemodynamically significant coronary disease.  Dalton Mclean   Electronically Signed By: Ezra Shuck M.D. On: 09/22/2020 17:31   CT CORONARY MORPH W/CTA COR W/SCORE 09/21/2020  Addendum 09/21/2020  5:25 PM ADDENDUM REPORT: 09/21/2020 17:22  CLINICAL DATA:  Chest pain  EXAM: Cardiac CTA  MEDICATIONS: Sub lingual nitro. 4mg  x 2  TECHNIQUE: The patient was scanned on a Siemens 192 slice scanner.  Gantry rotation speed was 250 msecs. Collimation was 0.6 mm. A 100 kV prospective scan was triggered in the ascending thoracic aorta at 35-75% of the R-R interval. Average HR during the scan was 70 bpm. The 3D data set was interpreted on a dedicated work station using MPR, MIP and VRT modes. A total of 80cc of contrast was used.  FINDINGS: Non-cardiac: See separate report from [facility].  Pulmonary veins drain normally to the left atrium. No evidence for LA appendage thrombus.  Calcium  Score: 971 Agatston units.  Coronary Arteries: Right dominant with no anomalies  LM: No plaque or stenosis.  LAD system: Mixed plaque proximal and mid LAD, mild (<50%) stenosis.  Circumflex system: Mixed plaque proximal LCx, mild (<50%) stenosis.  RCA system: Mixed plaque noted in the proximal, mid, and distal RCA. Stenosis appears mild (<50%).  IMPRESSION: 1.Coronary artery calcium  score 971 Agatston units. This places the patient in the 99th percentile for age and gender, suggesting high risk for future cardiac events.  2. Extensive, age-advanced coronary plaque, but appears nonobstructive. Will confirm with FFR.  Dalton Mclean   Electronically Signed By: Ezra Shuck M.D. On: 09/21/2020 17:22  Narrative EXAM: OVER-READ INTERPRETATION  CT CHEST  The following report is an over-read performed by radiologist Dr. Toribio Aye of [facility] on 09/21/2020. This over-read does not include interpretation of cardiac or coronary anatomy or pathology. The coronary calcium  score/coronary CTA interpretation by the cardiologist is attached.  COMPARISON:  None.  FINDINGS: Within the visualized portions of the thorax there are no suspicious appearing pulmonary nodules or masses, there is no acute consolidative airspace disease, no pleural  effusions, no pneumothorax and no lymphadenopathy. Visualized portions of the upper abdomen are unremarkable. There are no  aggressive appearing lytic or blastic lesions noted in the visualized portions of the skeleton.  IMPRESSION: 1. No significant incidental noncardiac findings are noted.  Electronically Signed: By: Toribio Aye M.D. On: 09/21/2020 15:01     ______________________________________________________________________________________________       Physical Exam:    VS:  BP 118/77 (BP Location: Left Arm)   Pulse 74   Ht 5' 7 (1.702 m)   Wt 191 lb (86.6 kg)   SpO2 95%   BMI 29.91 kg/m    Wt Readings from Last 3 Encounters:  03/14/24 191 lb (86.6 kg)  10/31/23 187 lb (84.8 kg)  10/04/23 187 lb 12 oz (85.2 kg)    Gen: no distress   Neck: No JVD Cardiac: No Rubs or Gallops, no Murmur, RRR +2 radial pulses Respiratory: Clear to auscultation bilaterally, normal effort, normal  respiratory rate GI: Soft, nontender, non-distended  MS: No  edema;  moves all extremities Integument: Skin feels warm Neuro:  At time of evaluation, alert and oriented to person/place/time/situation  Psych: Normal affect, patient feels ok   ASSESSMENT AND PLAN: .    Nonobstructive coronary artery disease Atypical chest pain Blood pressure is well-controlled with current medications. Reports atypical chest pain, but currently asymptomatic. Importance of aggressive secondary prevention discussed. Option of a stress test if symptoms worsen discussed (offered Exercise NM Stress test if worsening sx would proceed) - Continue current antihypertensive regimen. - Continue aspirin  for secondary prevention. - Encourage increased physical activity. - If symptoms worsen, perform a nuclear medicine stress test.  Hypertension Hypertension is well-controlled with current medication regimen including hydrochlorothiazide , ACE inhibitor, and aspirin . - Continue current antihypertensive regimen.  Hyperlipidemia LDL above goal at 91, worsened to 94. Triglycerides elevated at 202. Need for aggressive lipid management  discussed. Ezetimibe is not a statin and may help achieve LDL goal under 70. PCSK9 inhibitors like Repatha or Praluent may be considered if LDL goals are not met, aiming for LDL under 55. - Start ezetimibe (Zetia) 10 mg PO daily. - Recheck lipid panel in 3 months. - If LDL goals are not met, consider PCSK9 inhibitors such as Repatha or Praluent; would not due Bempedoic acid given family history of aortopathy - discussed returning to the pool and swimming  Type 2 diabetes mellitus with hyperglycemia A1c at 8, indicating poor control. Reports improvement in blood sugar control with current A1c at 7.1. Challenges with maintaining blood sugar control include dietary habits and pain management affecting glucose levels with steroid shots - Continue current diabetes management plan.  Back and hip pain Chronic back and hip pain affecting physical activity levels. Ongoing struggle to determine the exact cause, likely related to back issues. No current plans for surgical intervention. Importance of finding an exercise regimen that accommodates orthopedic issues discussed. - Encouraged low-impact exercises such as swimming and pilates.  Longitudinal care: The evaluation and management services provided today reflect the complexity inherent in caring for this patient, including the ongoing longitudinal relationship and management of multiple chronic conditions and/or the need for care coordination. The visit required a comprehensive assessment and management plan tailored to the patient's unique needs Time was spent addressing not only the acute concerns but also the broader context of the patient's health, including preventive care, chronic disease management, and care coordination as appropriate.  Complex longitudinal is necessary for conditions including: elevated calcium  score at young age requiring aggressive  secondary prevention.  Will have her check in with Kaitlyn West NP in one year Two years with  me   Jessica Leavens, MD FASE [facility] Cardiologist [facility]  [address] [place], [address] [place], [place] [address] [phone]  9:33 AM

## 2024-03-14 NOTE — Patient Instructions (Signed)
 Medication Instructions:  Your physician has recommended you make the following change in your medication:  START: ezetimibe (Zetia) 10 mg by mouth once daily   *If you need a refill on your cardiac medications before your next appointment, please call your pharmacy*  Lab Work: IN 3 MONTHS at any [facility]: fasting lipid panel and ALT  If you have labs (blood work) drawn today and your tests are completely normal, you will receive your results only by: MyChart Message (if you have MyChart) OR A paper copy in the mail If you have any lab test that is abnormal or we need to change your treatment, we will call you to review the results.  Testing/Procedures: NONE  Follow-Up: At [facility], you and your health needs are our priority.  As part of our continuing mission to provide you with exceptional heart care, our providers are all part of one team.  This team includes your primary Cardiologist (physician) and Advanced Practice Providers or APPs (Physician Assistants and Nurse Practitioners) who all work together to provide you with the care you need, when you need it.  Your next appointment:   1 year(s)  Provider:   Katlyn West, NP      Then, Stanly DELENA Leavens, MD will plan to see you again in 2 year(s).

## 2024-04-02 ENCOUNTER — Ambulatory Visit: Payer: BC Managed Care – PPO | Admitting: Family Medicine

## 2024-04-25 ENCOUNTER — Ambulatory Visit

## 2024-04-25 VITALS — BP 123/82 | HR 79 | Temp 97.4°F | Ht 67.0 in | Wt 189.0 lb

## 2024-04-25 DIAGNOSIS — F411 Generalized anxiety disorder: Secondary | ICD-10-CM | POA: Diagnosis not present

## 2024-04-25 DIAGNOSIS — E663 Overweight: Secondary | ICD-10-CM | POA: Diagnosis not present

## 2024-04-25 NOTE — Progress Notes (Signed)
   Established Patient Office Visit  Subjective   Patient ID: Jessica Chang, female    DOB: 07-Jan-1971  Age: 53 y.o. MRN: [id number]  Chief Complaint  Patient presents with   Medical Management of Chronic Issues    HPI  History of Present Illness Jessica Chang is a 53 year old female who presents for follow-up on mood and weight management.  Generalized Anxiety: - Mood stabilization with fluoxetine  40 mg daily, effective and well-tolerated - Mood symptoms attributed to menopause - No current concerns regarding mood control - No need for medication refill at this time  Weight gain and physical activity limitation - Significant weight gain of 12 pounds within two weeks after discontinuing metformin last year - No changes in diet or activity level during this period - Persistent inability to lose weight gained since stopping metformin by endocrinologist - Decreased physical activity due to pain in her right leg  Paresthesia and limb asymmetry - Numbness and tingling in right arm and leg - Right calf smaller than left - Planned cervical MRI to evaluate symptoms with Dr. Bonner      ROS Per HPI.    Objective:     BP 123/82   Pulse 79   Temp (!) 97.4 F (36.3 C) (Oral)   Ht 5' 7 (1.702 m)   Wt 189 lb 0.6 oz (85.7 kg)   SpO2 97%   BMI 29.61 kg/m    Physical Exam Constitutional:      General: She is not in acute distress.    Appearance: Normal appearance.  Cardiovascular:     Rate and Rhythm: Normal rate and regular rhythm.     Heart sounds: Normal heart sounds. No murmur heard.    No friction rub. No gallop.  Pulmonary:     Effort: Pulmonary effort is normal. No respiratory distress.     Breath sounds: Normal breath sounds.  Musculoskeletal:        General: No swelling.  Skin:    General: Skin is warm and dry.  Neurological:     General: No focal deficit present.     Mental Status: She is alert.  Psychiatric:        Mood and Affect: Mood normal.         Behavior: Behavior normal.        Thought Content: Thought content normal.    No results found for any visits on 04/25/24.    The 10-year ASCVD risk score (Arnett DK, et al., 2019) is: 4.1%* (Cholesterol units were assumed)    Assessment & Plan:   Overweight (BMI 25.0-29.9) Assessment & Plan: Obesity with 12-pound weight gain post-metformin discontinuation. Pancreatitis limits GLP-1 use. Considering Optavia program. - Consider restarting Optavia for weight loss. - Avoid GLP-1 medications due to pancreatitis.    GAD (generalized anxiety disorder) Assessment & Plan: Depression well-controlled on fluoxetine  40 mg daily, likely related to menopause. - Continue fluoxetine  40 mg daily.     Return in about 6 months (around 10/26/2024) for Physical.    Jessica JULIANNA Sacks, PA-C

## 2024-04-25 NOTE — Assessment & Plan Note (Signed)
 Depression well-controlled on fluoxetine  40 mg daily, likely related to menopause. - Continue fluoxetine  40 mg daily.

## 2024-04-25 NOTE — Patient Instructions (Signed)
 VISIT SUMMARY: Today, we discussed your mood stabilization, weight management, numbness in your right arm and leg, and diabetes management. Your mood is stable with your current medication, and we are awaiting further tests to understand the numbness in your limbs.  YOUR PLAN: MOOD DISTURBANCE: Your mood is stable with fluoxetine  40 mg daily, which you are tolerating well. -Continue taking fluoxetine  40 mg daily.  WEIGHT GAIN AND PHYSICAL ACTIVITY LIMITATION: You have experienced significant weight gain after stopping metformin and have decreased physical activity due to a leg issue. -Consider restarting the Optavia program for weight loss. -Avoid GLP-1 medications due to your history of pancreatitis. -We can discuss other options for weight loss after the decisions are made from your MRI  I will see you back in 6 months for your physical!  If you have any problems before your next visit feel free to message me via MyChart (minor issues or questions) or call the office, otherwise you may reach out to schedule an office visit.  Thank you! Saddie Sacks, PA-C

## 2024-04-25 NOTE — Assessment & Plan Note (Signed)
 Obesity with 12-pound weight gain post-metformin discontinuation. Pancreatitis limits GLP-1 use. Considering Optavia program. - Consider restarting Optavia for weight loss. - Avoid GLP-1 medications due to pancreatitis.

## 2024-05-02 DIAGNOSIS — M5459 Other low back pain: Secondary | ICD-10-CM | POA: Diagnosis not present

## 2024-05-02 DIAGNOSIS — M47812 Spondylosis without myelopathy or radiculopathy, cervical region: Secondary | ICD-10-CM | POA: Diagnosis not present

## 2024-06-20 DIAGNOSIS — E119 Type 2 diabetes mellitus without complications: Secondary | ICD-10-CM | POA: Diagnosis not present

## 2024-06-20 DIAGNOSIS — E7800 Pure hypercholesterolemia, unspecified: Secondary | ICD-10-CM | POA: Diagnosis not present

## 2024-06-20 DIAGNOSIS — E039 Hypothyroidism, unspecified: Secondary | ICD-10-CM | POA: Diagnosis not present

## 2024-06-27 DIAGNOSIS — I10 Essential (primary) hypertension: Secondary | ICD-10-CM | POA: Diagnosis not present

## 2024-06-27 DIAGNOSIS — E228 Other hyperfunction of pituitary gland: Secondary | ICD-10-CM | POA: Diagnosis not present

## 2024-06-27 DIAGNOSIS — E119 Type 2 diabetes mellitus without complications: Secondary | ICD-10-CM | POA: Diagnosis not present

## 2024-06-27 DIAGNOSIS — E039 Hypothyroidism, unspecified: Secondary | ICD-10-CM | POA: Diagnosis not present

## 2024-06-27 DIAGNOSIS — Z23 Encounter for immunization: Secondary | ICD-10-CM | POA: Diagnosis not present

## 2024-07-01 ENCOUNTER — Ambulatory Visit: Payer: Self-pay

## 2024-07-01 DIAGNOSIS — E1169 Type 2 diabetes mellitus with other specified complication: Secondary | ICD-10-CM

## 2024-07-01 DIAGNOSIS — I2510 Atherosclerotic heart disease of native coronary artery without angina pectoris: Secondary | ICD-10-CM

## 2024-07-05 NOTE — Progress Notes (Signed)
 Jessica Chang                                          MRN: [id number]   07/05/2024   The [facility]BCI Quality Team Specialist reviewed this patient medical record for the purposes of chart review for care gap closure. The following were reviewed: chart review for care gap closure-glycemic status assessment.    VBCI Quality Team

## 2024-07-29 NOTE — Progress Notes (Signed)
   07/29/2024  Patient ID: Jessica Chang, female   DOB: 06/25/1971, 53 y.o.   MRN: [id number]  Pharmacy Quality Measure Review  This patient is appearing on a report for being at risk of failing the Glycemic Status Assessment in Diabetes measure this calendar year.   Last documented A1c 7.4 from 06/20/2024 collection date. Gap closed, updated eGFR noted on 06/20/2024 note as well. Each through Dr Tommas.   Lang Sieve, PharmD, BCGP Clinical Pharmacist  [phone]

## 2024-08-01 ENCOUNTER — Ambulatory Visit: Admitting: Neurology

## 2024-08-01 ENCOUNTER — Encounter: Payer: Self-pay | Admitting: Neurology

## 2024-08-01 VITALS — BP 125/74 | HR 79 | Ht 67.0 in | Wt 191.6 lb

## 2024-08-01 DIAGNOSIS — E66811 Obesity, class 1: Secondary | ICD-10-CM

## 2024-08-01 DIAGNOSIS — R7309 Other abnormal glucose: Secondary | ICD-10-CM

## 2024-08-01 DIAGNOSIS — R0683 Snoring: Secondary | ICD-10-CM

## 2024-08-01 DIAGNOSIS — R635 Abnormal weight gain: Secondary | ICD-10-CM

## 2024-08-01 DIAGNOSIS — R739 Hyperglycemia, unspecified: Secondary | ICD-10-CM

## 2024-08-01 DIAGNOSIS — G4719 Other hypersomnia: Secondary | ICD-10-CM | POA: Diagnosis not present

## 2024-08-01 DIAGNOSIS — Z9189 Other specified personal risk factors, not elsewhere classified: Secondary | ICD-10-CM | POA: Diagnosis not present

## 2024-08-01 DIAGNOSIS — Z820 Family history of epilepsy and other diseases of the nervous system: Secondary | ICD-10-CM

## 2024-08-01 DIAGNOSIS — G4763 Sleep related bruxism: Secondary | ICD-10-CM

## 2024-08-01 DIAGNOSIS — R351 Nocturia: Secondary | ICD-10-CM

## 2024-08-01 NOTE — Progress Notes (Signed)
 Subjective:    Patient ID: Jessica Chang is a 53 y.o. female.  HPI    True Mar, MD, PhD [facility] [address], [address] [address] [place], [place] [address]  Dear Dr. Tommas,   I saw your patient, Jessica Chang, upon your kind request in my sleep clinic today for initial consultation of her sleep disorder, in particular, concern for underlying obstructive sleep apnea.  The patient is unaccompanied today.  As you know, [name] is a 53 year old female with an underlying medical history of diabetes, hypertension, hyperlipidemia, squamous cell cancer, history of pancreatitis, allergies, arthritis, thyroid  disease, cataracts, degenerative cervical disc disease with status post neck surgery, and mild obesity, who reports snoring and excessive daytime somnolence.  She does not wake up fully rested, she has had elevated blood sugar values first thing in the morning and there is concern for sleep disordered breathing.  Her mom has sleep apnea.  Patient is familiar with the diagnoses as her husband also has a PAP machine.  She lives with her family including husband, 22 year old daughter and 2 great Danes.  One of the dog sleeps in her room.  She does sleep with the TV on typically.  She goes to bed around 930 and falls asleep around 10:30 PM.  She has a rise time of 6:30 AM.  She works part-time as a Financial Planner.  She drinks caffeine occasionally, not daily.  She drinks alcohol about once a week, up to 4 drinks.  She quit smoking in 2007.  She had weight gain after she stopped her metformin.  She has plateaued since then.  She is working on weight loss.  She had a tonsillectomy at age 27.  I reviewed your office note from 06/27/2024.  She had blood work through your office on 06/20/2024 and I reviewed test results in her paper record.  A1c was 7.4 at the time.  She has nocturia about once per average night, denies recurrent nocturnal or morning headaches.   She has a history of bruxism and uses a dentist made bite guard for the past year and a half or 2 years even.  Her Past Medical History Is Significant For: Past Medical History:  Diagnosis Date   Acute pancreatitis    Allergy     seasonal    Arthritis    2021 right hip   Cataract    2020   COVID    Diabetes (HCC)    Gallstones    Hyperlipidemia    Hypertension    Pneumonia    Squamous cell carcinoma    skin-sx on 2021   Thyroid  disease    2016    Her Past Surgical History Is Significant For: Past Surgical History:  Procedure Laterality Date   CATARACT EXTRACTION  2020   CERVICAL SPINE SURGERY     CESAREAN SECTION  2008   CHOLECYSTECTOMY  2005   HIP ARTHROSCOPY Right 2011, 2016   KNEE ARTHROSCOPY Right 1988   SKIN CANCER EXCISION  2021   TONSILECTOMY, ADENOIDECTOMY, BILATERAL MYRINGOTOMY AND TUBES  1989   TONSILLECTOMY  1989   WISDOM TOOTH EXTRACTION  1990    Her Family History Is Significant For: Family History  Problem Relation Age of Onset   Heart disease Mother    Basal cell carcinoma Mother    Sleep apnea Mother    Diabetes Father    Hyperlipidemia Father    Hypertension Father    Colon polyps Father 35  Prostate cancer Father 39   Melanoma Father    Aneurysm Father    AAA (abdominal aortic aneurysm) Father 58       Thoracic aorta repair and AVR   Alcohol abuse Sister    Mental illness Sister    Asthma Sister    Urticaria Sister    Allergic rhinitis Sister    Heart attack Paternal Aunt    Melanoma Paternal Aunt    Breast cancer Maternal Grandmother    AAA (abdominal aortic aneurysm) Maternal Grandfather    Diabetes Paternal Grandmother    Stroke Paternal Grandfather    Melanoma Paternal Grandfather    Colon cancer Neg Hx    Esophageal cancer Neg Hx    Rectal cancer Neg Hx    Stomach cancer Neg Hx     Her Social History Is Significant For: Social History   Socioeconomic History   Marital status: Married    Spouse name: Not on file    Number of children: 1   Years of education: Not on file   Highest education level: Not on file  Occupational History   Occupation: physical therapy  Tobacco Use   Smoking status: Former    Current packs/day: 0.00    Average packs/day: 0.3 packs/day for 5.0 years (1.3 ttl pk-yrs)    Types: Cigarettes    Start date: 09/19/2000    Quit date: 09/19/2005    Years since quitting: 18.8   Smokeless tobacco: Never   Tobacco comments:    Quit 2007  Vaping Use   Vaping status: Never Used  Substance and Sexual Activity   Alcohol use: Yes    Alcohol/week: 4.0 standard drinks of alcohol    Types: 4 Shots of liquor per week    Comment: occassionally   Drug use: No   Sexual activity: Yes    Birth control/protection: I.U.D.  Other Topics Concern   Not on file  Social History Narrative   Pt lives with family   Pt works    Social Drivers of Corporate Investment Banker Strain: Not on [employer] Insecurity: Not on file  Transportation Needs: Not on file  Physical Activity: Not on file  Stress: Not on file  Social Connections: Not on file    Her Allergies Are:  Allergies  Allergen Reactions   Januvia [Sitagliptin] Other (See Comments)    Pancreatitis   Onglyza [Saxagliptin] Other (See Comments)    Pancreatitis to all DPP 4 inhibitor medications   Cherry Swelling and Itching   Lidocaine     Other reaction(s): Other (See Comments) Blisters   Peach [Prunus Persica] Other (See Comments)    Facial tingling   Saxagliptin-Metformin Er Other (See Comments)  :   Her Current Medications Are:  Outpatient Encounter Medications as of 08/01/2024  Medication Sig   aspirin  EC 81 MG tablet Take 1 tablet (81 mg total) by mouth daily. Swallow whole.   augmented betamethasone  dipropionate (DIPROLENE -AF) 0.05 % cream Apply topically as needed. scoroisis   cetirizine (ZYRTEC) 10 MG tablet Take 10 mg by mouth daily.   clobetasol (TEMOVATE) 0.05 % external solution Apply 1 Application topically as  needed.   clotrimazole -betamethasone  (LOTRISONE ) cream Apply to genital area twice daily if needed for external itching.   Continuous Glucose Sensor (DEXCOM G6 SENSOR) MISC Apply 1 each topically as directed.   Continuous Glucose Transmitter (DEXCOM G6 TRANSMITTER) MISC APPLY AS DIRECTED 90   EPINEPHrine  0.3 mg/0.3 mL IJ SOAJ injection Inject 0.3 mLs (0.3 mg  total) into the muscle as needed for anaphylaxis.   ezetimibe  (ZETIA ) 10 MG tablet Take 1 tablet (10 mg total) by mouth daily.   FLUoxetine  (PROZAC ) 40 MG capsule Take 1 capsule (40 mg total) by mouth daily.   fluticasone  (FLONASE ) 50 MCG/ACT nasal spray Place 1 spray into both nostrils daily for 3 days.   Glucagon (BAQSIMI ONE PACK) 3 MG/DOSE POWD as needed (low blood sugar).   glucose blood test strip Check 2 hour postprandial and fasting blood sugars daily; also if you feel poorly   HUMALOG 100 UNIT/ML injection Inject 50 Units into the skin daily. By insulin  pump   hydrochlorothiazide  (MICROZIDE ) 12.5 MG capsule TAKE 1 CAPSULE BY MOUTH EVERY DAY   Ibuprofen 200 MG CAPS Take 600 mg by mouth daily as needed (pain).   Insulin  Disposable Pump (OMNIPOD 5 DEXG7G6 PODS GEN 5) MISC SMARTSIG:3 SUB-Q Every 3 Days   Insulin  Human (INSULIN  PUMP) SOLN Dexcom omnipod 5, 150 units every 3 days   JARDIANCE 25 MG TABS tablet Take 25 mg by mouth daily.   levonorgestrel  (MIRENA ) 20 MCG/24HR IUD 1 each by Intrauterine route continuous.    levothyroxine (SYNTHROID) 88 MCG tablet Take 75 mcg by mouth daily before breakfast.   lisinopril  (PRINIVIL ,ZESTRIL ) 20 MG tablet Take 20 mg by mouth daily.   rosuvastatin  (CRESTOR ) 40 MG tablet Take 1 tablet (40 mg total) by mouth daily.   valACYclovir (VALTREX) 500 MG tablet Take 500 mg by mouth as needed. breakouts   No facility-administered encounter medications on file as of 08/01/2024.  :   Review of Systems:  Out of a complete 14 point review of systems, all are reviewed and negative with the exception of  these symptoms as listed below:  Review of Systems  Objective:  Neurological Exam  Physical Exam Physical Examination:   Vitals:   08/01/24 1509  BP: 125/74  Pulse: 79    General Examination: The patient is a very pleasant 53 y.o. female in no acute distress. She appears well-developed and well-nourished and well groomed.   HEENT: Normocephalic, atraumatic, pupils are equal, round and reactive to light, extraocular tracking is good without limitation to gaze excursion or nystagmus noted. No photophobia.  no Corrective eye glasses in place. Hearing is grossly intact.  Face is symmetric with normal facial animation. Speech is clear without dysarthria. There is no hypophonia. There is no lip, neck/head, jaw or voice tremor. Neck is supple with full range of passive and active motion. There are no carotid bruits on auscultation.  Airway/Oropharynx exam reveals: mild mouth dryness, good dental hygiene and mild airway crowding secondary to small airway entry and thicker tongue.  Mallampati class I.  Tonsils absent.  Neck circumference 16-1/4 inches, minimal overbite noted.  Tongue protrudes centrally at the palate elevates symmetrically.  On nasal inspection she has a mildly deviated septum to the right.   Chest: Clear to auscultation without wheezing, rhonchi or crackles noted.  Heart: S1+S2+0, regular and normal without murmurs, rubs or gallops noted.   Abdomen: Soft, non-tender and non-distended.  Extremities: There is no obvious edema in the distal lower extremities bilaterally.   Skin: Warm and dry without trophic changes noted.   Musculoskeletal: exam reveals no obvious joint deformities.   Neurologically:  Mental status: The patient is awake, alert and oriented in all 4 spheres. Her immediate and remote memory, attention, language skills and fund of knowledge are appropriate. There is no evidence of aphasia, agnosia, apraxia or anomia. Speech is clear with  normal prosody and  enunciation. Thought process is linear. Mood is normal and affect is normal.  Cranial nerves II - XII are as described above under HEENT exam.  Motor exam: Normal bulk, moving all 4 extremities without restriction, no obvious action or resting tremor.  Fine motor skills and coordination: Intact grossly.  Cerebellar testing: No dysmetria or intention tremor. There is no truncal or gait ataxia.  Sensory exam: intact to light touch in the upper and lower extremities.  Gait, station and balance: She stands easily. No veering to one side is noted. No leaning to one side is noted. Posture is age-appropriate and stance is narrow based. Gait shows normal stride length and normal pace. No problems turning are noted.   Assessment and Plan:  In summary, Mahina Salatino is a very pleasant 53 y.o.-year old female with an underlying medical history of diabetes, hypertension, hyperlipidemia, squamous cell cancer, history of pancreatitis, allergies, arthritis, thyroid  disease, cataracts, degenerative cervical disc disease with status post neck surgery, and mild obesity, whose history and physical exam are concerning for sleep disordered breathing, particularly obstructive sleep apnea (OSA). A laboratory attended sleep study is typically considered gold standard for evaluation of sleep disordered breathing.   I had a long chat with the patient about my findings and the diagnosis of sleep apnea, particularly OSA, its prognosis and treatment options. We talked about medical/conservative treatments, surgical interventions and non-pharmacological approaches for symptom control. I explained, in particular, the risks and ramifications of untreated moderate to severe OSA, especially with respect to developing cardiovascular disease down the road, including congestive heart failure (CHF), difficult to treat hypertension, cardiac arrhythmias (particularly A-fib), neurovascular complications including TIA, stroke and dementia. Even  type 2 diabetes has, in part, been linked to untreated OSA. Symptoms of untreated OSA may include (but may not be limited to) daytime sleepiness, nocturia (i.e. frequent nighttime urination), memory problems, mood irritability and suboptimally controlled or worsening mood disorder such as depression and/or anxiety, lack of energy, lack of motivation, physical discomfort, as well as recurrent headaches, especially morning or nocturnal headaches. We talked about the importance of maintaining a healthy lifestyle and striving for healthy weight. In addition, we talked about the importance of striving for and maintaining good sleep hygiene. I recommended a sleep study at this time. I outlined the differences between a laboratory attended sleep study which is considered more comprehensive and accurate over the option of a home sleep test (HST); the latter may lead to underestimation of sleep disordered breathing in some instances and does not help with diagnosing upper airway resistance syndrome and is not accurate enough to diagnose primary central sleep apnea typically. I outlined possible surgical and non-surgical treatment options of OSA, including the use of a positive airway pressure (PAP) device (i.e. CPAP, AutoPAP/APAP or BiPAP in certain circumstances), a custom-made dental device (aka oral appliance, which would require a referral to a specialist dentist or orthodontist typically, and is generally speaking not considered for patients with full dentures or edentulous state), upper airway surgical options, such as traditional UPPP (which is not considered a first-line treatment) or the Inspire device (hypoglossal nerve stimulator, which would involve a referral for consultation with an ENT surgeon, after careful selection, following inclusion criteria - also not first-line treatment). I explained the PAP treatment option to the patient in detail, as this is generally considered first-line treatment.  The patient  indicated that she would be willing to try PAP therapy, if the need arises. I explained the importance  of being compliant with PAP treatment, not only for insurance purposes but primarily to improve patient's symptoms symptoms, and for the patient's long term health benefit, including to reduce Her cardiovascular risks longer-term.    We will pick up our discussion about the next steps and treatment options after testing.  We will keep her posted as to the test results by phone call and/or MyChart messaging where possible.  We will plan to follow-up in sleep clinic accordingly as well.  I answered all her questions today and the patient was in agreement.   I encouraged her to call with any interim questions, concerns, problems or updates or email us  through MyChart.  Generally speaking, sleep test authorizations may take up to 2 weeks, sometimes less, sometimes longer, the patient is encouraged to get in touch with us  if they do not hear back from the sleep lab staff directly within the next 2 weeks.  Thank you very much for allowing me to participate in the care of this nice patient. If I can be of any further assistance to you please do not hesitate to call me at [phone].  Sincerely,   True Mar, MD, PhD

## 2024-08-01 NOTE — Patient Instructions (Signed)

## 2024-08-06 DIAGNOSIS — Z85828 Personal history of other malignant neoplasm of skin: Secondary | ICD-10-CM | POA: Diagnosis not present

## 2024-08-06 DIAGNOSIS — L820 Inflamed seborrheic keratosis: Secondary | ICD-10-CM | POA: Diagnosis not present

## 2024-08-06 DIAGNOSIS — L400 Psoriasis vulgaris: Secondary | ICD-10-CM | POA: Diagnosis not present

## 2024-08-06 DIAGNOSIS — D2222 Melanocytic nevi of left ear and external auricular canal: Secondary | ICD-10-CM | POA: Diagnosis not present

## 2024-08-06 DIAGNOSIS — L720 Epidermal cyst: Secondary | ICD-10-CM | POA: Diagnosis not present

## 2024-08-07 ENCOUNTER — Ambulatory Visit: Admitting: Pharmacist Clinician (PhC)/ Clinical Pharmacy Specialist

## 2024-08-12 ENCOUNTER — Ambulatory Visit: Admitting: Pharmacist

## 2024-08-27 DIAGNOSIS — Z6829 Body mass index (BMI) 29.0-29.9, adult: Secondary | ICD-10-CM | POA: Diagnosis not present

## 2024-08-27 DIAGNOSIS — Z01419 Encounter for gynecological examination (general) (routine) without abnormal findings: Secondary | ICD-10-CM | POA: Diagnosis not present

## 2024-08-27 DIAGNOSIS — Z124 Encounter for screening for malignant neoplasm of cervix: Secondary | ICD-10-CM | POA: Diagnosis not present

## 2024-08-27 DIAGNOSIS — R5383 Other fatigue: Secondary | ICD-10-CM | POA: Diagnosis not present

## 2024-08-27 DIAGNOSIS — Z1151 Encounter for screening for human papillomavirus (HPV): Secondary | ICD-10-CM | POA: Diagnosis not present

## 2024-08-30 ENCOUNTER — Ambulatory Visit

## 2024-08-30 DIAGNOSIS — G4733 Obstructive sleep apnea (adult) (pediatric): Secondary | ICD-10-CM

## 2024-08-30 DIAGNOSIS — R635 Abnormal weight gain: Secondary | ICD-10-CM

## 2024-08-30 DIAGNOSIS — G4719 Other hypersomnia: Secondary | ICD-10-CM

## 2024-08-30 DIAGNOSIS — Z9189 Other specified personal risk factors, not elsewhere classified: Secondary | ICD-10-CM

## 2024-08-30 DIAGNOSIS — Z820 Family history of epilepsy and other diseases of the nervous system: Secondary | ICD-10-CM

## 2024-08-30 DIAGNOSIS — R739 Hyperglycemia, unspecified: Secondary | ICD-10-CM

## 2024-08-30 DIAGNOSIS — G4763 Sleep related bruxism: Secondary | ICD-10-CM

## 2024-08-30 DIAGNOSIS — R351 Nocturia: Secondary | ICD-10-CM

## 2024-08-30 DIAGNOSIS — R0683 Snoring: Secondary | ICD-10-CM

## 2024-08-30 DIAGNOSIS — E66811 Obesity, class 1: Secondary | ICD-10-CM

## 2024-08-30 DIAGNOSIS — R7309 Other abnormal glucose: Secondary | ICD-10-CM

## 2024-09-05 NOTE — Progress Notes (Unsigned)
 SABRA

## 2024-09-10 ENCOUNTER — Ambulatory Visit: Payer: Self-pay | Admitting: Neurology

## 2024-09-10 DIAGNOSIS — G4733 Obstructive sleep apnea (adult) (pediatric): Secondary | ICD-10-CM

## 2024-09-10 NOTE — Procedures (Signed)
 "   [facility]  HOME SLEEP TEST (SANSA) REPORT (Mail-Out Device):   STUDY DATE: 09/02/2024  DOB: 1971/02/19  MRN: [id number]  ORDERING CLINICIAN: True Mar, MD, PhD   REFERRING CLINICIAN: Dr. Littie Caffey  CLINICAL INFORMATION/HISTORY (obtained from visit note dated 08/01/2024): 53 year old female with an underlying medical history of diabetes, hypertension, hyperlipidemia, squamous cell cancer, history of pancreatitis, allergies, arthritis, thyroid  disease, cataracts, degenerative cervical disc disease with status post neck surgery, and mild obesity, who reports snoring and excessive daytime somnolence. She does not wake up fully rested, she has had elevated blood sugar values first thing in the morning and there is concern for sleep disordered breathing. Her mom has sleep apnea.    BMI (at the time of sleep clinic visit and/or test date): 30 kg/m  FINDINGS:   Study Protocol:    The SANSA single-point-of-skin-contact chest-worn sensor - an FDA cleared and DOT approved type 4 home sleep test device - measures eight physiological channels,  including blood oxygen saturation (measured via PPG [photoplethysmography]), EKG-derived heart rate, respiratory effort, chest movement (measured via accelerometer), snoring, body position, and actigraphy. The device is designed to be worn for up to 10 hours per study.   Sleep Summary:   Total Recording Time (hours, min): 7 hours, 58 min  Total Effective Sleep Time (hours, min):  6 hours, 13 min  Sleep Efficiency (%):    78%   Respiratory Indices:   Calculated sAHI (per hour):  6.1/hour        Calculated sAHIc (central AHI per hour):  0/hour  Oxygen Saturation Statistics:    Oxygen Saturation (%) Mean: 94.7%   Minimum oxygen saturation (%):                 82%   O2 Saturation Range (%): 82-100%   Time below or at 88% saturation: 0 min   Pulse Rate Statistics:   Pulse Mean (bpm):    65/min    Pulse Range  (55-102/min)   Snoring: Mild, intermittent  IMPRESSION/DIAGNOSES:   OSA (obstructive sleep apnea), mild   RECOMMENDATIONS:   This home sleep test demonstrates overall mild obstructive sleep apnea with a total AHI of 6.1/hour and O2 nadir of 82%. Snoring was detected, intermittently, in the mild range. Given the patient's medical history and sleep related complaints, therapy with a positive airway pressure device is a reasonable first-line choice and clinically recommended. Treatment can be achieved in the form of autoPAP trial/titration at home for now. A full night, in-lab PAP titration study may aid in improving proper treatment settings and with mask fit, if needed, down the road.  Alternative treatments may include weight loss (where appropriate) along with avoidance of the supine sleep position (if possible), or an oral appliance in appropriate candidates.   Please note that untreated obstructive sleep apnea may carry additional perioperative morbidity. Patients with significant obstructive sleep apnea should receive perioperative PAP therapy and the surgeons and particularly the anesthesiologist should be informed of the diagnosis and the severity of the sleep disordered breathing. The patient should be cautioned not to drive, work at heights, or operate dangerous or heavy equipment when tired or sleepy. Review and reiteration of good sleep hygiene measures should be pursued with any patient. Other causes of the patient's symptoms, including circadian rhythm disturbances, an underlying mood disorder, medication effect and/or an underlying medical problem cannot be ruled out based on this test. Clinical correlation is recommended.  The patient and her referring provider will be  notified of the test results. The patient will be seen in follow up in sleep clinic at [facility], as necessary.  I certify that I have reviewed the raw data recording prior to the issuance of this report in accordance with  the standards of the American Academy of Sleep Medicine (AASM).    INTERPRETING PHYSICIAN:   True Mar, MD, PhD Medical Director, [facility]) Diplomat, ABPN (Neurology and Sleep)   [facility] [address] [place], [place] [address] [phone]          "

## 2024-09-10 NOTE — Telephone Encounter (Signed)
 Spoke to patient gave sleep study results Pt wanted to discuss other options besides cpap machine to treat sleep apnea Informed pt will forward questions to Dr Buck to get her recommendations. Pt thanked me for calling

## 2024-09-10 NOTE — Telephone Encounter (Signed)
 Pt returned call. Please call back when available.

## 2024-09-10 NOTE — Telephone Encounter (Signed)
-----   Message from True Mar, MD sent at 09/10/2024  2:42 PM EST ----- Patient referred by Dr. Tommas, seen by me on 08/01/24, patient had HST on 09/02/24.    Please call and notify the patient that the recent home sleep test showed obstructive sleep apnea. OSA is overall mild, but worth treating to see if she feels better after treatment. To that end I  recommend treatment for this in the form of autoPAP, which means, that we don't have to bring her in for a sleep study with CPAP, but will let her try an autoPAP machine at home, through a DME  company (of her choice, or as per insurance requirement). The DME representative will educate her on how to use the machine, how to put the mask on, etc. I have placed an order in the chart. Please  send referral, talk to patient, send report to referring MD. We will need a FU in sleep clinic in about 2.-3 months post-PAP set up (which is usually an insurance-mandated appointment to monitor  compliance), please arrange that with me or one of our NPs. Please also go over the need for compliance with treatment (including the insurance-imposed minimum compliance percentage). Thanks,   True Mar, MD, PhD [facility])

## 2024-09-11 NOTE — Telephone Encounter (Signed)
 Auto-pap machine order has not been sent to DME at this time due to patient concerns listed below

## 2024-09-17 NOTE — Telephone Encounter (Signed)
 For mild obstructive sleep apnea we can consider a mouth appliance through dentistry.  Let me know if she would like a referral to a dentist. Avoiding the back sleep position and some degree of weight loss may also improve her mild sleep apnea.

## 2024-09-23 NOTE — Telephone Encounter (Signed)
 Patient plan to discuss this with her dentist. She stated they did have a conversation about it in the past with him, but if he is unable to do it, she will call back to get a referral sent in by Dr. Buck.

## 2024-09-24 DIAGNOSIS — E1169 Type 2 diabetes mellitus with other specified complication: Secondary | ICD-10-CM

## 2024-09-24 DIAGNOSIS — I152 Hypertension secondary to endocrine disorders: Secondary | ICD-10-CM

## 2024-09-24 DIAGNOSIS — R072 Precordial pain: Secondary | ICD-10-CM

## 2024-09-24 DIAGNOSIS — R079 Chest pain, unspecified: Secondary | ICD-10-CM

## 2024-09-24 DIAGNOSIS — Z8249 Family history of ischemic heart disease and other diseases of the circulatory system: Secondary | ICD-10-CM

## 2024-09-24 DIAGNOSIS — R0609 Other forms of dyspnea: Secondary | ICD-10-CM

## 2024-09-24 MED ORDER — HYDROCHLOROTHIAZIDE 12.5 MG PO CAPS: 12.5000 mg | ORAL_CAPSULE | Freq: Every day | ORAL | 1 refills | Status: AC

## 2024-09-27 ENCOUNTER — Ambulatory Visit: Attending: Cardiology

## 2024-09-27 ENCOUNTER — Telehealth: Payer: Self-pay

## 2024-09-27 DIAGNOSIS — E1169 Type 2 diabetes mellitus with other specified complication: Secondary | ICD-10-CM

## 2024-09-27 NOTE — Progress Notes (Signed)
 Patient ID: Jessica Chang                 DOB: April 17, 1971                    MRN: [id number]      HPI: Javon Hupfer is a 54 y.o. female patient referred to lipid clinic by Dr. Santo. PMH is significant for HTN, T2DM, HLD, and mild obstructive CAD.  The patient was last evaluated by Dr. Santo in June 2025 during a follow-up visit. At that time, the LDL level was above target at 91 mg/dL and has since increased to 94 mg/dL. Ezetimibe  was initiated during that visit, with plans to recheck the lipid panel in three months. Follow up lipid panel revealed LDL of 88 so PharmD referral placed to discuss PCSK9i therapy. Bempedoic acid will be avoided due to the patients family history of aortopathy.   The patient presents today in good spirits. She is currently taking rosuvastatin  40 mg daily and ezetimibe  10 mg daily for hyperlipidemia management and is tolerating both medications without adverse effects. She reports experiencing myalgias but does not attribute them to statin therapy, noting that symptoms persisted even after discontinuing the statin previously. Liver function tests are elevated at approximately twice the upper limit of normal. The patient reports a history of TB exposure and prior TB treatment, which previously caused significant liver enzyme elevations (up to the 400s); current levels are significantly lower in comparison.  Reviewed options for lowering LDL cholesterol, including PCSK-9 inhibitors. Discussed mechanisms of action, dosing, side effects and potential decreases in LDL cholesterol.  Also reviewed cost information and potential options for patient assistance.   Current Medications: rosuvastatin  40 mg daily and ezetimibe  10 mg daily  Intolerances: none Risk Factors: HTN, T2DM, mild obstructive CAD LDL goal: < 70 Lipid panel (06/2024): Chol 153, Trig 131, HDL 42, LDL 88 Liver enzymes (06/2024): ALT 79, AST 50, Alk phos 61  Diet: Additionally, She reports  restarting [employer] program for weight loss and that her blood sugars have improved as a result.   Consumes a significant amount of cheese, dairy products, eggs, and toast. Breakfast: Typically includes yogurt with granola. Lunch/Dinner: Prefers baked or grilled protein with vegetables; rarely eats fried foods. Snacks: Primarily nuts. Beverages: Drinks approximately 1 gallon of water daily  Exercise:  Reports she exercises less than she should  Family History:   Relation Problem Comments  Mother (Alive) Basal cell carcinoma   Heart disease   Sleep apnea     Father Metallurgist) AAA (abdominal aortic aneurysm) (Age: 29) Thoracic aorta repair and AVR  Aneurysm   Colon polyps (Age: 56)   Diabetes   Hyperlipidemia   Hypertension   Melanoma   Prostate cancer (Age: 90)     Sister Metallurgist) Alcohol abuse   Allergic rhinitis   Asthma   Mental illness   Urticaria     Brother Metallurgist)   Paternal Aunt (Deceased) Heart attack   Melanoma     Maternal Grandmother (Deceased) Breast cancer     Maternal Grandfather (Deceased) AAA (abdominal aortic aneurysm)     Paternal Grandmother (Deceased) Diabetes     Paternal Grandfather (Deceased) Melanoma   Stroke     Daughter (Alive)   Neg Hx Colon cancer   Esophageal cancer   Rectal cancer   Stomach cancer      Social History:  Alcohol: rarely  Smoking: none   Labs:  Lipid Panel     Component Value  Date/Time   CHOL 165 08/09/2023 0000   CHOL 180 08/01/2022 0815   CHOL 174 12/08/2021 0000   TRIG 202 (A) 08/09/2023 0000   TRIG 221 (A) 12/08/2021 0000   HDL 39 08/09/2023 0000   HDL 52 08/01/2022 0815   HDL 43 12/08/2021 0000   CHOLHDL 3.5 08/01/2022 0815   CHOLHDL 4.0 05/20/2016 0815   VLDL 28 05/20/2016 0815   LDLCALC 91 08/09/2023 0000   LDLCALC 106 (H) 08/01/2022 0815   LABVLDL 22 08/01/2022 0815    Past Medical History:  Diagnosis Date   Acute pancreatitis    Allergy     seasonal    Arthritis    2021 right hip    Cataract    2020   COVID    Diabetes (HCC)    Gallstones    Hyperlipidemia    Hypertension    Pneumonia    Squamous cell carcinoma    skin-sx on 2021   Thyroid  disease    2016    Medications Ordered Prior to Encounter[1]  Allergies[2]  Assessment/Plan:  1. Hyperlipidemia -  Problem  Hyperlipidemia Associated With Type 2 Diabetes Mellitus (Hcc)   Hyperlipidemia associated with type 2 diabetes mellitus (HCC) Assessment:  LDL goal: < 70  mg/dl; last LDLc 88 mg/dl (89/7974) on rosuvastatin  40 mg daily and ezetimibe  10 mg daily Tolerates current regimen well without any side effects Liver enzymes (06/2024): ALT 79, AST 50, Alk phos 61; ALT~ 2 times ULN though patient has hx of elevated liver enzymes post TB therapy Discussed next potential options (PCSK-9 inhibitors); cost, dosing efficacy, side effects Patient willing to proceed with PCSK9i therapy  Provided and discussed Planning Healthy Meals handout and encouraged 150 minutes of moderate intensity aerobic exercise weekly  Plan: Continue rosuvastatin  40 mg daily and ezetimibe  10 mg daily Will apply for PA for PCSK9i; will inform patient upon approval  May consider discontinuing statin therapy in the future if the patient responds well to PCSK9 inhibitor, given history of elevated liver enzymes. Lipid lab and hepatic panel due in 3 months after starting PCSK9i    Thank you,  Gabriella Woodhead E. Roshini Fulwider, Pharm.D, CPP [place] [facility] [address], [place], [place] [address] Phone: [phone]; Fax: [phone]        [1]  Current Outpatient Medications on File Prior to Visit  Medication Sig Dispense Refill   aspirin  EC 81 MG tablet Take 1 tablet (81 mg total) by mouth daily. Swallow whole. 30 tablet 11   augmented betamethasone  dipropionate (DIPROLENE -AF) 0.05 % cream Apply topically as needed. scoroisis     cetirizine (ZYRTEC) 10 MG tablet Take 10 mg by mouth daily.      clobetasol (TEMOVATE) 0.05 % external solution Apply 1 Application topically as needed.     clotrimazole -betamethasone  (LOTRISONE ) cream Apply to genital area twice daily if needed for external itching. 60 g 0   Continuous Glucose Sensor (DEXCOM G6 SENSOR) MISC Apply 1 each topically as directed.     Continuous Glucose Transmitter (DEXCOM G6 TRANSMITTER) MISC APPLY AS DIRECTED 90     EPINEPHrine  0.3 mg/0.3 mL IJ SOAJ injection Inject 0.3 mLs (0.3 mg total) into the muscle as needed for anaphylaxis. 2 each 1   ezetimibe  (ZETIA ) 10 MG tablet Take 1 tablet (10 mg total) by mouth daily. 90 tablet 3   FLUoxetine  (PROZAC ) 40 MG capsule Take 1 capsule (40 mg total) by mouth daily. 90 capsule 1  fluticasone  (FLONASE ) 50 MCG/ACT nasal spray Place 1 spray into both nostrils daily for 3 days. 16 g 0   Glucagon (BAQSIMI ONE PACK) 3 MG/DOSE POWD as needed (low blood sugar).     glucose blood test strip Check 2 hour postprandial and fasting blood sugars daily; also if you feel poorly 100 each 12   HUMALOG 100 UNIT/ML injection Inject 50 Units into the skin daily. By insulin  pump     hydrochlorothiazide  (MICROZIDE ) 12.5 MG capsule Take 1 capsule (12.5 mg total) by mouth daily. 90 capsule 1   Ibuprofen 200 MG CAPS Take 600 mg by mouth daily as needed (pain).     Insulin  Disposable Pump (OMNIPOD 5 DEXG7G6 PODS GEN 5) MISC SMARTSIG:3 SUB-Q Every 3 Days     Insulin  Human (INSULIN  PUMP) SOLN Dexcom omnipod 5, 150 units every 3 days     JARDIANCE 25 MG TABS tablet Take 25 mg by mouth daily.     levonorgestrel  (MIRENA ) 20 MCG/24HR IUD 1 each by Intrauterine route continuous.      levothyroxine (SYNTHROID) 88 MCG tablet Take 75 mcg by mouth daily before breakfast.     lisinopril  (PRINIVIL ,ZESTRIL ) 20 MG tablet Take 20 mg by mouth daily.  6   rosuvastatin  (CRESTOR ) 40 MG tablet Take 1 tablet (40 mg total) by mouth daily. 90 tablet 3   valACYclovir (VALTREX) 500 MG tablet Take 500 mg by mouth as needed. breakouts      No current facility-administered medications on file prior to visit.  [2]  Allergies Allergen Reactions   Januvia [Sitagliptin] Other (See Comments)    Pancreatitis   Onglyza [Saxagliptin] Other (See Comments)    Pancreatitis to all DPP 4 inhibitor medications   Cherry Swelling and Itching   Lidocaine     Other reaction(s): Other (See Comments) Blisters   Peach [Prunus Persica] Other (See Comments)    Facial tingling   Saxagliptin-Metformin Er Other (See Comments)

## 2024-09-27 NOTE — Assessment & Plan Note (Addendum)
 Assessment:  LDL goal: < 70  mg/dl; last LDLc 88 mg/dl (89/7974) on rosuvastatin  40 mg daily and ezetimibe  10 mg daily Tolerates current regimen well without any side effects Liver enzymes (06/2024): ALT 79, AST 50, Alk phos 61; ALT~ 2 times ULN though patient has hx of elevated liver enzymes post TB therapy Discussed next potential options (PCSK-9 inhibitors); cost, dosing efficacy, side effects Patient willing to proceed with PCSK9i therapy  Provided and discussed Planning Healthy Meals handout and encouraged 150 minutes of moderate intensity aerobic exercise weekly  Plan: Continue rosuvastatin  40 mg daily and ezetimibe  10 mg daily Will apply for PA for PCSK9i; will inform patient upon approval  May consider discontinuing statin therapy in the future if the patient responds well to PCSK9 inhibitor, given history of elevated liver enzymes. Lipid lab and hepatic panel due in 3 months after starting PCSK9i

## 2024-09-27 NOTE — Patient Instructions (Signed)
 Your Results:             Your most recent labs Goal  Total Cholesterol 153 < 200  Triglycerides 131 < 150  HDL (happy/good cholesterol) 42 > 40  LDL (lousy/bad cholesterol 88 < 70   Medication changes: Continue rosuvastatin  40 mg daily and ezetimibe  10 mg daily  We will start the process to get Repatha  or Praluent covered by your insurance.  Once the prior authorization is complete, we will call you to let you know and confirm pharmacy information.    Lab orders:We want to repeat labs 3 months after starting PCSK9i.  We will send you a lab order to remind you once we get        closer to that time.    Tavi Hoogendoorn E. Chantae Soo, Pharm.D, CPP [place] [facility]. [facility] [address], [place], [place] [address] Phone: [phone]; Fax: [phone]     Praluent is a cholesterol medication that improved your body's ability to get rid of bad cholesterol known as LDL. It can lower your LDL up to 60%. It is an injection that is given under the skin every 2 weeks. The most common side effects of Praluent include runny nose, symptoms of the common cold, rarely flu or flu-like symptoms, back/muscle pain in about 3-4% of the patients, and redness, pain, or bruising at the injection site.   Repatha  is a cholesterol medication that improved your body's ability to get rid of bad cholesterol known as LDL. It can lower your LDL up to 60%! It is an injection that is given under the skin every 2 weeks. The medication often requires a prior authorization from your insurance company. We will take care of submitting all the necessary information to your insurance company to get it approved. The most common side effects of Repatha  include runny nose, symptoms of the common cold, rarely flu or flu-like symptoms, back/muscle pain in about 3-4% of the patients, and redness, pain, or bruising at the injection site.    It is also recommended that patients with high cholesterol  adhere to a heart healthy diet, get regular exercise, avoid use of tobacco products, and maintain a healthy weight. Steps that you can take to help in these areas:  Limit consumption of trans fats, saturated fats, and cholesterol in your diet  Increase intake of lean meats such as chicken, turkey, and fish  Increase intake of foods rich in fiber such as fresh fruits, vegetables, beans and oatmeal Exercise as you are able; even 30 minutes of walking daily can aid in increasing heart health

## 2024-09-30 ENCOUNTER — Other Ambulatory Visit (HOSPITAL_COMMUNITY): Payer: Self-pay

## 2024-09-30 ENCOUNTER — Telehealth: Payer: Self-pay | Admitting: Pharmacy Technician

## 2024-09-30 DIAGNOSIS — I2510 Atherosclerotic heart disease of native coronary artery without angina pectoris: Secondary | ICD-10-CM

## 2024-09-30 DIAGNOSIS — I77819 Aortic ectasia, unspecified site: Secondary | ICD-10-CM

## 2024-09-30 DIAGNOSIS — Z8249 Family history of ischemic heart disease and other diseases of the circulatory system: Secondary | ICD-10-CM

## 2024-09-30 DIAGNOSIS — Z79899 Other long term (current) drug therapy: Secondary | ICD-10-CM

## 2024-09-30 DIAGNOSIS — E1169 Type 2 diabetes mellitus with other specified complication: Secondary | ICD-10-CM

## 2024-09-30 MED ORDER — ROSUVASTATIN CALCIUM 40 MG PO TABS: 40.0000 mg | ORAL_TABLET | Freq: Every day | ORAL | 3 refills | Status: AC

## 2024-09-30 MED ORDER — REPATHA SURECLICK 140 MG/ML ~~LOC~~ SOAJ: 140.0000 mg | SUBCUTANEOUS | 3 refills | Status: AC

## 2024-09-30 NOTE — Telephone Encounter (Signed)
 Please see other encounter.

## 2024-09-30 NOTE — Telephone Encounter (Signed)
" ° °  Ran test claim for REPATHA . For a 28 day supply and the co-pay is 50.00 . PA is not needed at this time. This test claim was processed through [facility]- copay amounts may vary at other pharmacies due to pharmacy/plan contracts, or as the patient moves through the different stages of their insurance plan.    "

## 2024-09-30 NOTE — Telephone Encounter (Signed)
 Spoke with patient and discussed Repatha  copay and recommended she obtain copay card online to lower cost; will send rx to [facility] as requested as well as rosuvastatin  rx.

## 2024-09-30 NOTE — Addendum Note (Signed)
 Addended by: Reginia Battie E on: 09/30/2024 05:03 PM   Modules accepted: Orders

## 2024-10-16 ENCOUNTER — Other Ambulatory Visit: Payer: Self-pay | Admitting: Family Medicine

## 2024-10-16 DIAGNOSIS — F411 Generalized anxiety disorder: Secondary | ICD-10-CM

## 2024-10-21 ENCOUNTER — Other Ambulatory Visit: Payer: Self-pay

## 2024-10-21 ENCOUNTER — Telehealth: Payer: Self-pay | Admitting: *Deleted

## 2024-10-21 DIAGNOSIS — E119 Type 2 diabetes mellitus without complications: Secondary | ICD-10-CM

## 2024-10-21 DIAGNOSIS — E663 Overweight: Secondary | ICD-10-CM

## 2024-10-21 DIAGNOSIS — I152 Hypertension secondary to endocrine disorders: Secondary | ICD-10-CM

## 2024-10-21 DIAGNOSIS — E559 Vitamin D deficiency, unspecified: Secondary | ICD-10-CM

## 2024-10-21 DIAGNOSIS — E1169 Type 2 diabetes mellitus with other specified complication: Secondary | ICD-10-CM

## 2024-10-21 NOTE — Telephone Encounter (Signed)
 Copied from CRM [id number]. Topic: Appointments - Scheduling Inquiry for Clinic >> Oct 21, 2024  3:41 PM Ivette P wrote: Reason for CRM: Pt called in to see about getting lab appt rescheduled but was not able to find anything that fits with scheudled. Pls call pt to rescheduled lab or wants to know if lab can be drawn somewhere else.   As pt has her own pts to see.    ----------------------------------------------------------------------- From previous Reason for Contact - Cancel/Reschedule: Patient/patient representative is calling to cancel or reschedule an appointment. Refer to attachments for appointment information.

## 2024-10-21 NOTE — Telephone Encounter (Signed)
 Tried to contact pt to reschedule lab appt due to office not opening until 10 am. I have tried her back and unable to reach her.  She can come in tomorrow as early as 10 to have labs otherwise we will need to reschedule.

## 2024-10-22 ENCOUNTER — Other Ambulatory Visit

## 2024-10-29 ENCOUNTER — Encounter
